# Patient Record
Sex: Male | Born: 1960 | Race: Black or African American | Hispanic: No | Marital: Married | Smoking: Current every day smoker
Health system: Southern US, Community
[De-identification: ages and names within clinical notes are randomized; demographics above are authoritative.]

## PROBLEM LIST (undated history)

## (undated) DIAGNOSIS — N400 Enlarged prostate without lower urinary tract symptoms: Secondary | ICD-10-CM

## (undated) DIAGNOSIS — G43909 Migraine, unspecified, not intractable, without status migrainosus: Secondary | ICD-10-CM

## (undated) DIAGNOSIS — I219 Acute myocardial infarction, unspecified: Secondary | ICD-10-CM

## (undated) DIAGNOSIS — R569 Unspecified convulsions: Secondary | ICD-10-CM

## (undated) DIAGNOSIS — Z789 Other specified health status: Secondary | ICD-10-CM

## (undated) DIAGNOSIS — I519 Heart disease, unspecified: Secondary | ICD-10-CM

## (undated) DIAGNOSIS — F1010 Alcohol abuse, uncomplicated: Secondary | ICD-10-CM

## (undated) HISTORY — PX: CARDIAC CATHETERIZATION: SHX172

---

## 1998-03-09 ENCOUNTER — Emergency Department (HOSPITAL_COMMUNITY): Admission: EM | Admit: 1998-03-09 | Discharge: 1998-03-09 | Payer: Self-pay | Admitting: Emergency Medicine

## 1999-01-05 ENCOUNTER — Emergency Department (HOSPITAL_COMMUNITY): Admission: EM | Admit: 1999-01-05 | Discharge: 1999-01-05 | Payer: Self-pay | Admitting: Emergency Medicine

## 2000-03-29 ENCOUNTER — Emergency Department (HOSPITAL_COMMUNITY): Admission: EM | Admit: 2000-03-29 | Discharge: 2000-03-29 | Payer: Self-pay

## 2000-03-31 ENCOUNTER — Emergency Department (HOSPITAL_COMMUNITY): Admission: EM | Admit: 2000-03-31 | Discharge: 2000-03-31 | Payer: Self-pay | Admitting: Emergency Medicine

## 2000-07-27 ENCOUNTER — Encounter: Payer: Self-pay | Admitting: Emergency Medicine

## 2000-07-27 ENCOUNTER — Emergency Department (HOSPITAL_COMMUNITY): Admission: EM | Admit: 2000-07-27 | Discharge: 2000-07-27 | Payer: Self-pay | Admitting: Emergency Medicine

## 2001-03-25 ENCOUNTER — Ambulatory Visit (HOSPITAL_COMMUNITY): Admission: RE | Admit: 2001-03-25 | Discharge: 2001-03-25 | Payer: Self-pay | Admitting: Infectious Diseases

## 2001-03-25 ENCOUNTER — Encounter: Payer: Self-pay | Admitting: Infectious Diseases

## 2002-04-24 ENCOUNTER — Emergency Department (HOSPITAL_COMMUNITY): Admission: EM | Admit: 2002-04-24 | Discharge: 2002-04-24 | Payer: Self-pay | Admitting: Emergency Medicine

## 2002-07-22 ENCOUNTER — Emergency Department (HOSPITAL_COMMUNITY): Admission: EM | Admit: 2002-07-22 | Discharge: 2002-07-22 | Payer: Self-pay | Admitting: Emergency Medicine

## 2002-10-27 ENCOUNTER — Emergency Department (HOSPITAL_COMMUNITY): Admission: EM | Admit: 2002-10-27 | Discharge: 2002-10-27 | Payer: Self-pay | Admitting: Emergency Medicine

## 2003-07-25 ENCOUNTER — Emergency Department (HOSPITAL_COMMUNITY): Admission: EM | Admit: 2003-07-25 | Discharge: 2003-07-25 | Payer: Self-pay | Admitting: Emergency Medicine

## 2004-04-08 ENCOUNTER — Emergency Department (HOSPITAL_COMMUNITY): Admission: EM | Admit: 2004-04-08 | Discharge: 2004-04-08 | Payer: Self-pay | Admitting: Emergency Medicine

## 2004-10-23 ENCOUNTER — Emergency Department (HOSPITAL_COMMUNITY): Admission: EM | Admit: 2004-10-23 | Discharge: 2004-10-23 | Payer: Self-pay | Admitting: Emergency Medicine

## 2005-07-21 ENCOUNTER — Emergency Department (HOSPITAL_COMMUNITY): Admission: EM | Admit: 2005-07-21 | Discharge: 2005-07-21 | Payer: Self-pay | Admitting: Emergency Medicine

## 2005-08-28 ENCOUNTER — Inpatient Hospital Stay (HOSPITAL_COMMUNITY): Admission: EM | Admit: 2005-08-28 | Discharge: 2005-08-29 | Payer: Self-pay | Admitting: Emergency Medicine

## 2005-08-30 ENCOUNTER — Emergency Department (HOSPITAL_COMMUNITY): Admission: EM | Admit: 2005-08-30 | Discharge: 2005-08-30 | Payer: Self-pay | Admitting: Emergency Medicine

## 2005-10-05 ENCOUNTER — Emergency Department (HOSPITAL_COMMUNITY): Admission: EM | Admit: 2005-10-05 | Discharge: 2005-10-05 | Payer: Self-pay | Admitting: Emergency Medicine

## 2005-12-02 ENCOUNTER — Ambulatory Visit: Payer: Self-pay | Admitting: Nurse Practitioner

## 2007-01-12 ENCOUNTER — Ambulatory Visit: Payer: Self-pay | Admitting: Nurse Practitioner

## 2007-01-25 ENCOUNTER — Ambulatory Visit: Payer: Self-pay | Admitting: Nurse Practitioner

## 2007-02-22 ENCOUNTER — Emergency Department (HOSPITAL_COMMUNITY): Admission: EM | Admit: 2007-02-22 | Discharge: 2007-02-22 | Payer: Self-pay | Admitting: Emergency Medicine

## 2007-11-29 ENCOUNTER — Emergency Department (HOSPITAL_COMMUNITY): Admission: EM | Admit: 2007-11-29 | Discharge: 2007-11-29 | Payer: Self-pay | Admitting: Emergency Medicine

## 2009-01-14 ENCOUNTER — Emergency Department (HOSPITAL_COMMUNITY): Admission: EM | Admit: 2009-01-14 | Discharge: 2009-01-14 | Payer: Self-pay | Admitting: Emergency Medicine

## 2009-01-21 ENCOUNTER — Encounter (INDEPENDENT_AMBULATORY_CARE_PROVIDER_SITE_OTHER): Payer: Self-pay | Admitting: Internal Medicine

## 2009-01-21 ENCOUNTER — Ambulatory Visit: Payer: Self-pay | Admitting: Internal Medicine

## 2009-01-21 LAB — CONVERTED CEMR LAB
Basophils Absolute: 0 10*3/uL (ref 0.0–0.1)
Basophils Relative: 0 % (ref 0–1)
Eosinophils Absolute: 0.2 10*3/uL (ref 0.0–0.7)
Eosinophils Relative: 2 % (ref 0–5)
HCT: 45.1 % (ref 39.0–52.0)
Hemoglobin: 15.6 g/dL (ref 13.0–17.0)
Lymphocytes Relative: 37 % (ref 12–46)
Lymphs Abs: 2.8 10*3/uL (ref 0.7–4.0)
MCHC: 34.6 g/dL (ref 30.0–36.0)
MCV: 91.5 fL (ref 78.0–100.0)
Monocytes Absolute: 0.4 10*3/uL (ref 0.1–1.0)
Monocytes Relative: 6 % (ref 3–12)
Neutro Abs: 4.3 10*3/uL (ref 1.7–7.7)
Neutrophils Relative %: 55 % (ref 43–77)
Platelets: 194 10*3/uL (ref 150–400)
RBC: 4.93 M/uL (ref 4.22–5.81)
RDW: 15 % (ref 11.5–15.5)
WBC: 7.7 10*3/uL (ref 4.0–10.5)

## 2009-01-24 ENCOUNTER — Ambulatory Visit: Payer: Self-pay | Admitting: *Deleted

## 2009-02-20 ENCOUNTER — Ambulatory Visit: Payer: Self-pay | Admitting: Internal Medicine

## 2010-04-09 ENCOUNTER — Emergency Department (HOSPITAL_COMMUNITY): Admission: EM | Admit: 2010-04-09 | Discharge: 2010-04-09 | Payer: Self-pay | Admitting: Emergency Medicine

## 2012-12-18 ENCOUNTER — Encounter (HOSPITAL_COMMUNITY): Payer: Self-pay | Admitting: *Deleted

## 2012-12-18 ENCOUNTER — Emergency Department (HOSPITAL_COMMUNITY): Payer: Self-pay

## 2012-12-18 ENCOUNTER — Emergency Department (HOSPITAL_COMMUNITY)
Admission: EM | Admit: 2012-12-18 | Discharge: 2012-12-18 | Disposition: A | Discharge status: Home / Self Care | Payer: Self-pay | Attending: Emergency Medicine | Admitting: Emergency Medicine

## 2012-12-18 DIAGNOSIS — S61209A Unspecified open wound of unspecified finger without damage to nail, initial encounter: Secondary | ICD-10-CM | POA: Insufficient documentation

## 2012-12-18 DIAGNOSIS — W01119A Fall on same level from slipping, tripping and stumbling with subsequent striking against unspecified sharp object, initial encounter: Secondary | ICD-10-CM | POA: Insufficient documentation

## 2012-12-18 DIAGNOSIS — Y929 Unspecified place or not applicable: Secondary | ICD-10-CM | POA: Insufficient documentation

## 2012-12-18 DIAGNOSIS — IMO0002 Reserved for concepts with insufficient information to code with codable children: Secondary | ICD-10-CM

## 2012-12-18 DIAGNOSIS — F17200 Nicotine dependence, unspecified, uncomplicated: Secondary | ICD-10-CM | POA: Insufficient documentation

## 2012-12-18 DIAGNOSIS — W268XXA Contact with other sharp object(s), not elsewhere classified, initial encounter: Secondary | ICD-10-CM | POA: Insufficient documentation

## 2012-12-18 DIAGNOSIS — Y9329 Activity, other involving ice and snow: Secondary | ICD-10-CM | POA: Insufficient documentation

## 2012-12-18 NOTE — ED Provider Notes (Signed)
History     CSN: [id number]  Arrival date & time 12/18/12  0424   First MD Initiated Contact with Patient 12/18/12 [phone]      Chief Complaint  Patient presents with  . Laceration    (Consider location/radiation/quality/duration/timing/severity/associated sxs/prior treatment) HPI SUBJECTIVE:  52 y.o. male sustained laceration of finger 10 hours ago. Nature of injury: patient was carrying an amplifier. He slipped on the ice and cut lis Right middle finger on the metal edge. Tetanus vaccination status reviewed: last tetanus booster within 10 years, tetanus re-vaccination not indicated. Patient xray is negative.    History reviewed. No pertinent past medical history.  History reviewed. No pertinent past surgical history.  No family history on file.  History  Substance Use Topics  . Smoking status: Current Every Day Smoker  . Smokeless tobacco: Not on file  . Alcohol Use: Yes      Review of Systems Ten systems reviewed and are negative for acute change, except as noted in the HPI.   Allergies  Penicillins  Home Medications   Current Outpatient Rx  Name  Route  Sig  Dispense  Refill  . acetaminophen (TYLENOL) 500 MG tablet   Oral   Take 500 mg by mouth every 6 (six) hours as needed for pain.           BP 117/78  Pulse 96  Temp(Src) 97.6 F (36.4 C) (Oral)  Resp 18  SpO2 98%  Physical Exam Nursing note and vitals reviewed. Patient appears well, vitals are normal. Laceration 1 cm noted.  Description: clean wound edges, no foreign bodies. Neurovascular and tendon structures are intact. HENT:  Head: Normocephalic and atraumatic.  Eyes: Conjunctivae normal are normal. No scleral icterus.  Neck: Normal range of motion. Neck supple.  Cardiovascular: Normal rate, regular rhythm and normal heart sounds.   Pulmonary/Chest: Effort normal and breath sounds normal. No respiratory distress.  Abdominal: Soft. There is no tenderness.  Musculoskeletal: He exhibits no  edema.  Neurological: He is alert.  Skin: Skin is warm and dry. He is not diaphoretic.  Psychiatric: His behavior is normal.      ED Course  Procedures (including critical care time) LACERATION REPAIR Performed by: Arthor Captain Authorized by: Arthor Captain Consent: Verbal consent obtained. Risks and benefits: risks, benefits and alternatives were discussed Consent given by: patient Patient identity confirmed: provided demographic data Prepped and Draped in normal sterile fashion Wound explored  Laceration Location: R middle finger, palmar surface at PIP  Laceration Length: 1 cm  No Foreign Bodies seen or palpated  Anesthesia: local infiltration  Local anesthetic: lidocaine 2 % w/o epinephrine  Anesthetic total: 2 ml  Irrigation method: syringe Amount of cleaning: standard  Skin closure: 5.0 prolene  Number of sutures: 3  Technique: SI, HM  Patient tolerance: Patient tolerated the procedure well with no immediate complications.  Labs Reviewed - No data to display Dg Finger Middle Right  12/18/2012  *RADIOLOGY REPORT*  Clinical Data: Status post fall; cut right middle finger on amplifier.  RIGHT MIDDLE FINGER 2+V  Comparison: None.  Findings: There is no evidence of fracture or dislocation.  The third finger is grossly unremarkable in appearance; the known soft tissue laceration is not well characterized on radiograph. Visualized joint spaces are preserved.  The carpal rows appear grossly intact, demonstrating normal alignment.  No radiopaque foreign bodies are seen.  IMPRESSION: No evidence of fracture or dislocation.  No radiopaque foreign bodies seen.   Original Report Authenticated By: Tinnie Gens  Cherly Hensen, M.D.      1. Laceration       MDM  Tdap booster given.Pressure irrigation performed. Laceration occurred < 12 hours prior to repair which was well tolerated. Pt has no co morbidities to effect normal wound healing. Discussed suture home care w pt and answered  questions. Pt to f-u for wound check and suture removal in 7 days. Pt is hemodynamically stable w no complaints prior to dc.           Arthor Captain, PA-C 12/18/12 [phone]

## 2012-12-18 NOTE — ED Notes (Signed)
Rt middle finger laceration.  The pt fell on the ice approx 2000.  He smells of etoh.  Bleeding controlled

## 2012-12-18 NOTE — ED Notes (Signed)
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 Sharp edge of amplifier caused a laceration to the R Middle finger. Pt states that no other injury occurred. Pt is Alert and Oriented x 4. NAD.

## 2012-12-18 NOTE — ED Provider Notes (Signed)
Medical screening examination/treatment/procedure(s) were performed by non-physician practitioner and as supervising physician I was immediately available for consultation/collaboration.  Jasmine Awe, MD 12/18/12 [phone]

## 2012-12-27 ENCOUNTER — Emergency Department (HOSPITAL_COMMUNITY)
Admission: EM | Admit: 2012-12-27 | Discharge: 2012-12-27 | Disposition: A | Discharge status: Home / Self Care | Payer: Self-pay | Attending: Emergency Medicine | Admitting: Emergency Medicine

## 2012-12-27 ENCOUNTER — Encounter (HOSPITAL_COMMUNITY): Payer: Self-pay | Admitting: Nurse Practitioner

## 2012-12-27 DIAGNOSIS — F17200 Nicotine dependence, unspecified, uncomplicated: Secondary | ICD-10-CM | POA: Insufficient documentation

## 2012-12-27 DIAGNOSIS — Z4802 Encounter for removal of sutures: Secondary | ICD-10-CM | POA: Insufficient documentation

## 2012-12-27 NOTE — ED Provider Notes (Signed)
Medical screening examination/treatment/procedure(s) were performed by non-physician practitioner and as supervising physician I was immediately available for consultation/collaboration.  Martha K Linker, MD 12/27/12 1615 

## 2012-12-27 NOTE — ED Notes (Signed)
Pt here for suture removal to anterior middle finger, placed here last Sunday. Healing well no complaints

## 2012-12-27 NOTE — Discharge Instructions (Signed)
Suture Removal Your caregiver has removed your sutures today. If skin adhesive strips were applied at the time of suturing, or applied following removal of the sutures today, they will begin to peel off in a couple more days. If skin adhesive strips remain after 14 days, they may be removed. HOME CARE INSTRUCTIONS    Change any bandages (dressings) at least once a day or as directed by your caregiver. If the bandage sticks, soak it off with warm, soapy water.   Wash the area with soap and water to remove all the cream or ointment (if you were instructed to use any) 2 times a day. Rinse off the soap and pat the area dry with a clean towel.   Reapply cream or ointment as directed by your caregiver. This will help prevent infection and keep the bandage from sticking.   Keep the wound area dry and clean. If the bandage becomes wet, dirty, or develops a bad smell, change it as soon as possible.   Only take over-the-counter or prescription medicines for pain, discomfort, or fever as directed by your caregiver.   Use sunscreen when out in the sun. New scars become sunburned easily.   Return to your caregivers office in in 7 days or as directed to have your sutures removed.  You may need a tetanus shot if:  You cannot remember when you had your last tetanus shot.   You have never had a tetanus shot.   The injury broke your skin.  If you got a tetanus shot, your arm may swell, get red, and feel warm to the touch. This is common and not a problem. If you need a tetanus shot and you choose not to have one, there is a rare chance of getting tetanus. Sickness from tetanus can be serious. SEEK IMMEDIATE MEDICAL CARE IF:    There is redness, swelling, or increasing pain in the wound.   Pus is coming from the wound.   An unexplained oral temperature above 102 F (38.9 C) develops.   You notice a bad smell coming from the wound or dressing.   The wound breaks open (edges not staying together)  after sutures have been removed.  Document Released: 07/14/2001 Document Revised: 01/11/2012 Document Reviewed: 09/12/2007 [employer] Patient Information 2013 [employer].    

## 2012-12-27 NOTE — ED Notes (Signed)
Pt given discharge paperwork; no additional questions by pt about discharge; VSS; e-signature obtained; 

## 2012-12-27 NOTE — ED Provider Notes (Signed)
History    This chart was scribed for non-physician practitioner working with William Chick, MD by Gerlean Ren, ED Scribe. This patient was seen in room TR07C/TR07C and the patient's care was started at 3:55 PM.     CSN: [id number]  Arrival date & time 12/27/12  1507   First MD Initiated Contact with Patient 12/27/12 1527      Chief Complaint  Patient presents with  . Suture / Staple Removal     The history is provided by the patient. No language interpreter was used.  William Baxter is a 52 y.o. male who presents to the Emergency Department for suture removal from right middle finger placed 9 days ago here for laceration sustained after slipping on ice.  Pt reports site has been healing well and pain has resolved.  Pt has no further complaints.   History reviewed. No pertinent past medical history.  History reviewed. No pertinent past surgical history.  History reviewed. No pertinent family history.  History  Substance Use Topics  . Smoking status: Current Every Day Smoker  . Smokeless tobacco: Not on file  . Alcohol Use: Yes      Review of Systems  Skin:       Suture removal  All other systems reviewed and are negative.    Allergies  Penicillins  Home Medications   Current Outpatient Rx  Name  Route  Sig  Dispense  Refill  . loratadine (CLARITIN) 10 MG tablet   Oral   Take 10 mg by mouth daily as needed for allergies. Takes March -June           BP 146/95  Pulse 104  Temp(Src) 99 F (37.2 C) (Oral)  Resp 17  SpO2 97%  Physical Exam  Nursing note and vitals reviewed. Constitutional: He is oriented to person, place, and time. He appears well-developed and well-nourished. No distress.  HENT:  Head: Normocephalic and atraumatic.  Eyes: EOM are normal.  Neck: Neck supple. No tracheal deviation present.  Cardiovascular: Normal rate.   Pulmonary/Chest: Effort normal. No respiratory distress.  Musculoskeletal: Normal range of motion.  3 sutures  on right middle finger palmar aspect overlying PIP joint, no signs of infection, normal ROM, non-tender on exam  Neurological: He is alert and oriented to person, place, and time.  Skin: Skin is warm and dry.  Psychiatric: He has a normal mood and affect. His behavior is normal.    ED Course  Procedures (including critical care time) DIAGNOSTIC STUDIES: Oxygen Saturation is 97% on room air, adequate by my interpretation.    COORDINATION OF CARE: 3:56 PM- Patient informed of clinical course to remove sutures, understands medical decision-making process, and agrees with plan.  No diagnosis found.  SUTURE REMOVAL Performed by: Rudolpho Sevin, PA-C under supervision of erTRAN,Jenascia Bumpass   Consent: Verbal consent obtained. Patient identity confirmed: provided demographic data Time out: Immediately prior to procedure a "time out" was called to verify the correct patient, procedure, equipment, support staff and site/side marked as required.  Location details: R middle finger, volar aspect  Wound Appearance: clean  Sutures Removed: 3  Facility: sutures placed in this facility Patient tolerance: Patient tolerated the procedure well with no immediate complications.     MDM  BP 146/95  Pulse 104  Temp(Src) 99 F (37.2 C) (Oral)  Resp 17  SpO2 97%  1. Sutures removal, R middle finger  I personally performed the services described in this documentation, which was scribed in my presence. The  recorded information has been reviewed and is accurate.         Fayrene Helper, PA-C 12/27/12 1606

## 2013-05-27 ENCOUNTER — Emergency Department (HOSPITAL_COMMUNITY): Payer: Self-pay

## 2013-05-27 ENCOUNTER — Emergency Department (HOSPITAL_COMMUNITY)
Admission: EM | Admit: 2013-05-27 | Discharge: 2013-05-27 | Disposition: A | Discharge status: Home / Self Care | Payer: Self-pay | Attending: Emergency Medicine | Admitting: Emergency Medicine

## 2013-05-27 ENCOUNTER — Encounter (HOSPITAL_COMMUNITY): Payer: Self-pay | Admitting: Emergency Medicine

## 2013-05-27 DIAGNOSIS — IMO0002 Reserved for concepts with insufficient information to code with codable children: Secondary | ICD-10-CM | POA: Insufficient documentation

## 2013-05-27 DIAGNOSIS — F17200 Nicotine dependence, unspecified, uncomplicated: Secondary | ICD-10-CM | POA: Insufficient documentation

## 2013-05-27 DIAGNOSIS — R509 Fever, unspecified: Secondary | ICD-10-CM | POA: Insufficient documentation

## 2013-05-27 DIAGNOSIS — R062 Wheezing: Secondary | ICD-10-CM | POA: Insufficient documentation

## 2013-05-27 DIAGNOSIS — R52 Pain, unspecified: Secondary | ICD-10-CM | POA: Insufficient documentation

## 2013-05-27 DIAGNOSIS — J3489 Other specified disorders of nose and nasal sinuses: Secondary | ICD-10-CM | POA: Insufficient documentation

## 2013-05-27 DIAGNOSIS — Z880 Allergy status to penicillin: Secondary | ICD-10-CM | POA: Insufficient documentation

## 2013-05-27 DIAGNOSIS — Z79899 Other long term (current) drug therapy: Secondary | ICD-10-CM | POA: Insufficient documentation

## 2013-05-27 DIAGNOSIS — J209 Acute bronchitis, unspecified: Secondary | ICD-10-CM | POA: Insufficient documentation

## 2013-05-27 MED ORDER — ALBUTEROL SULFATE HFA 108 (90 BASE) MCG/ACT IN AERS: 2.0000 | INHALATION_SPRAY | RESPIRATORY_TRACT | Status: DC | PRN

## 2013-05-27 MED ORDER — DOXYCYCLINE HYCLATE 100 MG PO CAPS: 100.0000 mg | ORAL_CAPSULE | Freq: Two times a day (BID) | ORAL | Status: DC

## 2013-05-27 MED ORDER — PREDNISONE 20 MG PO TABS: ORAL_TABLET | ORAL | Status: DC

## 2013-05-27 NOTE — ED Provider Notes (Signed)
CSN: [id number]     Arrival date & time 05/27/13  1936 History     First MD Initiated Contact with Patient 05/27/13 2056     Chief Complaint  Patient presents with  . Cough  . Fever  . Generalized Body Aches   (Consider location/radiation/quality/duration/timing/severity/associated sxs/prior Treatment) HPI This 52 year old male has a 3 week history of cough, he is a smoker, he has not used inhalers in the past, he states for the last 3 weeks he feels like he is coughing wheezing and has nasal congestion and scratchy throat as well, he is no fever, no confusion, no rash, no chest pain, no abdominal pain, no vomiting, he has had occasional loose stools which have been nonbloody, since he has not gotten better after 3 weeks he came to the ED for evaluation, he is not short of breath at rest, there is no treatment prior to arrival. History reviewed. No pertinent past medical history. History reviewed. No pertinent past surgical history. No family history on file. History  Substance Use Topics  . Smoking status: Current Every Day Smoker  . Smokeless tobacco: Not on file  . Alcohol Use: Yes    Review of Systems 10 Systems reviewed and are negative for acute change except as noted in the HPI. Allergies  Penicillins  Home Medications   Current Outpatient Rx  Name  Route  Sig  Dispense  Refill  . albuterol (PROVENTIL HFA;VENTOLIN HFA) 108 (90 BASE) MCG/ACT inhaler   Inhalation   Inhale 2 puffs into the lungs every 2 (two) hours as needed for wheezing or shortness of breath (cough).   1 Inhaler   0   . doxycycline (VIBRAMYCIN) 100 MG capsule   Oral   Take 1 capsule (100 mg total) by mouth 2 (two) times daily. One po bid x 7 days   14 capsule   0   . predniSONE (DELTASONE) 20 MG tablet      3 tabs po day one, then 2 po daily x 4 days   11 tablet   0    BP 112/68  Pulse 109  Temp(Src) 98.5 F (36.9 C) (Oral)  Resp 20  Ht 5\' 10"  (1.778 m)  Wt 185 lb (83.915 kg)  BMI  26.54 kg/m2  SpO2 97% Physical Exam  Nursing note and vitals reviewed. Constitutional:  Awake, alert, nontoxic appearance.  HENT:  Head: Atraumatic.  Eyes: Right eye exhibits no discharge. Left eye exhibits no discharge.  Neck: Neck supple.  Cardiovascular: Normal rate and regular rhythm.   No murmur heard. Pulmonary/Chest: Effort normal. No respiratory distress. He has wheezes. He has no rales. He exhibits no tenderness.  Patient speaks full sentences with normal pulse oximetry in room air 97%, he does however have diffuse inspiratory and expiratory wheezes without retractions or accessory muscle usage  Abdominal: Soft. There is no tenderness. There is no rebound.  Musculoskeletal: He exhibits no edema and no tenderness.  Baseline ROM, no obvious new focal weakness.  Neurological:  Mental status and motor strength appears baseline for patient and situation.  Skin: No rash noted.  Psychiatric: He has a normal mood and affect.    ED Course  Patient informed of clinical course, understand medical decision-making process, and agree with plan. Procedures (including critical care time)  Labs Reviewed - No data to display Dg Chest 2 View  05/27/2013   *RADIOLOGY REPORT*  Clinical Data: Cough, fever, generalized body aches, history smoking  CHEST - 2 VIEW  Comparison: 10/05/2005;  08/28/9005; chest CT - 08/28/2005  Findings:  Grossly unchanged cardiac silhouette and mediastinal contours. The lungs are mildly hyperexpanded with flattening of the diaphragms and mild diffuse thickening of the interstitium.  No focal airspace opacity.  No pleural effusion or pneumothorax.  No evidence of edema.  Unchanged bones including old/healed eighth right-sided posterior lateral rib fracture.  IMPRESSION: Mild lung hyperexpansion and bronchitic change without acute cardiopulmonary disease.   Original Report Authenticated By: Tacey Ruiz, MD   1. Acute bronchitis with bronchospasm     MDM  I doubt any  other EMC precluding discharge at this time including, but not necessarily limited to the following:sepsis.  Hurman Horn, MD 05/28/13 1201

## 2013-05-27 NOTE — ED Notes (Signed)
Pt c/o cough, fever, body aches, chills, sore throat x 3 weeks. Per pt green sputum, decreased appetite. PWD

## 2013-10-11 ENCOUNTER — Emergency Department (HOSPITAL_COMMUNITY)
Admission: EM | Admit: 2013-10-11 | Discharge: 2013-10-11 | Disposition: A | Discharge status: Home / Self Care | Payer: Non-veteran care | Attending: Emergency Medicine | Admitting: Emergency Medicine

## 2013-10-11 ENCOUNTER — Encounter (HOSPITAL_COMMUNITY): Payer: Self-pay | Admitting: Emergency Medicine

## 2013-10-11 DIAGNOSIS — M25519 Pain in unspecified shoulder: Secondary | ICD-10-CM | POA: Insufficient documentation

## 2013-10-11 DIAGNOSIS — M542 Cervicalgia: Secondary | ICD-10-CM | POA: Insufficient documentation

## 2013-10-11 DIAGNOSIS — R52 Pain, unspecified: Secondary | ICD-10-CM | POA: Insufficient documentation

## 2013-10-11 DIAGNOSIS — Z880 Allergy status to penicillin: Secondary | ICD-10-CM | POA: Insufficient documentation

## 2013-10-11 DIAGNOSIS — M25512 Pain in left shoulder: Secondary | ICD-10-CM

## 2013-10-11 DIAGNOSIS — F17200 Nicotine dependence, unspecified, uncomplicated: Secondary | ICD-10-CM | POA: Insufficient documentation

## 2013-10-11 MED ORDER — HYDROCODONE-ACETAMINOPHEN 5-325 MG PO TABS
1.0000 | ORAL_TABLET | Freq: Once | ORAL | Status: AC
  Administered 2013-10-11: 1 via ORAL
  Filled 2013-10-11: qty 1

## 2013-10-11 MED ORDER — CYCLOBENZAPRINE HCL 10 MG PO TABS: 10.0000 mg | ORAL_TABLET | Freq: Three times a day (TID) | ORAL | Status: DC | PRN

## 2013-10-11 MED ORDER — PREDNISONE 20 MG PO TABS
60.0000 mg | ORAL_TABLET | Freq: Once | ORAL | Status: AC
  Administered 2013-10-11: 60 mg via ORAL
  Filled 2013-10-11: qty 3

## 2013-10-11 MED ORDER — CYCLOBENZAPRINE HCL 10 MG PO TABS
5.0000 mg | ORAL_TABLET | Freq: Once | ORAL | Status: AC
Start: 2013-10-11 — End: 2013-10-11
  Administered 2013-10-11: 5 mg via ORAL
  Filled 2013-10-11: qty 1

## 2013-10-11 MED ORDER — PREDNISONE 10 MG PO TABS: 20.0000 mg | ORAL_TABLET | Freq: Every day | ORAL | Status: DC

## 2013-10-11 MED ORDER — MELOXICAM 15 MG PO TABS: 15.0000 mg | ORAL_TABLET | Freq: Every day | ORAL | Status: DC

## 2013-10-11 NOTE — ED Provider Notes (Signed)
CSN: [id number]     Arrival date & time 10/11/13  4098 History  This chart was scribed for non-physician practitioner Ivonne Andrew, PA-C working with Raeford Razor, MD by Danella Maiers, ED Scribe. This patient was seen in room WTR6/WTR6 and the patient's care was started at 8:14 PM.   Chief Complaint  Patient presents with  . Shoulder Pain   The history is provided by the patient. No language interpreter was used.   HPI Comments: William Baxter is a 52 y.o. male who presents to the Emergency Department complaining of waxing and waning, aching left shoulder and left-sided neck pain since two weeks ago that radiates down his posterior left arm. He states sometimes it worsens and becomes a throbbing pain. He states it feels like his shoulder is out of "socket". Denies any popping or clicking. He denies any injuries or trauma, doing repetitive movements, throwing something, heavy lifting. He once took an 800 mg ibuprofen with some relief. He has been taking tylenol with no relief. He states holding his shoulder in certain positions makes the pain feel better. 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 No other history of shoulder or neck injuries. He denies back pain. He has no allergies to medications. He is otherwise healthy. He is not on any medications currently. No other aggravating or other associates. No weakness or numbness in the hand.    History reviewed. No pertinent past medical history. History reviewed. No pertinent past surgical history. History reviewed. No pertinent family history. History  Substance Use Topics  . Smoking status: Current Every Day Smoker  . Smokeless tobacco: Not on file  . Alcohol Use: Yes    Review of Systems  Musculoskeletal: Positive for arthralgias (left shoulder).  Neurological: Negative for weakness and numbness.  All other systems reviewed and are negative.    Allergies   Penicillins  Home Medications   Current Outpatient Rx  Name  Route  Sig  Dispense  Refill  . albuterol (PROVENTIL HFA;VENTOLIN HFA) 108 (90 BASE) MCG/ACT inhaler   Inhalation   Inhale 2 puffs into the lungs every 2 (two) hours as needed for wheezing or shortness of breath (cough).   1 Inhaler   0   . doxycycline (VIBRAMYCIN) 100 MG capsule   Oral   Take 1 capsule (100 mg total) by mouth 2 (two) times daily. One po bid x 7 days   14 capsule   0   . predniSONE (DELTASONE) 20 MG tablet      3 tabs po day one, then 2 po daily x 4 days   11 tablet   0    BP 123/69  Pulse 91  Temp(Src) 98.2 F (36.8 C) (Oral)  Resp 18  SpO2 98% Physical Exam  Nursing note and vitals reviewed. Constitutional: He is oriented to person, place, and time. He appears well-developed and well-nourished. No distress.  HENT:  Head: Normocephalic and atraumatic.  Eyes: EOM are normal.  Neck: Neck supple. No tracheal deviation present.  Cardiovascular: Normal rate.   Pulmonary/Chest: Effort normal. No respiratory distress.  Musculoskeletal: Normal range of motion.  Good radial pulses, good grip strength. No swelling or deformity of the extremity. Some tenderness over the trapezius area and into the neck. No significant midline or cervical pain, no deformity.   Neurological: He is alert and oriented to person, place, and time.  Skin: Skin is warm and dry.  Psychiatric:  He has a normal mood and affect. His behavior is normal.    ED Course  Procedures     DIAGNOSTIC STUDIES: Oxygen Saturation is 98% on RA, normal by my interpretation.    COORDINATION OF CARE: 8:33 PM- patient seen and evaluated. Patient appears in some discomfort but no acute distress. No history of significant injury, strenuous activity or repetitive motions. No indications for x-rays. Discussed treatment plan with pt. Pt agrees to plan.      MDM   1. Shoulder pain, acute, left      I personally performed the  services described in this documentation, which was scribed in my presence. The recorded information has been reviewed and is accurate.    Angus Seller, PA-C 10/11/13 2241

## 2013-10-11 NOTE — ED Notes (Signed)
Pt has a ride home.  

## 2013-10-11 NOTE — ED Notes (Signed)
Pt complains of left shoulder pain, no injury, he states that he can lift it but it hurts at a certain point

## 2013-10-14 NOTE — ED Provider Notes (Signed)
Medical screening examination/treatment/procedure(s) were performed by non-physician practitioner and as supervising physician I was immediately available for consultation/collaboration.  EKG Interpretation   None        Afshin Chrystal, MD 10/14/13 0752 

## 2013-12-15 ENCOUNTER — Emergency Department (HOSPITAL_COMMUNITY): Payer: Non-veteran care

## 2013-12-15 ENCOUNTER — Inpatient Hospital Stay (HOSPITAL_COMMUNITY)
Admission: EM | Admit: 2013-12-15 | Discharge: 2013-12-19 | DRG: 282 | Disposition: A | Discharge status: Home / Self Care | Payer: Non-veteran care | Attending: Cardiovascular Disease | Admitting: Cardiovascular Disease

## 2013-12-15 ENCOUNTER — Encounter (HOSPITAL_COMMUNITY): Payer: Self-pay | Admitting: Emergency Medicine

## 2013-12-15 DIAGNOSIS — I249 Acute ischemic heart disease, unspecified: Secondary | ICD-10-CM | POA: Diagnosis present

## 2013-12-15 DIAGNOSIS — Z7902 Long term (current) use of antithrombotics/antiplatelets: Secondary | ICD-10-CM

## 2013-12-15 DIAGNOSIS — I214 Non-ST elevation (NSTEMI) myocardial infarction: Secondary | ICD-10-CM

## 2013-12-15 DIAGNOSIS — I2109 ST elevation (STEMI) myocardial infarction involving other coronary artery of anterior wall: Principal | ICD-10-CM | POA: Diagnosis present

## 2013-12-15 DIAGNOSIS — I2510 Atherosclerotic heart disease of native coronary artery without angina pectoris: Secondary | ICD-10-CM | POA: Diagnosis present

## 2013-12-15 DIAGNOSIS — I252 Old myocardial infarction: Secondary | ICD-10-CM

## 2013-12-15 DIAGNOSIS — Z79899 Other long term (current) drug therapy: Secondary | ICD-10-CM

## 2013-12-15 DIAGNOSIS — Z8249 Family history of ischemic heart disease and other diseases of the circulatory system: Secondary | ICD-10-CM

## 2013-12-15 DIAGNOSIS — Z7982 Long term (current) use of aspirin: Secondary | ICD-10-CM

## 2013-12-15 DIAGNOSIS — F17200 Nicotine dependence, unspecified, uncomplicated: Secondary | ICD-10-CM | POA: Diagnosis present

## 2013-12-15 HISTORY — DX: Other specified health status: Z78.9

## 2013-12-15 LAB — BASIC METABOLIC PANEL
BUN: 7 mg/dL (ref 6–23)
CO2: 25 mEq/L (ref 19–32)
Calcium: 10.1 mg/dL (ref 8.4–10.5)
Chloride: 97 mEq/L (ref 96–112)
Creatinine, Ser: 0.78 mg/dL (ref 0.50–1.35)
GFR calc Af Amer: >90 mL/min (ref >90)
GFR calc non Af Amer: >90 mL/min (ref >90)
Glucose, Bld: 99 mg/dL (ref 70–99)
Potassium: 3.8 mEq/L (ref 3.7–5.3)
SODIUM: 136 meq/L — AB (ref 137–147)

## 2013-12-15 LAB — CBC
HCT: 45.4 % (ref 39.0–52.0)
Hemoglobin: 15.5 g/dL (ref 13.0–17.0)
MCH: 31.3 pg (ref 26.0–34.0)
MCHC: 34.1 g/dL (ref 30.0–36.0)
MCV: 91.7 fL (ref 78.0–100.0)
Platelets: 150 10*3/uL (ref 150–400)
RBC: 4.95 MIL/uL (ref 4.22–5.81)
RDW: 14.2 % (ref 11.5–15.5)
WBC: 5.9 10*3/uL (ref 4.0–10.5)

## 2013-12-15 MED ORDER — ASPIRIN 81 MG PO CHEW
324.0000 mg | CHEWABLE_TABLET | Freq: Once | ORAL | Status: AC
  Administered 2013-12-15: 324 mg via ORAL
  Filled 2013-12-15: qty 4

## 2013-12-15 MED ORDER — NITROGLYCERIN 0.4 MG SL SUBL
0.4000 mg | SUBLINGUAL_TABLET | SUBLINGUAL | Status: DC | PRN
  Administered 2013-12-15 – 2013-12-16 (×2): 0.4 mg via SUBLINGUAL
  Filled 2013-12-15 (×2): qty 25

## 2013-12-15 NOTE — ED Notes (Signed)
Pt in with upper chest pain radiating to his mid-sternum x8 days, Left arm pain starting in November pt states he was prescribed medication for his arm pain but its not working. EKG completed in triage

## 2013-12-16 ENCOUNTER — Encounter (HOSPITAL_COMMUNITY): Payer: Self-pay

## 2013-12-16 DIAGNOSIS — I249 Acute ischemic heart disease, unspecified: Secondary | ICD-10-CM | POA: Diagnosis present

## 2013-12-16 LAB — CBC
HEMATOCRIT: 40.9 % (ref 39.0–52.0)
HEMOGLOBIN: 14.3 g/dL (ref 13.0–17.0)
MCH: 31.9 pg (ref 26.0–34.0)
MCHC: 35 g/dL (ref 30.0–36.0)
MCV: 91.3 fL (ref 78.0–100.0)
Platelets: 138 10*3/uL — ABNORMAL LOW (ref 150–400)
RBC: 4.48 MIL/uL (ref 4.22–5.81)
RDW: 14.3 % (ref 11.5–15.5)
WBC: 6 10*3/uL (ref 4.0–10.5)

## 2013-12-16 LAB — BASIC METABOLIC PANEL
BUN: 8 mg/dL (ref 6–23)
CO2: 26 mEq/L (ref 19–32)
CREATININE: 0.74 mg/dL (ref 0.50–1.35)
Calcium: 9.3 mg/dL (ref 8.4–10.5)
Chloride: 100 mEq/L (ref 96–112)
GFR calc Af Amer: >90 mL/min (ref >90)
GFR calc non Af Amer: >90 mL/min (ref >90)
GLUCOSE: 97 mg/dL (ref 70–99)
POTASSIUM: 4.4 meq/L (ref 3.7–5.3)
Sodium: 138 mEq/L (ref 137–147)

## 2013-12-16 LAB — LIPID PANEL
CHOL/HDL RATIO: 2.3 ratio
CHOLESTEROL: 176 mg/dL (ref 0–200)
HDL: 77 mg/dL (ref >39)
LDL Cholesterol: 90 mg/dL (ref 0–99)
TRIGLYCERIDES: 43 mg/dL (ref <150)
VLDL: 9 mg/dL (ref 0–40)

## 2013-12-16 LAB — TROPONIN I
TROPONIN I: 7 ng/mL — AB (ref <0.30)
Troponin I: >20 ng/mL (ref <0.30)
Troponin I: 9.51 ng/mL (ref <0.30)
Troponin I: 10.66 ng/mL (ref <0.30)

## 2013-12-16 LAB — PROTIME-INR
INR: 1.03 (ref 0.00–1.49)
Prothrombin Time: 13.3 seconds (ref 11.6–15.2)

## 2013-12-16 LAB — HEPARIN LEVEL (UNFRACTIONATED)
HEPARIN UNFRACTIONATED: 0.35 [IU]/mL (ref 0.30–0.70)
Heparin Unfractionated: 0.42 IU/mL (ref 0.30–0.70)

## 2013-12-16 LAB — MRSA PCR SCREENING: MRSA by PCR: NEGATIVE

## 2013-12-16 MED ORDER — ONDANSETRON HCL 4 MG/2ML IJ SOLN
4.0000 mg | Freq: Four times a day (QID) | INTRAMUSCULAR | Status: DC | PRN
Start: 2013-12-16 — End: 2013-12-19

## 2013-12-16 MED ORDER — SODIUM CHLORIDE 0.9 % IV SOLN
INTRAVENOUS | Status: DC
  Administered 2013-12-16: 02:00:00 via INTRAVENOUS

## 2013-12-16 MED ORDER — ASPIRIN 81 MG PO CHEW: 324.0000 mg | CHEWABLE_TABLET | ORAL | Status: AC

## 2013-12-16 MED ORDER — NITROGLYCERIN IN D5W 200-5 MCG/ML-% IV SOLN
5.0000 ug/min | INTRAVENOUS | Status: DC
  Administered 2013-12-16: 5 ug/min via INTRAVENOUS
  Filled 2013-12-16: qty 250

## 2013-12-16 MED ORDER — HEPARIN (PORCINE) IN NACL 100-0.45 UNIT/ML-% IJ SOLN
1200.0000 [IU]/h | INTRAMUSCULAR | Status: DC
  Administered 2013-12-16: 1100 [IU]/h via INTRAVENOUS
  Administered 2013-12-17 – 2013-12-18 (×2): 1200 [IU]/h via INTRAVENOUS
  Filled 2013-12-16 (×7): qty 250

## 2013-12-16 MED ORDER — ACETAMINOPHEN 325 MG PO TABS: 650.0000 mg | ORAL_TABLET | ORAL | Status: DC | PRN

## 2013-12-16 MED ORDER — ATORVASTATIN CALCIUM 80 MG PO TABS
80.0000 mg | ORAL_TABLET | Freq: Every day | ORAL | Status: DC
  Administered 2013-12-16 – 2013-12-18 (×3): 80 mg via ORAL
  Filled 2013-12-16 (×4): qty 1

## 2013-12-16 MED ORDER — HEPARIN BOLUS VIA INFUSION
4000.0000 [IU] | Freq: Once | INTRAVENOUS | Status: AC
  Administered 2013-12-16: 4000 [IU] via INTRAVENOUS
  Filled 2013-12-16: qty 4000

## 2013-12-16 MED ORDER — METOPROLOL TARTRATE 12.5 MG HALF TABLET
12.5000 mg | ORAL_TABLET | Freq: Two times a day (BID) | ORAL | Status: DC
  Administered 2013-12-16 – 2013-12-19 (×6): 12.5 mg via ORAL
  Filled 2013-12-16 (×10): qty 1

## 2013-12-16 MED ORDER — ASPIRIN 300 MG RE SUPP
300.0000 mg | RECTAL | Status: AC
  Filled 2013-12-16: qty 1

## 2013-12-16 MED ORDER — ALPRAZOLAM 0.25 MG PO TABS
0.2500 mg | ORAL_TABLET | Freq: Two times a day (BID) | ORAL | Status: DC | PRN
  Administered 2013-12-19: 0.25 mg via ORAL
  Filled 2013-12-16: qty 1

## 2013-12-16 MED ORDER — ASPIRIN EC 81 MG PO TBEC
81.0000 mg | DELAYED_RELEASE_TABLET | Freq: Every day | ORAL | Status: DC
  Administered 2013-12-17 – 2013-12-19 (×3): 81 mg via ORAL
  Filled 2013-12-16 (×3): qty 1

## 2013-12-16 NOTE — ED Provider Notes (Signed)
TIME SEEN: 10:00 AM  CHIEF COMPLAINT: Chest pain  HPI: Patient is a 53 y.o. male with a history of tobacco abuse and a family history of coronary artery disease who presents the emergency department with intermittent episodes of substernal chest pain he describes as feeling "like heartburn" and like a pressure pain. He reports the symptoms have been going on since the beginning of February. He reports that, at rest. He is not sure if it is exertional or pleuritic. He reports sometimes it radiates into his neck. He will have intermittent nausea, diaphoresis and dizziness but no shortness of breath. He denies a history of hypertension, diabetes or hyperlipidemia but states he has not been seen by a doctor in several years. He does not have a primary care physician. Denies a history of fever or cough. His father had CAD in his early 4450s and has had a CABG. Patient denies a history of PE or DVT, no recent prolonged immobilization, no recent fracture or surgery or trauma, no lower extremity swelling or pain.  ROS: See HPI Constitutional: no fever  Eyes: no drainage  ENT: no runny nose   Cardiovascular:  chest pain  Resp: no SOB  GI: no vomiting GU: no dysuria Integumentary: no rash  Allergy: no hives  Musculoskeletal: no leg swelling  Neurological: no slurred speech ROS otherwise negative  PAST MEDICAL HISTORY/PAST SURGICAL HISTORY:  History reviewed. No pertinent past medical history.  MEDICATIONS:  Prior to Admission medications   Medication Sig Start Date End Date Taking? Authorizing Provider  acetaminophen (TYLENOL) 500 MG tablet Take 1,000 mg by mouth every 6 (six) hours as needed for mild pain.    Yes Historical Provider, MD    ALLERGIES:  Allergies  Allergen Reactions  . Penicillins Nausea And Vomiting    SOCIAL HISTORY:  History  Substance Use Topics  . Smoking status: Current Every Day Smoker  . Smokeless tobacco: Not on file  . Alcohol Use: Yes    FAMILY  HISTORY: History reviewed. No pertinent family history.  EXAM: BP 126/78  Pulse 73  Temp(Src) 98.4 F (36.9 C) (Oral)  Resp 20  SpO2 100% CONSTITUTIONAL: Alert and oriented and responds appropriately to questions. Well-appearing; well-nourished HEAD: Normocephalic EYES: Conjunctivae clear, PERRL ENT: normal nose; no rhinorrhea; moist mucous membranes; pharynx without lesions noted NECK: Supple, no meningismus, no LAD  CARD: RRR; S1 and S2 appreciated; no murmurs, no clicks, no rubs, no gallops RESP: Normal chest excursion without splinting or tachypnea; breath sounds clear and equal bilaterally; no wheezes, no rhonchi, no rales,  ABD/GI: Normal bowel sounds; non-distended; soft, non-tender, no rebound, no guarding BACK:  The back appears normal and is non-tender to palpation, there is no CVA tenderness EXT: Normal ROM in all joints; non-tender to palpation; no edema; normal capillary refill; no cyanosis    SKIN: Normal color for age and race; warm NEURO: Moves all extremities equally PSYCH: The patient's mood and manner are appropriate. Grooming and personal hygiene are appropriate.  MEDICAL DECISION MAKING: Patient here with chest pain. He does have a history of tobacco abuse and family history of cardiac disease. He is not sure if he has any other medical problems as he's not primary care physician in many years. EKG shows Q waves in septal comparison. Patient x-ray shows possible pneumonia but given he has no cough, fever or leukocytosis, feel this is unlikely. Troponin pending.  ED PROGRESS: Patient's troponin is greater than 20. His chest pain-free after one nitroglycerin. He has received  aspirin. Will start nitroglycerin drip, heparin. Will discuss with Dr. Algie Coffer for admission. Patient updated on plan.    EKG Interpretation    Date/Time:  Friday December 15 2013 19:48:33 EST Ventricular Rate:  98 PR Interval:  154 QRS Duration: 78 QT Interval:  344 QTC Calculation: 439 R  Axis:   81 Text Interpretation:  Normal sinus rhythm Septal infarct , age undetermined Abnormal ECG Confirmed by Adedamola Seto  DO, Lafayette Dunlevy ([id number]) on 12/15/2013 10:32:18 PM              Layla Maw Alvar Malinoski, DO 12/16/13 2130

## 2013-12-16 NOTE — H&P (Signed)
William Baxter is an 53 y.o. male.   Chief Complaint: Chest pain HPI: 53 y.o. male with a history of tobacco abuse and a family history of premature coronary artery disease to father, presents the emergency department with intermittent episodes of substernal chest pain he describes as feeling "like heartburn" and like a pressure pain radiating to left arm. He reports the symptoms have been going on since the beginning of February. Currently he is pain free and EKG shows anteroseptal wall MI without significant acute ST elevations. He denies fever, cough or injury.   History reviewed. No pertinent past medical history.    History reviewed. No pertinent past surgical history.  History reviewed. No pertinent family history. Social History:  reports that he has been smoking.  He does not have any smokeless tobacco history on file. He reports that he drinks alcohol. He reports that he does not use illicit drugs.  Allergies:  Allergies  Allergen Reactions  . Penicillins Nausea And Vomiting     (Not in a hospital admission)  Results for orders placed during the hospital encounter of 12/15/13 (from the past 48 hour(s))  CBC     Status: None   Collection Time    12/15/13  8:03 PM      Result Value Ref Range   WBC 5.9  4.0 - 10.5 K/uL   RBC 4.95  4.22 - 5.81 MIL/uL   Hemoglobin 15.5  13.0 - 17.0 g/dL   HCT 45.4  39.0 - 52.0 %   MCV 91.7  78.0 - 100.0 fL   MCH 31.3  26.0 - 34.0 pg   MCHC 34.1  30.0 - 36.0 g/dL   RDW 14.2  11.5 - 15.5 %   Platelets 150  150 - 400 K/uL  BASIC METABOLIC PANEL     Status: Abnormal   Collection Time    12/15/13  8:03 PM      Result Value Ref Range   Sodium 136 (*) 137 - 147 mEq/L   Potassium 3.8  3.7 - 5.3 mEq/L   Chloride 97  96 - 112 mEq/L   CO2 25  19 - 32 mEq/L   Glucose, Bld 99  70 - 99 mg/dL   BUN 7  6 - 23 mg/dL   Creatinine, Ser 0.78  0.50 - 1.35 mg/dL   Calcium 10.1  8.4 - 10.5 mg/dL   GFR calc non Af Amer >90  >90 mL/min   GFR calc Af  Amer >90  >90 mL/min   Comment: (NOTE)     The eGFR has been calculated using the CKD EPI equation.     This calculation has not been validated in all clinical situations.     eGFR's persistently <90 mL/min signify possible Chronic Kidney     Disease.  TROPONIN I     Status: Abnormal   Collection Time    12/15/13 11:02 PM      Result Value Ref Range   Troponin I >20.00 (*) <0.30 ng/mL   Comment:            Due to the release kinetics of cTnI,     a negative result within the first hours     of the onset of symptoms does not rule out     myocardial infarction with certainty.     If myocardial infarction is still suspected,     repeat the test at appropriate intervals.     CRITICAL RESULT CALLED TO, READ BACK BY AND  VERIFIED WITH:     GOSS,C RN 12/16/2013 0003 JORDANS   Dg Chest 2 View  12/15/2013   CLINICAL DATA:  Heartburn like pain in center of chest  EXAM: CHEST  2 VIEW  COMPARISON:  DG CHEST 2 VIEW dated 05/27/2013; DG CHEST 1V PORT dated 08/28/2005; CT CHEST W/CM dated 08/28/2005; DG CHEST 2 VIEW dated 10/05/2005; DG CHEST 2 VIEW dated 08/29/2005  FINDINGS: Unchanged cardiac silhouette and mediastinal contours. The lungs are hyperexpanded with flattening of the bilateral hemidiaphragms and mild nodular thickening of the pulmonary interstitium. Interval development of ill-defined heterogeneous nodular airspace opacities within the suspected medial aspect of the left lower lung, best appreciated on the provided lateral radiograph. No pleural effusion or pneumothorax. No evidence of edema. Unchanged bones.  IMPRESSION: Findings worrisome for developing infection likely within the medial aspect of the left lower lung, best appreciated on the provided lateral radiograph. A follow-up chest radiograph in 4 to 6 weeks after treatment is recommended to ensure resolution.   Electronically Signed   By: Sandi Mariscal M.D.   On: 12/15/2013 21:15    ROS Constitutional: no fever  Eyes: no drainage  ENT:  no runny nose  Cardiovascular: chest pain  Resp: no SOB  GI: no vomiting  GU: no dysuria  Integumentary: no rash  Allergy: no hives  Musculoskeletal: no leg swelling  Neurological: no slurred speech  ROS otherwise negative  Blood pressure 109/68, pulse 73, temperature 98.4 F (36.9 C), temperature source Oral, resp. rate 18, SpO2 99.00%. Physical Exam: CONSTITUTIONAL: Alert and oriented and responds appropriately to questions. Well-appearing; well-nourished  HEAD: Normocephalic, atraumatic. Brown eyes, Conjunctivae-pink, Cornea- clear, PERRL, Sclera-white. ENT: normal nose; no rhinorrhea; moist mucous membranes; pharynx without lesions noted  NECK: Supple, no JVD  CARD: RRR; S1 and S2 normal. no murmurs, no clicks, no rubs, no gallops  RESP: Normal chest excursion without splinting or tachypnea; breath sounds clear and equal bilaterally. ABD/GI: Normal bowel sounds; non-distended; soft, non-tender, no rebound, no guarding.  BACK: The back appears normal and is non-tender to palpation, there is no CVA tenderness  EXT: Normal ROM in all joints; non-tender to palpation; no edema; normal capillary refill; no cyanosis  SKIN: Normal color for age and race; warm  NEURO: Moves all extremities equally  PSYCH: The patient's mood and manner are appropriate. Grooming and personal hygiene are appropriate.  Assessment/Plan Acute coronary syndrome Possible recent anteroseptal MI. Tobacco use disorder Family history of premature CAD  Admit IV NTG C. Cath emergently if chest pain recurs.   William Baxter S 12/16/2013, 12:45 AM

## 2013-12-16 NOTE — Progress Notes (Signed)
Echocardiogram 2D Echocardiogram has been performed.  Dorothey BasemanReel, Elisandra Deshmukh M 12/16/2013, 1:21 PM

## 2013-12-16 NOTE — Progress Notes (Signed)
Subjective:  Awake. No chest pain. Troponin-I trending downward. Probably had MI 48 to 72 hours ago.  Objective:  Vital Signs in the last 24 hours: Temp:  [98.2 F (36.8 C)-98.5 F (36.9 C)] 98.5 F (36.9 C) (02/14 0800) Pulse Rate:  [63-88] 65 (02/14 0700) Cardiac Rhythm:  [-] Normal sinus rhythm (02/14 0800) Resp:  [13-22] 16 (02/14 0700) BP: (96-132)/(51-87) 102/78 mmHg (02/14 0700) SpO2:  [95 %-100 %] 100 % (02/14 0700) Weight:  [73.2 kg (161 lb 6 oz)] 73.2 kg (161 lb 6 oz) (02/14 0200)  Physical Exam: BP Readings from Last 1 Encounters:  12/16/13 102/78     Wt Readings from Last 1 Encounters:  12/16/13 73.2 kg (161 lb 6 oz)    Weight change:   HEENT: [place]/AT, Eyes-Brown, PERL, EOMI, Conjunctiva-Pink, Sclera-Non-icteric Neck: No JVD, No bruit, Trachea midline. Lungs:  Clear, Bilateral. Cardiac:  Regular rhythm, normal S1 and S2, no S3.  Abdomen:  Soft, non-tender. Extremities:  No edema present. No cyanosis. No clubbing. CNS: AxOx3, Cranial nerves grossly intact, moves all 4 extremities. Right handed. Skin: Warm and dry.   Intake/Output from previous day: 02/13 0701 - 02/14 0700 In: 438.1 [P.O.:220; I.V.:218.1] Out: -     Lab Results: BMET    Component Value Date/Time   NA 138 12/16/2013 0725   K 4.4 12/16/2013 0725   CL 100 12/16/2013 0725   CO2 26 12/16/2013 0725   GLUCOSE 97 12/16/2013 0725   BUN 8 12/16/2013 0725   CREATININE 0.74 12/16/2013 0725   CALCIUM 9.3 12/16/2013 0725   GFRNONAA >90 12/16/2013 0725   GFRAA >90 12/16/2013 0725   CBC    Component Value Date/Time   WBC 6.0 12/16/2013 0725   RBC 4.48 12/16/2013 0725   HGB 14.3 12/16/2013 0725   HCT 40.9 12/16/2013 0725   PLT 138* 12/16/2013 0725   MCV 91.3 12/16/2013 0725   MCH 31.9 12/16/2013 0725   MCHC 35.0 12/16/2013 0725   RDW 14.3 12/16/2013 0725   LYMPHSABS 2.8 01/21/2009 2153   MONOABS 0.4 01/21/2009 2153   EOSABS 0.2 01/21/2009 2153   BASOSABS 0.0 01/21/2009 2153   CARDIAC ENZYMES Lab Results   Component Value Date   TROPONINI 10.66* 12/16/2013    Scheduled Meds: . aspirin  324 mg Oral NOW   Or  . aspirin  300 mg Rectal NOW  . [START ON 12/17/2013] aspirin EC  81 mg Oral Daily  . atorvastatin  80 mg Oral q1800  . metoprolol tartrate  12.5 mg Oral BID   Continuous Infusions: . sodium chloride 30 mL/hr at 12/16/13 0201  . heparin 1,100 Units/hr (12/16/13 0200)  . nitroGLYCERIN 10 mcg/min (12/16/13 0615)   PRN Meds:.acetaminophen, ALPRAZolam, nitroGLYCERIN, ondansetron (ZOFRAN) IV  Assessment/Plan: NSTEMI Tobacco use disorder  Family history of premature CAD   Cardiac cath on Monday. Patient understood procedure, risks and alternatives.   LOS: 1 day    Orpah CobbAjay Taquisha Phung  MD  12/16/2013, 10:39 AM

## 2013-12-16 NOTE — Progress Notes (Addendum)
ANTICOAGULATION CONSULT NOTE - Initial Consult  Pharmacy Consult for heparin Indication: chest pain/ACS  Allergies  Allergen Reactions  . Penicillins Nausea And Vomiting    Patient Measurements: Heparin Dosing Weight: ~80kg  Vital Signs: Temp: 98.3 F (36.8 C) (02/14 0400) Temp src: Oral (02/14 0400) BP: 102/78 mmHg (02/14 0700) Pulse Rate: 65 (02/14 0700)  Labs:  Recent Labs  12/15/13 2003 12/15/13 2302 12/16/13 0301 12/16/13 0725  HGB 15.5  --   --  14.3  HCT 45.4  --   --  40.9  PLT 150  --   --  138*  LABPROT  --   --   --  13.3  INR  --   --   --  1.03  HEPARINUNFRC  --   --   --  0.42  CREATININE 0.78  --   --  0.74  TROPONINI  --  >20.00* 9.51* 10.66*    Medical History: Past Medical History  Diagnosis Date  . Medical history non-contributory      Assessment: 53yo male w/ no known significant PMH except tobacco use c/o upper CP radiating to mid-sternum x8d, troponin >20, to begin heparin for NSTEMI. HL this am is therapeutic at 0.42, H/H wnl and stable, plt slightly low but stable, no noted s/s bleeding.  Confirmatory HL therapeutic (0.35) but decreased from this am. Will increase dose slightly to maintain therapeutic range.   Goal of Therapy:  Heparin level 0.3-0.7 units/ml Monitor platelets by anticoagulation protocol: Yes   Plan:  - Increase heparin to 1200 units/hr - Daily HL, CBC  - Continue to monitor for any s/s bleeding  Margie BilletErika K. von Vajna, PharmD Clinical Pharmacist - Resident Pager: [phone] Pharmacy: [phone] 12/16/2013 3:40 PM

## 2013-12-16 NOTE — ED Notes (Signed)
>  20 troponin critical lab value, Dr Elesa MassedWard notified

## 2013-12-16 NOTE — Progress Notes (Signed)
ANTICOAGULATION CONSULT NOTE - Initial Consult  Pharmacy Consult for heparin Indication: chest pain/ACS  Allergies  Allergen Reactions  . Penicillins Nausea And Vomiting    Patient Measurements: Heparin Dosing Weight: ~80kg  Vital Signs: Temp: 98.4 F (36.9 C) (02/13 1956) Temp src: Oral (02/13 1956) BP: 126/78 mmHg (02/13 2300) Pulse Rate: 73 (02/13 2300)  Labs:  Recent Labs  12/15/13 2003 12/15/13 2302  HGB 15.5  --   HCT 45.4  --   PLT 150  --   CREATININE 0.78  --   TROPONINI  --  >20.00*    Medical History: History reviewed. No pertinent past medical history.   Assessment: 53yo male w/ no known significant PMH except tobacco use c/o upper CP radiating to mid-sternum x8d, troponin >20, to begin heparin for ACS.  Goal of Therapy:  Heparin level 0.3-0.7 units/ml Monitor platelets by anticoagulation protocol: Yes   Plan:  Will give heparin 4000 units IV bolus x1 followed by gtt at 1100 units/hr and monitor heparin levels and CBC.  Vernard GamblesVeronda Ariq Khamis, PharmD, BCPS  12/16/2013,12:19 AM

## 2013-12-17 LAB — CBC
HCT: 45.5 % (ref 39.0–52.0)
HEMATOCRIT: 43 % (ref 39.0–52.0)
HEMOGLOBIN: 15.5 g/dL (ref 13.0–17.0)
Hemoglobin: 15.2 g/dL (ref 13.0–17.0)
MCH: 32.5 pg (ref 26.0–34.0)
MCH: 31.7 pg (ref 26.0–34.0)
MCHC: 35.3 g/dL (ref 30.0–36.0)
MCHC: 34.1 g/dL (ref 30.0–36.0)
MCV: 92.1 fL (ref 78.0–100.0)
MCV: 93 fL (ref 78.0–100.0)
PLATELETS: 148 10*3/uL — AB (ref 150–400)
Platelets: 160 10*3/uL (ref 150–400)
RBC: 4.67 MIL/uL (ref 4.22–5.81)
RBC: 4.89 MIL/uL (ref 4.22–5.81)
RDW: 14.4 % (ref 11.5–15.5)
RDW: 14.3 % (ref 11.5–15.5)
WBC: 6.6 10*3/uL (ref 4.0–10.5)
WBC: 6.6 10*3/uL (ref 4.0–10.5)

## 2013-12-17 LAB — BASIC METABOLIC PANEL
BUN: 12 mg/dL (ref 6–23)
CHLORIDE: 101 meq/L (ref 96–112)
CO2: 25 meq/L (ref 19–32)
Calcium: 9.6 mg/dL (ref 8.4–10.5)
Creatinine, Ser: 0.85 mg/dL (ref 0.50–1.35)
GFR calc non Af Amer: >90 mL/min (ref >90)
GLUCOSE: 99 mg/dL (ref 70–99)
POTASSIUM: 4.1 meq/L (ref 3.7–5.3)
Sodium: 138 mEq/L (ref 137–147)

## 2013-12-17 LAB — PROTIME-INR
INR: 0.95 (ref 0.00–1.49)
Prothrombin Time: 12.5 seconds (ref 11.6–15.2)

## 2013-12-17 LAB — HEPARIN LEVEL (UNFRACTIONATED): HEPARIN UNFRACTIONATED: 0.44 [IU]/mL (ref 0.30–0.70)

## 2013-12-17 MED ORDER — SODIUM CHLORIDE 0.9 % IJ SOLN: 3.0000 mL | INTRAMUSCULAR | Status: DC | PRN

## 2013-12-17 MED ORDER — SODIUM CHLORIDE 0.9 % IV SOLN: INTRAVENOUS | Status: DC

## 2013-12-17 MED ORDER — SODIUM CHLORIDE 0.9 % IJ SOLN: 3.0000 mL | Freq: Two times a day (BID) | INTRAMUSCULAR | Status: DC

## 2013-12-17 MED ORDER — DIAZEPAM 5 MG PO TABS
5.0000 mg | ORAL_TABLET | ORAL | Status: AC
  Administered 2013-12-18: 5 mg via ORAL
  Filled 2013-12-17: qty 1

## 2013-12-17 MED ORDER — CLOPIDOGREL BISULFATE 75 MG PO TABS
75.0000 mg | ORAL_TABLET | ORAL | Status: AC
  Administered 2013-12-18: 75 mg via ORAL
  Filled 2013-12-17: qty 1

## 2013-12-17 MED ORDER — SODIUM CHLORIDE 0.9 % IV SOLN: 250.0000 mL | INTRAVENOUS | Status: DC | PRN

## 2013-12-17 NOTE — Progress Notes (Signed)
ANTICOAGULATION CONSULT NOTE - Initial Consult  Pharmacy Consult for heparin Indication: chest pain/ACS  Allergies  Allergen Reactions  . Penicillins Nausea And Vomiting    Patient Measurements: Heparin Dosing Weight: ~80kg  Vital Signs: Temp: 98.3 F (36.8 C) (02/15 1200) Temp src: Oral (02/15 1200) BP: 126/79 mmHg (02/15 0900) Pulse Rate: 69 (02/15 0900)  Labs:  Recent Labs  12/15/13 2003  12/16/13 0301 12/16/13 0725 12/16/13 1425 12/17/13 0250  HGB 15.5  --   --  14.3  --  15.2  HCT 45.4  --   --  40.9  --  43.0  PLT 150  --   --  138*  --  160  LABPROT  --   --   --  13.3  --   --   INR  --   --   --  1.03  --   --   HEPARINUNFRC  --   --   --  0.42 0.35 0.44  CREATININE 0.78  --   --  0.74  --   --   TROPONINI  --   < > 9.51* 10.66* 7.00*  --   < > = values in this interval not displayed.  Medical History: Past Medical History  Diagnosis Date  . Medical history non-contributory      Assessment: 53yo male w/ no known significant PMH except tobacco use c/o upper CP radiating to mid-sternum x8d, troponin >20, to begin heparin for NSTEMI. HL this am remains therapeutic at 0.44 after slight rate increase yesterday. H/H wnl and stable, plt now wnl, no reported s/s bleeding.  Goal of Therapy:  Heparin level 0.3-0.7 units/ml Monitor platelets by anticoagulation protocol: Yes   Plan:  - Continue heparin at 1200 units/hr - Daily HL, CBC  - Continue to monitor for any s/s bleeding  Margie BilletErika K. von Vajna, PharmD Clinical Pharmacist - Resident Pager: [phone] Pharmacy: [phone] 12/17/2013 1:19 PM

## 2013-12-17 NOTE — Progress Notes (Signed)
Pt wanted to walk around the unit I informed him that because of his MI we did not want him walking that far right now but allowed him to get in the wheelchair and be pushed around the unit by his visitor. I told the pt multiple times to stay on the unit and not leave. He stated "I understand I just want a change in scenery." When I came back to help another nurse the secretary informed me he had left the unit. I went after him and he had gone to the [place] [facility] with his visitor to get his laptop. I explained to the pt that he was not to do that and how serious it was if something were to happen. I also informed him that he would not be able to go anywhere out of his room without a nurse or staff member present.

## 2013-12-17 NOTE — Progress Notes (Signed)
EKG CRITICAL VALUE     12 lead EKG performed.  Critical value noted. Mikeal HawthorneSylvia Piland, RN notified.   Debbora Ang, CCT 12/17/2013 8:44 AM

## 2013-12-17 NOTE — Progress Notes (Signed)
Dr. Algie CofferKadakia notified for EKG CRITICAL TEST RESULT--STEMI.  PT. DENIES CP or other s/s.  Will continue to monitor.

## 2013-12-17 NOTE — Progress Notes (Signed)
Subjective:  Occasional chest discomfort off NTG drip. Afebrile. No cough.  Objective:  Vital Signs in the last 24 hours: Temp:  [98 F (36.7 C)-98.7 F (37.1 C)] 98 F (36.7 C) (02/15 0800) Pulse Rate:  [63-83] 69 (02/15 0900) Cardiac Rhythm:  [-] Normal sinus rhythm (02/15 0731) Resp:  [13-19] 19 (02/14 1900) BP: (91-142)/(51-83) 126/79 mmHg (02/15 0900) SpO2:  [96 %-100 %] 100 % (02/15 0900)  Physical Exam: BP Readings from Last 1 Encounters:  12/17/13 126/79     Wt Readings from Last 1 Encounters:  12/16/13 73.2 kg (161 lb 6 oz)    Weight change:   HEENT: [place]/AT, Eyes-Brown, PERL, EOMI, Conjunctiva-Pink, Sclera-Non-icteric Neck: No JVD, No bruit, Trachea midline. Lungs:  Clear, Bilateral. Cardiac:  Regular rhythm, normal S1 and S2, no S3.  Abdomen:  Soft, non-tender. Extremities:  No edema present. No cyanosis. No clubbing. CNS: AxOx3, Cranial nerves grossly intact, moves all 4 extremities. Right handed. Skin: Warm and dry.   Intake/Output from previous day: 02/14 0701 - 02/15 0700 In: 1614 [P.O.:600; I.V.:1014] Out: -     Lab Results: BMET    Component Value Date/Time   NA 138 12/16/2013 0725   K 4.4 12/16/2013 0725   CL 100 12/16/2013 0725   CO2 26 12/16/2013 0725   GLUCOSE 97 12/16/2013 0725   BUN 8 12/16/2013 0725   CREATININE 0.74 12/16/2013 0725   CALCIUM 9.3 12/16/2013 0725   GFRNONAA >90 12/16/2013 0725   GFRAA >90 12/16/2013 0725   CBC    Component Value Date/Time   WBC 6.6 12/17/2013 0250   RBC 4.67 12/17/2013 0250   HGB 15.2 12/17/2013 0250   HCT 43.0 12/17/2013 0250   PLT 160 12/17/2013 0250   MCV 92.1 12/17/2013 0250   MCH 32.5 12/17/2013 0250   MCHC 35.3 12/17/2013 0250   RDW 14.4 12/17/2013 0250   LYMPHSABS 2.8 01/21/2009 2153   MONOABS 0.4 01/21/2009 2153   EOSABS 0.2 01/21/2009 2153   BASOSABS 0.0 01/21/2009 2153   CARDIAC ENZYMES Lab Results  Component Value Date   TROPONINI 7.00* 12/16/2013    Scheduled Meds: . aspirin EC  81 mg Oral Daily   . atorvastatin  80 mg Oral q1800  . metoprolol tartrate  12.5 mg Oral BID   Continuous Infusions: . sodium chloride 30 mL/hr at 12/16/13 0201  . heparin 1,200 Units/hr (12/17/13 1153)  . nitroGLYCERIN Stopped (12/16/13 1200)   PRN Meds:.acetaminophen, ALPRAZolam, nitroGLYCERIN, ondansetron (ZOFRAN) IV  Assessment/Plan:  NSTEMI Tobacco use disorder  Family history of premature CAD  Cardiac cath in AM. Resume NTG drip.   LOS: 2 days    Orpah CobbAjay Kaicen Desena  MD  12/17/2013, 11:57 AM

## 2013-12-18 ENCOUNTER — Encounter (HOSPITAL_COMMUNITY)
Admission: EM | Disposition: A | Discharge status: Home / Self Care | Payer: Self-pay | Source: Home / Self Care | Attending: Cardiovascular Disease

## 2013-12-18 LAB — HEPARIN LEVEL (UNFRACTIONATED): Heparin Unfractionated: 0.39 IU/mL (ref 0.30–0.70)

## 2013-12-18 LAB — POCT ACTIVATED CLOTTING TIME: Activated Clotting Time: 110 seconds

## 2013-12-18 LAB — CBC
HEMATOCRIT: 38.9 % — AB (ref 39.0–52.0)
HEMOGLOBIN: 13.4 g/dL (ref 13.0–17.0)
MCH: 31.8 pg (ref 26.0–34.0)
MCHC: 34.4 g/dL (ref 30.0–36.0)
MCV: 92.4 fL (ref 78.0–100.0)
Platelets: 140 10*3/uL — ABNORMAL LOW (ref 150–400)
RBC: 4.21 MIL/uL — AB (ref 4.22–5.81)
RDW: 14.5 % (ref 11.5–15.5)
WBC: 5.8 10*3/uL (ref 4.0–10.5)

## 2013-12-18 SURGERY — LEFT HEART CATHETERIZATION WITH CORONARY ANGIOGRAM
Anesthesia: LOCAL

## 2013-12-18 MED ORDER — CLOPIDOGREL BISULFATE 75 MG PO TABS
75.0000 mg | ORAL_TABLET | Freq: Every day | ORAL | Status: DC
  Administered 2013-12-19: 75 mg via ORAL
  Filled 2013-12-18: qty 1

## 2013-12-18 MED ORDER — NITROGLYCERIN 0.2 MG/ML ON CALL CATH LAB
INTRAVENOUS | Status: AC
  Filled 2013-12-18: qty 1

## 2013-12-18 MED ORDER — MIDAZOLAM HCL 2 MG/2ML IJ SOLN
INTRAMUSCULAR | Status: AC
  Filled 2013-12-18: qty 2

## 2013-12-18 MED ORDER — LIDOCAINE HCL (PF) 1 % IJ SOLN
INTRAMUSCULAR | Status: AC
  Filled 2013-12-18: qty 30

## 2013-12-18 MED ORDER — SODIUM CHLORIDE 0.9 % IV SOLN: INTRAVENOUS | Status: DC

## 2013-12-18 MED ORDER — HEPARIN (PORCINE) IN NACL 2-0.9 UNIT/ML-% IJ SOLN
INTRAMUSCULAR | Status: AC
  Filled 2013-12-18: qty 1500

## 2013-12-18 MED ORDER — FENTANYL CITRATE 0.05 MG/ML IJ SOLN
INTRAMUSCULAR | Status: AC
  Filled 2013-12-18: qty 2

## 2013-12-18 NOTE — Progress Notes (Signed)
ANTICOAGULATION CONSULT NOTE - Initial Consult  Pharmacy Consult for heparin Indication: chest pain/ACS  Allergies  Allergen Reactions  . Penicillins Nausea And Vomiting    Patient Measurements: Heparin Dosing Weight: ~80kg  Vital Signs: Temp: 98.5 F (36.9 C) (02/16 0400) Temp src: Oral (02/16 0400) BP: 112/85 mmHg (02/16 0700) Pulse Rate: 71 (02/15 2115)  Labs:  Recent Labs  12/15/13 2003  12/16/13 0301  12/16/13 0725 12/16/13 1425 12/17/13 0250 12/17/13 1427 12/18/13 0436  HGB 15.5  --   --   --  14.3  --  15.2 15.5 13.4  HCT 45.4  --   --   --  40.9  --  43.0 45.5 38.9*  PLT 150  --   --   --  138*  --  160 148* 140*  LABPROT  --   --   --   --  13.3  --   --  12.5  --   INR  --   --   --   --  1.03  --   --  0.95  --   HEPARINUNFRC  --   --   --   < > 0.42 0.35 0.44  --  0.39  CREATININE 0.78  --   --   --  0.74  --   --  0.85  --   TROPONINI  --   < > 9.51*  --  10.66* 7.00*  --   --   --   < > = values in this interval not displayed.  Medical History: Past Medical History  Diagnosis Date  . Medical history non-contributory    Assessment: 53yo male w/ no known significant PMH except tobacco use c/o upper CP radiating to mid-sternum x8d, troponin >20, on heparin for NSTEMI.   HL this am remains therapeutic at 0.39. H/H wnl and stable, plt now wnl, no reported s/s bleeding.  Troponin trending down (20 > 9.51 > 7). LVEF 50-55%.   Cath planned for today.   Goal of Therapy:  Heparin level 0.3-0.7 units/ml Monitor platelets by anticoagulation protocol: Yes   Plan:  - Continue heparin at 1200 units/hr - Daily HL, CBC  - Continue to monitor for any s/s bleeding - F/u anticoagulation plans after cath this morning  Arvle Grabe C. Jocelynn Gioffre, PharmD Clinical Pharmacist-Resident Pager: [phone] Pharmacy: [phone] 12/18/2013 7:08 AM

## 2013-12-18 NOTE — Interval H&P Note (Signed)
History and Physical Interval Note:  12/18/2013 12:36 PM  William Baxter  has presented today for surgery, with the diagnosis of Recent MI  The various methods of treatment have been discussed with the patient and family. After consideration of risks, benefits and other options for treatment, the patient has consented to  Procedure(s): LEFT HEART CATHETERIZATION WITH CORONARY ANGIOGRAM (N/A) as a surgical intervention .  The patient's history has been reviewed, patient examined, no change in status, stable for surgery.  I have reviewed the patient's chart and labs.  Questions were answered to the patient's satisfaction.     [name] S

## 2013-12-18 NOTE — CV Procedure (Signed)
PROCEDURE:  Left heart catheterization with selective coronary angiography, left ventriculogram.  CLINICAL HISTORY:  This is a 53 years old male with NSTEMI and resolution of chest pain on arrival to the hospital. Occasional chest pain while waiting here.  The risks, benefits, and details of the procedure were explained to the patient.  The patient verbalized understanding and wanted to proceed.  Informed written consent was obtained.  PROCEDURE TECHNIQUE:  The patient was approached from the right femoral artery using a 5 French short sheath.  Left coronary angiography was done using a Judkins L4 guide catheter.  Right coronary angiography was done using a Judkins R4 guide catheter.  Left ventriculography was done using a pigtail catheter.    CONTRAST:  Total of 55 cc.  COMPLICATIONS:  None.  At the end of the procedure a manual pressure was used for hemostasis.    HEMODYNAMICS:  Aortic pressure was 126/60; LV pressure was 124/4; LVEDP 36.  There was no gradient between the left ventricle and aorta.    ANGIOGRAM/CORONARY ARTERIOGRAM:   The left main coronary artery is short and unremarkable.  The left anterior descending artery is unremarkable but diagonal one is large and has osteal concentric 90 % stenosis.  The left circumflex artery is dominant and unremarkable, but high OM has proximal 95 % stenosis and is small caliber vessel. OM2 and 3 are narrow vessel and posterior descending artery is unremarkable.  The right coronary artery is small and unremarkable.  LEFT VENTRICULOGRAM:  Left ventricular angiogram was done in the 30 RAO projection and revealed normal left ventricular wall motion and systolic function with an estimated ejection fraction of 55%.  LVEDP was 36 mmHg.  IMPRESSION OF HEART CATHETERIZATION:   1. Normal left main coronary artery. 2. Normal left anterior descending artery and severe disease of diagonal branch. 3. Normal and dominant left circumflex artery and severe  OM 1 disease. 4. Normal right coronary artery. 5. Normal left ventricular systolic function.  LVEDP 36 mmHg.  Ejection fraction 55 %.  RECOMMENDATION:   Maximal medical treatment. If symptomatic then diagonal angioplasty post review of cine with Dr. Sharyn LullHarwani. Patient and wife are in agreement.

## 2013-12-18 NOTE — H&P (View-Only) (Signed)
Subjective:  Awake. No chest pain. Troponin-I trending downward. Probably had MI 48 to 72 hours ago.  Objective:  Vital Signs in the last 24 hours: Temp:  [98.2 F (36.8 C)-98.5 F (36.9 C)] 98.5 F (36.9 C) (02/14 0800) Pulse Rate:  [63-88] 65 (02/14 0700) Cardiac Rhythm:  [-] Normal sinus rhythm (02/14 0800) Resp:  [13-22] 16 (02/14 0700) BP: (96-132)/(51-87) 102/78 mmHg (02/14 0700) SpO2:  [95 %-100 %] 100 % (02/14 0700) Weight:  [73.2 kg (161 lb 6 oz)] 73.2 kg (161 lb 6 oz) (02/14 0200)  Physical Exam: BP Readings from Last 1 Encounters:  12/16/13 102/78     Wt Readings from Last 1 Encounters:  12/16/13 73.2 kg (161 lb 6 oz)    Weight change:   HEENT: [place]/AT, Eyes-Brown, PERL, EOMI, Conjunctiva-Pink, Sclera-Non-icteric Neck: No JVD, No bruit, Trachea midline. Lungs:  Clear, Bilateral. Cardiac:  Regular rhythm, normal S1 and S2, no S3.  Abdomen:  Soft, non-tender. Extremities:  No edema present. No cyanosis. No clubbing. CNS: AxOx3, Cranial nerves grossly intact, moves all 4 extremities. Right handed. Skin: Warm and dry.   Intake/Output from previous day: 02/13 0701 - 02/14 0700 In: 438.1 [P.O.:220; I.V.:218.1] Out: -     Lab Results: BMET    Component Value Date/Time   NA 138 12/16/2013 0725   K 4.4 12/16/2013 0725   CL 100 12/16/2013 0725   CO2 26 12/16/2013 0725   GLUCOSE 97 12/16/2013 0725   BUN 8 12/16/2013 0725   CREATININE 0.74 12/16/2013 0725   CALCIUM 9.3 12/16/2013 0725   GFRNONAA >90 12/16/2013 0725   GFRAA >90 12/16/2013 0725   CBC    Component Value Date/Time   WBC 6.0 12/16/2013 0725   RBC 4.48 12/16/2013 0725   HGB 14.3 12/16/2013 0725   HCT 40.9 12/16/2013 0725   PLT 138* 12/16/2013 0725   MCV 91.3 12/16/2013 0725   MCH 31.9 12/16/2013 0725   MCHC 35.0 12/16/2013 0725   RDW 14.3 12/16/2013 0725   LYMPHSABS 2.8 01/21/2009 2153   MONOABS 0.4 01/21/2009 2153   EOSABS 0.2 01/21/2009 2153   BASOSABS 0.0 01/21/2009 2153   CARDIAC ENZYMES Lab Results   Component Value Date   TROPONINI 10.66* 12/16/2013    Scheduled Meds: . aspirin  324 mg Oral NOW   Or  . aspirin  300 mg Rectal NOW  . [START ON 12/17/2013] aspirin EC  81 mg Oral Daily  . atorvastatin  80 mg Oral q1800  . metoprolol tartrate  12.5 mg Oral BID   Continuous Infusions: . sodium chloride 30 mL/hr at 12/16/13 0201  . heparin 1,100 Units/hr (12/16/13 0200)  . nitroGLYCERIN 10 mcg/min (12/16/13 0615)   PRN Meds:.acetaminophen, ALPRAZolam, nitroGLYCERIN, ondansetron (ZOFRAN) IV  Assessment/Plan: NSTEMI Tobacco use disorder  Family history of premature CAD   Cardiac cath on Monday. Patient understood procedure, risks and alternatives.   LOS: 1 day    Kortney Schoenfelder  MD  12/16/2013, 10:39 AM      

## 2013-12-19 LAB — CBC
HEMATOCRIT: 40 % (ref 39.0–52.0)
HEMOGLOBIN: 13.8 g/dL (ref 13.0–17.0)
MCH: 31.8 pg (ref 26.0–34.0)
MCHC: 34.5 g/dL (ref 30.0–36.0)
MCV: 92.2 fL (ref 78.0–100.0)
Platelets: 141 10*3/uL — ABNORMAL LOW (ref 150–400)
RBC: 4.34 MIL/uL (ref 4.22–5.81)
RDW: 14.3 % (ref 11.5–15.5)
WBC: 6.7 10*3/uL (ref 4.0–10.5)

## 2013-12-19 MED ORDER — LISINOPRIL 2.5 MG PO TABS: 2.5000 mg | ORAL_TABLET | Freq: Every day | ORAL | Status: DC

## 2013-12-19 MED ORDER — CLOPIDOGREL BISULFATE 75 MG PO TABS: 75.0000 mg | ORAL_TABLET | Freq: Every day | ORAL | Status: DC

## 2013-12-19 MED ORDER — ATORVASTATIN CALCIUM 80 MG PO TABS
80.0000 mg | ORAL_TABLET | Freq: Every day | ORAL | Status: AC
Start: 2013-12-19 — End: ?

## 2013-12-19 MED ORDER — METOPROLOL TARTRATE 12.5 MG HALF TABLET: 12.5000 mg | ORAL_TABLET | Freq: Two times a day (BID) | ORAL | Status: DC

## 2013-12-19 MED ORDER — NITROGLYCERIN 0.4 MG SL SUBL: 0.4000 mg | SUBLINGUAL_TABLET | SUBLINGUAL | Status: AC | PRN

## 2013-12-19 MED ORDER — ASPIRIN 81 MG PO TBEC: 81.0000 mg | DELAYED_RELEASE_TABLET | Freq: Every day | ORAL | Status: DC

## 2013-12-19 NOTE — Discharge Summary (Signed)
Physician Discharge Summary  Patient ID: William Baxter MRN: [id number] DOB/AGE: 06/15/1961 53 y.o.  Admit date: 12/15/2013 Discharge date: 12/19/2013  Admission Diagnoses: NSTEMI  Tobacco use disorder  Family history of premature CAD  Discharge Diagnoses:  Principle Problem: * NSTEMI * Multivessel, native vessel, Coronary artery disease Tobacco use disorder  Family history of premature CAD  Discharged Condition: good  Hospital Course: 53 y.o. male with a history of tobacco abuse and a family history of premature coronary artery disease to father, presented the emergency department with intermittent episodes of substernal chest pain he describes as feeling "like heartburn" and like a pressure pain radiating to left arm. He reported the symptoms have been going on since the beginning of February. He had intermittent chest pain and his EKG showed anteroseptal wall MI without significant acute ST elevations. His cardiac enzyme Troponin I was trending downward suggesting MI over 48-72 hour duration. 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 With lack of symptoms patient agreed to medical therapy before attempting technically difficult diagonal artery angioplasty. Patient was advised to refrain from smoking.  Consults: cardiology  Significant Diagnostic Studies: labs: Near normal CBC and BMET. 1st Troponin I over 20 ng/mL and 2nd troponin I was 9.51. Lipid profile was normal with LDL cholesterol of 90 and HDL cholesterol of 77.  Treatments: cardiac meds: lisinopril (Zestril), metoprolol, Lipitor, aspirin and Plavix post IV heparin and NTG.  EKG-NSR, Possible antero-septal wall MI.   Echocardiogram showed: Left ventricle: The cavity size was normal. Systolic function was normal. The estimated ejection fraction was in the range of 50% to 55%. Possible mild hypokinesis of the basal-midanteroseptal myocardium.  Doppler parameters are consistent with abnormal left ventricular relaxation (grade 1 diastolic dysfunction).  Cardiac cath showed severe disease of small caliber OM 1 and severe disease of Diagonal one.  Discharge Exam: Blood pressure 105/54, pulse 71, temperature 98 F (36.7 C), temperature source Oral, resp. rate 18, height 5\' 11"  (1.803 m), weight 74.3 kg (163 lb 12.8 oz), SpO2 98.00%. HEENT: [place]/AT, Eyes-Brown, PERL, EOMI, Conjunctiva-Pink, Sclera-Non-icteric  Neck: No JVD, No bruit, Trachea midline.  Lungs: Clear, Bilateral.  Cardiac: Regular rhythm, normal S1 and S2, no S3.  Abdomen: Soft, non-tender.  Extremities: No edema present. No cyanosis. No clubbing.  CNS: AxOx3, Cranial nerves grossly intact, moves all 4 extremities. Right handed.  Skin: Warm and dry.   Disposition: 01-Home or Self Care     Medication List         acetaminophen 500 MG tablet  Commonly known as:  TYLENOL  Take 1,000 mg by mouth every 6 (six) hours as needed for mild pain.     aspirin 81 MG EC tablet  Take 1 tablet (81 mg total) by mouth daily.     atorvastatin 80 MG tablet  Commonly known as:  LIPITOR  Take 1 tablet (80 mg total) by mouth daily at 6 PM.     clopidogrel 75 MG tablet  Commonly known as:  PLAVIX  Take 1 tablet (75 mg total) by mouth daily with breakfast.     lisinopril 2.5 MG tablet  Commonly known as:  ZESTRIL  Take 1 tablet (2.5 mg total) by mouth daily.     metoprolol tartrate 12.5 mg Tabs tablet  Commonly known as:  LOPRESSOR  Take 0.5 tablets (12.5 mg total) by mouth 2 (two) times daily.     nitroGLYCERIN 0.4 MG SL tablet  Commonly known  as:  NITROSTAT  Place 1 tablet (0.4 mg total) under the tongue every 5 (five) minutes as needed for chest pain.           Follow-up Information   [facility]. Schedule an appointment as soon as possible for a visit in 1 week.   Specialty:  Cardiology   Contact information:   [address] [place] [address] [phone]       Signed: Ricki RodriguezKADAKIA,Alveta Quintela S 12/19/2013, 10:52 AM

## 2013-12-19 NOTE — Progress Notes (Signed)
Patient was discharged to home with family. He was escorted to his car following completion of discharge teaching with teach back

## 2014-01-01 ENCOUNTER — Encounter (HOSPITAL_COMMUNITY): Payer: Self-pay | Admitting: Emergency Medicine

## 2014-01-01 ENCOUNTER — Emergency Department (HOSPITAL_COMMUNITY)
Admission: EM | Admit: 2014-01-01 | Discharge: 2014-01-01 | Disposition: A | Discharge status: Home / Self Care | Payer: Non-veteran care | Attending: Emergency Medicine | Admitting: Emergency Medicine

## 2014-01-01 ENCOUNTER — Emergency Department (HOSPITAL_COMMUNITY): Payer: Non-veteran care

## 2014-01-01 DIAGNOSIS — Z79899 Other long term (current) drug therapy: Secondary | ICD-10-CM | POA: Insufficient documentation

## 2014-01-01 DIAGNOSIS — Z7902 Long term (current) use of antithrombotics/antiplatelets: Secondary | ICD-10-CM | POA: Insufficient documentation

## 2014-01-01 DIAGNOSIS — Z880 Allergy status to penicillin: Secondary | ICD-10-CM | POA: Insufficient documentation

## 2014-01-01 DIAGNOSIS — R079 Chest pain, unspecified: Secondary | ICD-10-CM

## 2014-01-01 DIAGNOSIS — I252 Old myocardial infarction: Secondary | ICD-10-CM | POA: Insufficient documentation

## 2014-01-01 DIAGNOSIS — R072 Precordial pain: Secondary | ICD-10-CM | POA: Insufficient documentation

## 2014-01-01 DIAGNOSIS — Z7982 Long term (current) use of aspirin: Secondary | ICD-10-CM | POA: Insufficient documentation

## 2014-01-01 DIAGNOSIS — F17200 Nicotine dependence, unspecified, uncomplicated: Secondary | ICD-10-CM | POA: Insufficient documentation

## 2014-01-01 LAB — BASIC METABOLIC PANEL
BUN: 13 mg/dL (ref 6–23)
CO2: 25 meq/L (ref 19–32)
CREATININE: 0.87 mg/dL (ref 0.50–1.35)
Calcium: 9.7 mg/dL (ref 8.4–10.5)
Chloride: 95 mEq/L — ABNORMAL LOW (ref 96–112)
GFR calc Af Amer: >90 mL/min (ref >90)
GFR calc non Af Amer: >90 mL/min (ref >90)
GLUCOSE: 80 mg/dL (ref 70–99)
Potassium: 4.4 mEq/L (ref 3.7–5.3)
Sodium: 133 mEq/L — ABNORMAL LOW (ref 137–147)

## 2014-01-01 LAB — I-STAT TROPONIN, ED
TROPONIN I, POC: 0 ng/mL (ref 0.00–0.08)
TROPONIN I, POC: 0 ng/mL (ref 0.00–0.08)

## 2014-01-01 LAB — CBC
HCT: 42.9 % (ref 39.0–52.0)
HEMOGLOBIN: 14.9 g/dL (ref 13.0–17.0)
MCH: 32 pg (ref 26.0–34.0)
MCHC: 34.7 g/dL (ref 30.0–36.0)
MCV: 92.3 fL (ref 78.0–100.0)
PLATELETS: 189 10*3/uL (ref 150–400)
RBC: 4.65 MIL/uL (ref 4.22–5.81)
RDW: 14.1 % (ref 11.5–15.5)
WBC: 5.9 10*3/uL (ref 4.0–10.5)

## 2014-01-01 MED ORDER — ISOSORBIDE MONONITRATE ER 30 MG PO TB24
30.0000 mg | ORAL_TABLET | Freq: Every day | ORAL | Status: DC
  Administered 2014-01-01: 30 mg via ORAL
  Filled 2014-01-01: qty 1

## 2014-01-01 NOTE — ED Provider Notes (Signed)
CSN: [id number]     Arrival date & time 01/01/14  1503 History   None    Chief Complaint  Patient presents with  . Chest Pain   HPI Comments: 53 yo M hx of 30 pack/year smoking hx, FHx of CAD, recent NSTEMI presents with CC of CP.  Pt states symptoms began at rest at 2 PM.  Pt c/o central burning CP, nonradiating.  Denies associated diaphoresis, SOB, nausea, fever, chills, cough, or any other symptoms.  Pt took one SL nitro without relief of pain.  EMS called.  Pt was given another SL nitro and ASA 324 mg, with resolution of pain.  He states he felt slightly nauseated after these medications, but currently is asymptomatic.  Pt with recent NSTEMI last month, severe diagonal, OM disease.  Pt was treated medically and d/c home in good condition.  He has been taking medications since then as prescribed including ASA, Lipitor, Plavix, Lisinopril, Metoprolol.    Patient is a 53 y.o. male presenting with chest pain. The history is provided by the patient. No language interpreter was used.  Chest Pain Pain location:  Substernal area Pain quality: burning   Pain radiates to:  Does not radiate Pain radiates to the back: no   Pain severity:  Moderate Onset quality:  Sudden Duration:  30 minutes Timing:  Constant Progression:  Resolved Chronicity:  Recurrent Context: at rest   Context: not breathing, no drug use, not eating, no intercourse, not lifting, no movement, not raising an arm, no stress and no trauma   Relieved by:  Nitroglycerin Worsened by:  Nothing tried Ineffective treatments:  Rest Associated symptoms: no abdominal pain, no AICD problem, no altered mental status, no anorexia, no anxiety, no back pain, no claudication, no cough, no diaphoresis, no dizziness, no dysphagia, no fatigue, no fever, no headache, no heartburn, no lower extremity edema, no nausea, no near-syncope, no numbness, no orthopnea, no palpitations, no PND, no shortness of breath, no syncope, not vomiting and no weakness    Risk factors: coronary artery disease and male sex   Risk factors: no aortic disease, no birth control, no diabetes mellitus, no Ehlers-Danlos syndrome, no high cholesterol, no hypertension, no immobilization, no Marfan's syndrome, not obese, not pregnant, no prior DVT/PE, no smoking and no surgery   Risk factors comment:  Father MI in 4240's   Past Medical History  Diagnosis Date  . Medical history non-contributory    History reviewed. No pertinent past surgical history. History reviewed. No pertinent family history. History  Substance Use Topics  . Smoking status: Current Every Day Smoker -- 1.00 packs/day    Types: Cigarettes  . Smokeless tobacco: Not on file  . Alcohol Use: 1.2 oz/week    2 Cans of beer per week    Review of Systems  Constitutional: Negative for fever, diaphoresis and fatigue.  HENT: Negative for trouble swallowing.   Respiratory: Negative for cough and shortness of breath.   Cardiovascular: Positive for chest pain. Negative for palpitations, orthopnea, claudication, syncope, PND and near-syncope.  Gastrointestinal: Negative for heartburn, nausea, vomiting, abdominal pain and anorexia.  Musculoskeletal: Negative for back pain.  Skin: Negative for rash.  Neurological: Negative for dizziness, weakness, light-headedness, numbness and headaches.  All other systems reviewed and are negative.   Allergies  Penicillins  Home Medications   Current Outpatient Rx  Name  Route  Sig  Dispense  Refill  . acetaminophen (TYLENOL) 325 MG tablet   Oral   Take 325 mg by  mouth every morning.         Marland Kitchen aspirin EC 81 MG EC tablet   Oral   Take 1 tablet (81 mg total) by mouth daily.         Marland Kitchen atorvastatin (LIPITOR) 80 MG tablet   Oral   Take 1 tablet (80 mg total) by mouth daily at 6 PM.   30 tablet   3   . clopidogrel (PLAVIX) 75 MG tablet   Oral   Take 1 tablet (75 mg total) by mouth daily with breakfast.   30 tablet   6   . lisinopril (ZESTRIL) 2.5 MG  tablet   Oral   Take 1 tablet (2.5 mg total) by mouth daily.   30 tablet   3   . metoprolol tartrate (LOPRESSOR) 12.5 mg TABS tablet   Oral   Take 0.5 tablets (12.5 mg total) by mouth 2 (two) times daily.   30 tablet   3   . nitroGLYCERIN (NITROSTAT) 0.4 MG SL tablet   Sublingual   Place 1 tablet (0.4 mg total) under the tongue every 5 (five) minutes as needed for chest pain.   25 tablet   1    BP 102/62  Pulse 70  Temp(Src) 98.2 F (36.8 C) (Oral)  Resp 20  Ht 5' 10.5" (1.791 m)  Wt 175 lb (79.379 kg)  BMI 24.75 kg/m2  SpO2 95% Physical Exam  Nursing note and vitals reviewed. Constitutional: He is oriented to person, place, and time. He appears well-developed and well-nourished.  HENT:  Head: Normocephalic and atraumatic.  Right Ear: External ear normal.  Left Ear: External ear normal.  Nose: Nose normal.  Mouth/Throat: Oropharynx is clear and moist.  Eyes: Conjunctivae and EOM are normal. Pupils are equal, round, and reactive to light.  Neck: Normal range of motion.  Cardiovascular: Normal rate, regular rhythm, normal heart sounds and intact distal pulses.   Pulmonary/Chest: Effort normal and breath sounds normal. No respiratory distress. He has no wheezes. He has no rales. He exhibits no tenderness.  Abdominal: Soft. Bowel sounds are normal. He exhibits no distension and no mass. There is no tenderness. There is no rebound and no guarding.  Musculoskeletal: Normal range of motion.  Neurological: He is alert and oriented to person, place, and time.  Skin: Skin is warm and dry.    ED Course  Procedures (including critical care time) Labs Review Labs Reviewed  BASIC METABOLIC PANEL - Abnormal; Notable for the following:    Sodium 133 (*)    Chloride 95 (*)    All other components within normal limits  CBC  I-STAT TROPOININ, ED   Imaging Review Dg Chest 2 View  01/01/2014   CLINICAL DATA:  Chest pain.  EXAM: CHEST  2 VIEW  COMPARISON:  12/15/2013  FINDINGS:  The heart size and mediastinal contours are within normal limits. Both lungs are clear. The visualized skeletal structures are unremarkable.  IMPRESSION: No active cardiopulmonary disease.   Electronically Signed   By: Signa Kell M.D.   On: 01/01/2014 16:59     EKG Interpretation   Date/Time:  Monday January 01 2014 15:10:14 EST Ventricular Rate:  74 PR Interval:  168 QRS Duration: 75 QT Interval:  379 QTC Calculation: 420 R Axis:   73 Text Interpretation:  Sinus rhythm ST elev, probable normal early repol  pattern No significant change was found Confirmed by [facility]  MD, TREY  ([id number]) on 01/01/2014 4:33:37 PM      MDM  Final diagnoses:  None   53 yo M hx of 30 pack/year smoking hx, FHx of CAD, recent NSTEMI presents with CC of CP.   Filed Vitals:   01/01/14 1549  BP: 102/62  Pulse: 70  Temp:   Resp: 20   Physical exam as above. VS WNL.  Pt currently asymptomatic, s/p ASA 324 mg, SL NG X 2.  CXR WNL.  EKG shows NSR, concave up ST changes in anterior leads, with J point elevation, similar to previous EKG.  Troponin 0.00.  CBC WNL.  BMP mild hyponatremia 133, hypochloremia 95.    Pt with recent NSTEMI, on medical therapy, with c/o CP now resolved.  Pt high risk for ACS.  Cardiology consulted for admission.    Pt's care discussed with Dr. Loretha Stapler.  Jon Gills, MD       Jon Gills, MD 01/02/14 [phone]

## 2014-01-01 NOTE — ED Notes (Signed)
Pt advised that the MD is in a procedure with another patient, and that this RN would get discharge papers from the MD as soon as possible.

## 2014-01-01 NOTE — ED Notes (Signed)
Pt states he is chest pain free at this time. He is warm and dry. Sinus rhythm on monitor.

## 2014-01-01 NOTE — ED Notes (Signed)
Pharmacy notified of the pt's Imdur order.

## 2014-01-01 NOTE — ED Notes (Signed)
Per EMS: pt started having CP an hour and a half prior to arrival. Pt took 1 SL nitro with no relief pt given 1 more by EMS. Pt given 324mg  of aspirin. Center mid chest epigastric. Pt had similar pain a month ago and was cathed with no blockage found and sent home with nitro.

## 2014-01-01 NOTE — ED Notes (Signed)
Patient transported to X-ray 

## 2014-01-01 NOTE — Discharge Instructions (Signed)
Chest Pain (Nonspecific) °It is often hard to give a specific diagnosis for the cause of chest pain. There is always a chance that your pain could be related to something serious, such as a heart attack or a blood clot in the lungs. You need to follow up with your caregiver for further evaluation. °CAUSES  °· Heartburn. °· Pneumonia or bronchitis. °· Anxiety or stress. °· Inflammation around your heart (pericarditis) or lung (pleuritis or pleurisy). °· A blood clot in the lung. °· A collapsed lung (pneumothorax). It can develop suddenly on its own (spontaneous pneumothorax) or from injury (trauma) to the chest. °· Shingles infection (herpes zoster virus). °The chest wall is composed of bones, muscles, and cartilage. Any of these can be the source of the pain. °· The bones can be bruised by injury. °· The muscles or cartilage can be strained by coughing or overwork. °· The cartilage can be affected by inflammation and become sore (costochondritis). °DIAGNOSIS  °Lab tests or other studies, such as X-rays, electrocardiography, stress testing, or cardiac imaging, may be needed to find the cause of your pain.  °TREATMENT  °· Treatment depends on what may be causing your chest pain. Treatment may include: °· Acid blockers for heartburn. °· Anti-inflammatory medicine. °· Pain medicine for inflammatory conditions. °· Antibiotics if an infection is present. °· You may be advised to change lifestyle habits. This includes stopping smoking and avoiding alcohol, caffeine, and chocolate. °· You may be advised to keep your head raised (elevated) when sleeping. This reduces the chance of acid going backward from your stomach into your esophagus. °· Most of the time, nonspecific chest pain will improve within 2 to 3 days with rest and mild pain medicine. °HOME CARE INSTRUCTIONS  °· If antibiotics were prescribed, take your antibiotics as directed. Finish them even if you start to feel better. °· For the next few days, avoid physical  activities that bring on chest pain. Continue physical activities as directed. °· Do not smoke. °· Avoid drinking alcohol. °· Only take over-the-counter or prescription medicine for pain, discomfort, or fever as directed by your caregiver. °· Follow your caregiver's suggestions for further testing if your chest pain does not go away. °· Keep any follow-up appointments you made. If you do not go to an appointment, you could develop lasting (chronic) problems with pain. If there is any problem keeping an appointment, you must call to reschedule. °SEEK MEDICAL CARE IF:  °· You think you are having problems from the medicine you are taking. Read your medicine instructions carefully. °· Your chest pain does not go away, even after treatment. °· You develop a rash with blisters on your chest. °SEEK IMMEDIATE MEDICAL CARE IF:  °· You have increased chest pain or pain that spreads to your arm, neck, jaw, back, or abdomen. °· You develop shortness of breath, an increasing cough, or you are coughing up blood. °· You have severe back or abdominal pain, feel nauseous, or vomit. °· You develop severe weakness, fainting, or chills. °· You have a fever. °THIS IS AN EMERGENCY. Do not wait to see if the pain will go away. Get medical help at once. Call your local emergency services (911 in [place] Do not drive yourself to the hospital. °MAKE SURE YOU:  °· Understand these instructions. °· Will watch your condition. °· Will get help right away if you are not doing well or get worse. °Document Released: 07/29/2005 Document Revised: 01/11/2012 Document Reviewed: 05/24/2008 °Exit[employer]® Patient Information ©2014 [employer]   [employer]. ° °

## 2014-01-01 NOTE — ED Notes (Signed)
William Baxter is an 53 y.o. male.   Chief Complaint: Chest pain HPI: 53 year old male with known CAD and NSTEMI 2-3 weeks ago has nonexertional chest pain improved with one SL NTG given by EMS. No new EKG changes and Troponin-I is normal.  Past Medical History  Diagnosis Date  . Medical history non-contributory       History reviewed. No pertinent past surgical history.  History reviewed. No pertinent family history. Social History:  reports that he has been smoking Cigarettes.  He has been smoking about 1.00 pack per day. He does not have any smokeless tobacco history on file. He reports that he drinks about 1.2 ounces of alcohol per week. He reports that he does not use illicit drugs.  Allergies:  Allergies  Allergen Reactions  . Penicillins Nausea And Vomiting     (Not in a hospital admission)  Results for orders placed during the hospital encounter of 01/01/14 (from the past 48 hour(s))  CBC     Status: None   Collection Time    01/01/14  3:43 PM      Result Value Ref Range   WBC 5.9  4.0 - 10.5 K/uL   RBC 4.65  4.22 - 5.81 MIL/uL   Hemoglobin 14.9  13.0 - 17.0 g/dL   HCT 42.9  39.0 - 52.0 %   MCV 92.3  78.0 - 100.0 fL   MCH 32.0  26.0 - 34.0 pg   MCHC 34.7  30.0 - 36.0 g/dL   RDW 14.1  11.5 - 15.5 %   Platelets 189  150 - 400 K/uL  BASIC METABOLIC PANEL     Status: Abnormal   Collection Time    01/01/14  3:43 PM      Result Value Ref Range   Sodium 133 (*) 137 - 147 mEq/L   Potassium 4.4  3.7 - 5.3 mEq/L   Chloride 95 (*) 96 - 112 mEq/L   CO2 25  19 - 32 mEq/L   Glucose, Bld 80  70 - 99 mg/dL   BUN 13  6 - 23 mg/dL   Creatinine, Ser 0.87  0.50 - 1.35 mg/dL   Calcium 9.7  8.4 - 10.5 mg/dL   GFR calc non Af Amer >90  >90 mL/min   GFR calc Af Amer >90  >90 mL/min   Comment: (NOTE)     The eGFR has been calculated using the CKD EPI equation.     This calculation has not been validated in all clinical situations.     eGFR's persistently <90 mL/min signify  possible Chronic Kidney     Disease.  William Baxter, ED     Status: None   Collection Time    01/01/14  3:51 PM      Result Value Ref Range   Troponin i, poc 0.00  0.00 - 0.08 ng/mL   Comment 3            Comment: Due to the release kinetics of cTnI,     a negative result within the first hours     of the onset of symptoms does not rule out     myocardial infarction with certainty.     If myocardial infarction is still suspected,     repeat the test at appropriate intervals.   Dg Chest 2 View  01/01/2014   CLINICAL DATA:  Chest pain.  EXAM: CHEST  2 VIEW  COMPARISON:  12/15/2013  FINDINGS: The heart size and mediastinal  contours are within normal limits. Both lungs are clear. The visualized skeletal structures are unremarkable.  IMPRESSION: No active cardiopulmonary disease.   Electronically Signed   By: Kerby Moors M.D.   On: 01/01/2014 16:59    ROS Constitutional: no fever  Eyes: no drainage  ENT: no runny nose  Cardiovascular: chest pain  Resp: no SOB  GI: no vomiting  GU: no dysuria  Integumentary: no rash  Allergy: no hives  Musculoskeletal: no leg swelling  Neurological: no slurred speech  ROS otherwise negative  Blood pressure 113/82, pulse 70, temperature 98.2 F (36.8 C), temperature source Oral, resp. rate 17, height 5' 10.5" (1.791 m), weight 79.379 kg (175 lb), SpO2 98.00%. Physical Exam:  CONSTITUTIONAL: Alert and oriented and responds appropriately to questions. Well-appearing; well-nourished  HEAD: Normocephalic, atraumatic. Brown eyes, Conjunctivae-pink, Cornea- clear, PERRL, Sclera-white.  ENT: normal nose; no rhinorrhea; moist mucous membranes; pharynx without lesions noted  NECK: Supple, no JVD  CARD: RRR; S1 and S2 normal. no murmurs, no clicks, no rubs, no gallops. II/VI systolic murmur. RESP: Normal chest excursion without splinting or tachypnea; breath sounds clear and equal bilaterally.  ABD/GI: Normal bowel sounds; non-distended; soft,  non-tender, no rebound, no guarding.  EXT: Normal ROM in all joints; non-tender to palpation; no edema; normal capillary refill; no cyanosis. No groin hematoma.  SKIN: Normal color for age and race; warm  NEURO: Moves all extremities equally. Cranial nerves grossly intact.  PSYCH: The patient's mood and manner are appropriate. Grooming and personal hygiene are appropriate.   Assessment/Plan Chest pain S/P recent NSTEMI Multivessel, native vessel, Coronary artery disease  Tobacco use disorder  Family history of premature CAD  Refuses inpatient observation. Add Imdur. F/U in 1 week.  William Baxter S 01/01/2014, 7:03 PM

## 2014-01-01 NOTE — ED Provider Notes (Signed)
I saw and evaluated the patient, reviewed the resident's note and I agree with the findings and plan.   EKG Interpretation   Date/Time:  Monday January 01 2014 15:10:14 EST Ventricular Rate:  74 PR Interval:  168 QRS Duration: 75 QT Interval:  379 QTC Calculation: 420 R Axis:   73 Text Interpretation:  Sinus rhythm ST elev, probable normal early repol  pattern No significant change was found Confirmed by [facility]  MD, TREY  ([id number]) on 01/01/2014 4:33:37 PM      53 yo male with recent NSTEMI last month presenting with similar type chest pain.  States it feels the same except not as bad, not associated with diaphoresis or dizziness this time.  Resolved after EMS administered nitro.  On exam, well-appearing, not distressed, normal perfusion, heart sounds normal with regular rate and rhythm, lungs clear to auscultation bilaterally, abdomen soft and nontender. Initial workup reassuring.  Plan cardiology consult.  Cardiology consulted (Dr. Algie CofferKadakia) and pt reportedly declined admission.  Dr. Algie CofferKadakia started pt on Imdur and will follow up with him closely. Please see Dr. Roseanne KaufmanKadakia's note.  Clinical Impression: 1. Chest pain       William ChurnJohn David Finnean Cerami III, MD 01/02/14 [phone]

## 2014-01-01 NOTE — ED Notes (Signed)
Pt states that ever since the Super Bowl he has been having pains in her chest. Pt states that 2 weeks ago he came and was told that he was having a Heart Attack and cathed. Pt states that today he was having intermittant CP that was center chest/epigastric. Pt states that he would try and drink water and the pain would go away. Pt also states that he took his Nitro did not help the pain. Pt denies burping, or changes to diet or increase of spicy foods.

## 2014-01-02 NOTE — ED Provider Notes (Signed)
I saw and evaluated the patient, reviewed the resident's note and I agree with the findings and plan.   EKG Interpretation   Date/Time:  Monday January 01 2014 15:10:14 EST Ventricular Rate:  74 PR Interval:  168 QRS Duration: 75 QT Interval:  379 QTC Calculation: 420 R Axis:   73 Text Interpretation:  Sinus rhythm ST elev, probable normal early repol  pattern No significant change was found Confirmed by [facility]  MD, TREY  ([id number]) on 01/01/2014 4:33:37 PM        Jonny RuizJohn Young Berryavid Delonte Musich III, MD 01/02/14 [phone]

## 2014-08-15 ENCOUNTER — Emergency Department (HOSPITAL_COMMUNITY)
Admission: EM | Admit: 2014-08-15 | Discharge: 2014-08-15 | Disposition: A | Discharge status: Home / Self Care | Payer: Non-veteran care | Attending: Emergency Medicine | Admitting: Emergency Medicine

## 2014-08-15 ENCOUNTER — Encounter (HOSPITAL_COMMUNITY): Payer: Self-pay | Admitting: Emergency Medicine

## 2014-08-15 DIAGNOSIS — Z79899 Other long term (current) drug therapy: Secondary | ICD-10-CM | POA: Insufficient documentation

## 2014-08-15 DIAGNOSIS — Z720 Tobacco use: Secondary | ICD-10-CM | POA: Insufficient documentation

## 2014-08-15 DIAGNOSIS — Z880 Allergy status to penicillin: Secondary | ICD-10-CM | POA: Insufficient documentation

## 2014-08-15 DIAGNOSIS — Z7982 Long term (current) use of aspirin: Secondary | ICD-10-CM | POA: Insufficient documentation

## 2014-08-15 DIAGNOSIS — Z7902 Long term (current) use of antithrombotics/antiplatelets: Secondary | ICD-10-CM | POA: Insufficient documentation

## 2014-08-15 DIAGNOSIS — Z202 Contact with and (suspected) exposure to infections with a predominantly sexual mode of transmission: Secondary | ICD-10-CM

## 2014-08-15 MED ORDER — ONDANSETRON 4 MG PO TBDP
4.0000 mg | ORAL_TABLET | Freq: Once | ORAL | Status: AC
  Administered 2014-08-15: 4 mg via ORAL
  Filled 2014-08-15: qty 1

## 2014-08-15 MED ORDER — METRONIDAZOLE 500 MG PO TABS
2000.0000 mg | ORAL_TABLET | Freq: Once | ORAL | Status: AC
  Administered 2014-08-15: 2000 mg via ORAL
  Filled 2014-08-15: qty 4

## 2014-08-15 NOTE — ED Provider Notes (Signed)
CSN: [id number]     Arrival date & time 08/15/14  1932 History  This chart was scribed for a non-physician practitioner, [name], PA-C working with Linwood DibblesJon Knapp, MD by [name], ED Scribe. The patient was seen in [facility]. The patient's care was started at 8:44 PM.    Chief Complaint  Patient presents with  . Exposure to STD      The history is provided by the patient. No language interpreter was used.   HPI Comments: William Baxter is a 53 y.o. male who presents to the Emergency Department complaining of STD exposure. Pt states his girlfriend told him that she is positive for trichomonas and that he needs to be treated. He is currently symptom free.    Past Medical History  Diagnosis Date  . Medical history non-contributory    History reviewed. No pertinent past surgical history. No family history on file. History  Substance Use Topics  . Smoking status: Current Every Day Smoker -- 1.00 packs/day    Types: Cigarettes  . Smokeless tobacco: Not on file  . Alcohol Use: 1.2 oz/week    2 Cans of beer per week    Review of Systems  Genitourinary:       STD exposure  All other systems reviewed and are negative.     Allergies  Penicillins  Home Medications   Prior to Admission medications   Medication Sig Start Date End Date Taking? Authorizing Provider  acetaminophen (TYLENOL) 325 MG tablet Take 325 mg by mouth every morning.    Historical Provider, MD  aspirin EC 81 MG EC tablet Take 1 tablet (81 mg total) by mouth daily. 12/19/13   Ricki RodriguezAjay S Kadakia, MD  atorvastatin (LIPITOR) 80 MG tablet Take 1 tablet (80 mg total) by mouth daily at 6 PM. 12/19/13   Ricki RodriguezAjay S Kadakia, MD  clopidogrel (PLAVIX) 75 MG tablet Take 1 tablet (75 mg total) by mouth daily with breakfast. 12/19/13   Ricki RodriguezAjay S Kadakia, MD  lisinopril (ZESTRIL) 2.5 MG tablet Take 1 tablet (2.5 mg total) by mouth daily. 12/19/13   Ricki RodriguezAjay S Kadakia, MD  metoprolol tartrate (LOPRESSOR) 12.5 mg TABS tablet Take 0.5  tablets (12.5 mg total) by mouth 2 (two) times daily. 12/19/13   Ricki RodriguezAjay S Kadakia, MD  nitroGLYCERIN (NITROSTAT) 0.4 MG SL tablet Place 1 tablet (0.4 mg total) under the tongue every 5 (five) minutes as needed for chest pain. 12/19/13   Ricki RodriguezAjay S Kadakia, MD   BP 114/72  Pulse 77  Temp(Src) 98.2 F (36.8 C) (Oral)  Resp 16  SpO2 99% Physical Exam  Nursing note and vitals reviewed. Constitutional: He appears well-developed and well-nourished. No distress.  HENT:  Head: Normocephalic and atraumatic.  Eyes: Conjunctivae are normal.  Neck: Normal range of motion.  Cardiovascular: Normal rate and regular rhythm.  Exam reveals no gallop and no friction rub.   No murmur heard. Pulmonary/Chest: Effort normal and breath sounds normal. He has no wheezes. He has no rales. He exhibits no tenderness.  Abdominal: Soft. There is no tenderness.  Musculoskeletal: Normal range of motion.  Neurological: He is alert.  Speech is goal-oriented. Moves limbs without ataxia.   Skin: Skin is warm and dry.  Psychiatric: He has a normal mood and affect. His behavior is normal.    ED Course  Procedures (including critical care time) Labs Review Labs Reviewed - No data to display  Results for orders placed during the hospital encounter of 01/01/14  CBC  Result Value Ref Range   WBC 5.9  4.0 - 10.5 K/uL   RBC 4.65  4.22 - 5.81 MIL/uL   Hemoglobin 14.9  13.0 - 17.0 g/dL   HCT 16.142.9  09.639.0 - 04.552.0 %   MCV 92.3  78.0 - 100.0 fL   MCH 32.0  26.0 - 34.0 pg   MCHC 34.7  30.0 - 36.0 g/dL   RDW 40.914.1  81.111.5 - 91.415.5 %   Platelets 189  150 - 400 K/uL  BASIC METABOLIC PANEL      Result Value Ref Range   Sodium 133 (*) 137 - 147 mEq/L   Potassium 4.4  3.7 - 5.3 mEq/L   Chloride 95 (*) 96 - 112 mEq/L   CO2 25  19 - 32 mEq/L   Glucose, Bld 80  70 - 99 mg/dL   BUN 13  6 - 23 mg/dL   Creatinine, Ser 7.820.87  0.50 - 1.35 mg/dL   Calcium 9.7  8.4 - 95.610.5 mg/dL   GFR calc non Af Amer >90  >90 mL/min   GFR calc Af Amer >90   >90 mL/min  I-STAT TROPOININ, ED      Result Value Ref Range   Troponin i, poc 0.00  0.00 - 0.08 ng/mL   Comment 3           I-STAT TROPOININ, ED      Result Value Ref Range   Troponin i, poc 0.00  0.00 - 0.08 ng/mL   Comment 3            No results found.    Imaging Review No results found.   EKG Interpretation None     Medications  metroNIDAZOLE (FLAGYL) tablet 2,000 mg (2,000 mg Oral Given 08/15/14 2042)  ondansetron (ZOFRAN-ODT) disintegrating tablet 4 mg (4 mg Oral Given 08/15/14 2042)    8:46 PM- Treatment plan was discussed with patient who verbalizes understanding and agrees.   MDM   Final diagnoses:  STD exposure    9:45 PM Patient reports trichomonas exposure and will be treated with 2g Flagyl. Vitals stable and patient afebrile. Patient has no symptoms at this time.   I personally performed the services described in this documentation, which was scribed in my presence. The recorded information has been reviewed and is accurate.   Emilia BeckKaitlyn Kaysa Roulhac, [place] JerseyPA-C 08/15/14 2146

## 2014-08-15 NOTE — ED Notes (Signed)
Pt reports his girlfriend told him that she is + for trichomonas and that he needs to be treated.  Denies any sxs at this time.

## 2014-08-15 NOTE — Discharge Instructions (Signed)
You have been treated for trichomonas here. Do not have sex for 7 days. Refer to attached documents for more information.

## 2014-08-16 NOTE — ED Provider Notes (Signed)
Medical screening examination/treatment/procedure(s) were performed by non-physician practitioner and as supervising physician I was immediately available for consultation/collaboration.    Jayshawn Colston, MD 08/16/14 0005 

## 2015-05-08 ENCOUNTER — Emergency Department (HOSPITAL_COMMUNITY)
Admission: EM | Admit: 2015-05-08 | Discharge: 2015-05-09 | Disposition: A | Discharge status: Home / Self Care | Payer: Non-veteran care | Attending: Emergency Medicine | Admitting: Emergency Medicine

## 2015-05-08 ENCOUNTER — Encounter (HOSPITAL_COMMUNITY): Payer: Self-pay | Admitting: Emergency Medicine

## 2015-05-08 ENCOUNTER — Emergency Department (HOSPITAL_COMMUNITY): Payer: Non-veteran care

## 2015-05-08 DIAGNOSIS — Z7982 Long term (current) use of aspirin: Secondary | ICD-10-CM | POA: Insufficient documentation

## 2015-05-08 DIAGNOSIS — H53149 Visual discomfort, unspecified: Secondary | ICD-10-CM | POA: Insufficient documentation

## 2015-05-08 DIAGNOSIS — Z880 Allergy status to penicillin: Secondary | ICD-10-CM | POA: Insufficient documentation

## 2015-05-08 DIAGNOSIS — Z79899 Other long term (current) drug therapy: Secondary | ICD-10-CM | POA: Insufficient documentation

## 2015-05-08 DIAGNOSIS — Z720 Tobacco use: Secondary | ICD-10-CM | POA: Insufficient documentation

## 2015-05-08 DIAGNOSIS — Z7902 Long term (current) use of antithrombotics/antiplatelets: Secondary | ICD-10-CM | POA: Insufficient documentation

## 2015-05-08 DIAGNOSIS — R519 Headache, unspecified: Secondary | ICD-10-CM

## 2015-05-08 DIAGNOSIS — Z8679 Personal history of other diseases of the circulatory system: Secondary | ICD-10-CM | POA: Insufficient documentation

## 2015-05-08 DIAGNOSIS — R51 Headache: Secondary | ICD-10-CM | POA: Insufficient documentation

## 2015-05-08 DIAGNOSIS — R110 Nausea: Secondary | ICD-10-CM | POA: Insufficient documentation

## 2015-05-08 HISTORY — DX: Heart disease, unspecified: I51.9

## 2015-05-08 MED ORDER — SODIUM CHLORIDE 0.9 % IV BOLUS (SEPSIS)
500.0000 mL | Freq: Once | INTRAVENOUS | Status: AC
  Administered 2015-05-08: 500 mL via INTRAVENOUS

## 2015-05-08 MED ORDER — PROMETHAZINE HCL 25 MG/ML IJ SOLN
12.5000 mg | Freq: Once | INTRAMUSCULAR | Status: AC
  Administered 2015-05-08: 12.5 mg via INTRAVENOUS
  Filled 2015-05-08: qty 1

## 2015-05-08 NOTE — ED Notes (Signed)
Pt. reports headache onset last Thursday , denies head injury , no emesis or photophobia . No fever or chills.

## 2015-05-08 NOTE — ED Provider Notes (Signed)
CSN: [id number]     Arrival date & time 05/08/15  2011 History   First MD Initiated Contact with Patient 05/08/15 2309     Chief Complaint  Patient presents with  . Headache     (Consider location/radiation/quality/duration/timing/severity/associated sxs/prior Treatment) Patient is a 54 y.o. male presenting with headaches. The history is provided by the patient.  Headache Associated symptoms: nausea and photophobia   Associated symptoms: no abdominal pain, no back pain, no diarrhea, no eye pain, no neck stiffness, no numbness, no vomiting and no weakness    patient has had a headache for the last week. It is on the right side of his head and dull. He now has some nausea and slight photophobia. States he's not had that the last day or 2. No fevers. Began on the top of his head and has worked its way to the right side. No confusion. No numbness weakness. He's not had pains like this before. No fevers or chills. No trauma. No vision changes.   Past Medical History  Diagnosis Date  . Medical history non-contributory   . Heart disease    History reviewed. No pertinent past surgical history. No family history on file. History  Substance Use Topics  . Smoking status: Current Every Day Smoker -- 0.00 packs/day    Types: Cigarettes  . Smokeless tobacco: Not on file  . Alcohol Use: Yes    Review of Systems  Constitutional: Negative for activity change and appetite change.  Eyes: Positive for photophobia. Negative for pain.  Respiratory: Negative for chest tightness and shortness of breath.   Cardiovascular: Negative for chest pain and leg swelling.  Gastrointestinal: Positive for nausea. Negative for vomiting, abdominal pain and diarrhea.  Genitourinary: Negative for flank pain.  Musculoskeletal: Negative for back pain and neck stiffness.  Skin: Negative for rash.  Neurological: Positive for headaches. Negative for weakness and numbness.  Psychiatric/Behavioral: Negative for behavioral  problems.      Allergies  Penicillins  Home Medications   Prior to Admission medications   Medication Sig Start Date End Date Taking? Authorizing Provider  acetaminophen (TYLENOL) 325 MG tablet Take 325 mg by mouth every morning.   Yes Historical Provider, MD  aspirin EC 81 MG EC tablet Take 1 tablet (81 mg total) by mouth daily. 12/19/13  Yes Orpah CobbAjay Kadakia, MD  atorvastatin (LIPITOR) 80 MG tablet Take 1 tablet (80 mg total) by mouth daily at 6 PM. 12/19/13  Yes Orpah CobbAjay Kadakia, MD  clopidogrel (PLAVIX) 75 MG tablet Take 1 tablet (75 mg total) by mouth daily with breakfast. 12/19/13  Yes Orpah CobbAjay Kadakia, MD  lisinopril (ZESTRIL) 2.5 MG tablet Take 1 tablet (2.5 mg total) by mouth daily. 12/19/13  Yes Orpah CobbAjay Kadakia, MD  metoprolol tartrate (LOPRESSOR) 12.5 mg TABS tablet Take 0.5 tablets (12.5 mg total) by mouth 2 (two) times daily. 12/19/13  Yes Orpah CobbAjay Kadakia, MD  nitroGLYCERIN (NITROSTAT) 0.4 MG SL tablet Place 1 tablet (0.4 mg total) under the tongue every 5 (five) minutes as needed for chest pain. 12/19/13  Yes Orpah CobbAjay Kadakia, MD   BP 138/95 mmHg  Pulse 67  Temp(Src) 98.6 F (37 C) (Oral)  Resp 24  Ht 5\' 11"  (1.803 m)  Wt 169 lb (76.658 kg)  BMI 23.58 kg/m2  SpO2 97% Physical Exam  Constitutional: He appears well-developed.  HENT:  Head: Atraumatic.  Eyes: EOM are normal. Pupils are equal, round, and reactive to light.  Neck: Neck supple.  Cardiovascular: Normal rate.   Pulmonary/Chest: Effort normal.  Abdominal: Soft.  Musculoskeletal: Normal range of motion.  Neurological: He is alert.  Skin: Skin is warm.    ED Course  Procedures (including critical care time) Labs Review Labs Reviewed - No data to display  Imaging Review Ct Head Wo Contrast  05/09/2015   CLINICAL DATA:  54 year old male with headache and vision disturbance  EXAM: CT HEAD WITHOUT CONTRAST  TECHNIQUE: Contiguous axial images were obtained from the base of the skull through the vertex without intravenous contrast.   COMPARISON:  Head CT dated 08/28/2005  FINDINGS: The ventricles and sulci are appropriate in size for the patient's age. Mild periventricular and deep white matter hypodensities represent chronic microvascular ischemic changes. There is no intracranial hemorrhage. No mass effect or midline shift identified.  There is mild mucoperiosteal thickening of paranasal sinuses. There is partial opacification with chronic appearing erosive changes of the right mastoids bone and air cells. Dedicated CT of the temporal bone may provide better evaluation. Clinical correlation is recommended. There is irregularity of the nasal bone compatible with age indeterminate fracture. The calvarium is intact.  IMPRESSION: No acute intracranial pathology.  Mild age-related atrophy and chronic microvascular ischemic disease. If symptoms persist and there are no contraindications, MRI may provide better evaluation if clinically indicated  Partial opacification erosive appearance of the right mastoid air cells. Clinical correlation is recommended. Nonemergent dedicated CT of the temporal bone is recommended for better evaluation.   Electronically Signed   By: Elgie Collard M.D.   On: 05/09/2015 00:03     EKG Interpretation None      MDM   Final diagnoses:  Nonintractable headache, unspecified chronicity pattern, unspecified headache type    Patient with headache. Has been bad for the last week. Feels better after treatment. CT scan showed erosive appearance of mastoid air cells. Patient states he has had allergies in the past but no other clear infection area states he'll follow-up. Will discharge home.    Benjiman Core, MD 05/09/15 [phone]

## 2015-05-09 ENCOUNTER — Encounter (HOSPITAL_COMMUNITY): Payer: Self-pay | Admitting: *Deleted

## 2015-05-09 ENCOUNTER — Emergency Department (HOSPITAL_COMMUNITY)
Admission: EM | Admit: 2015-05-09 | Discharge: 2015-05-09 | Disposition: A | Discharge status: Home / Self Care | Payer: Non-veteran care | Attending: Emergency Medicine | Admitting: Emergency Medicine

## 2015-05-09 DIAGNOSIS — Z880 Allergy status to penicillin: Secondary | ICD-10-CM | POA: Diagnosis not present

## 2015-05-09 DIAGNOSIS — Z79899 Other long term (current) drug therapy: Secondary | ICD-10-CM | POA: Insufficient documentation

## 2015-05-09 DIAGNOSIS — Z720 Tobacco use: Secondary | ICD-10-CM | POA: Diagnosis not present

## 2015-05-09 DIAGNOSIS — R51 Headache: Secondary | ICD-10-CM | POA: Insufficient documentation

## 2015-05-09 DIAGNOSIS — Z7982 Long term (current) use of aspirin: Secondary | ICD-10-CM | POA: Insufficient documentation

## 2015-05-09 DIAGNOSIS — Z7902 Long term (current) use of antithrombotics/antiplatelets: Secondary | ICD-10-CM | POA: Insufficient documentation

## 2015-05-09 DIAGNOSIS — Z8679 Personal history of other diseases of the circulatory system: Secondary | ICD-10-CM | POA: Insufficient documentation

## 2015-05-09 DIAGNOSIS — R519 Headache, unspecified: Secondary | ICD-10-CM

## 2015-05-09 MED ORDER — PROCHLORPERAZINE EDISYLATE 5 MG/ML IJ SOLN
10.0000 mg | Freq: Once | INTRAMUSCULAR | Status: AC
  Administered 2015-05-09: 10 mg via INTRAVENOUS
  Filled 2015-05-09: qty 2

## 2015-05-09 MED ORDER — KETOROLAC TROMETHAMINE 30 MG/ML IJ SOLN
30.0000 mg | Freq: Once | INTRAMUSCULAR | Status: AC
  Administered 2015-05-09: 30 mg via INTRAVENOUS
  Filled 2015-05-09: qty 1

## 2015-05-09 MED ORDER — BUTALBITAL-APAP-CAFFEINE 50-325-40 MG PO TABS
1.0000 | ORAL_TABLET | Freq: Four times a day (QID) | ORAL | Status: AC | PRN
Start: 2015-05-09 — End: 2016-05-08

## 2015-05-09 MED ORDER — SODIUM CHLORIDE 0.9 % IV BOLUS (SEPSIS)
1000.0000 mL | Freq: Once | INTRAVENOUS | Status: AC
  Administered 2015-05-09: 1000 mL via INTRAVENOUS

## 2015-05-09 NOTE — Discharge Instructions (Signed)
Take the prescribed medication as directed for rebound headache. °Follow-up with your primary care physician. °Return to the ED for new or worsening symptoms. ° °

## 2015-05-09 NOTE — ED Provider Notes (Signed)
CSN: [id number]     Arrival date & time 05/09/15  0941 History   First MD Initiated Contact with Patient 05/09/15 [phone]     Chief Complaint  Patient presents with  . Headache     (Consider location/radiation/quality/duration/timing/severity/associated sxs/prior Treatment) Patient is a 54 y.o. male presenting with headaches. The history is provided by the patient and medical records.  Headache   This is a 54 y.o. M with hx of heart disease, presenting to the ED for headache.  Patient was seen in ER last night, had a CT scan and was treated with pain meds with improvement.  States he went home and went to bed but awoke around 0500 and headache had returned.  He states pain is throbbing in nature, spanning across his entire forehead.  He denies photophobia, phonophobia, dizziness, confusion, visual disturbance, changes in speech, numbness or weakness of extremities, or gait disturbance. Patient has no history of migraines, states he infrequently gets headaches which are usually easily treated with Tylenol. No noted fever, chills, neck pain or stiffness.  VSS.  Past Medical History  Diagnosis Date  . Medical history non-contributory   . Heart disease    History reviewed. No pertinent past surgical history. No family history on file. History  Substance Use Topics  . Smoking status: Current Every Day Smoker -- 0.00 packs/day    Types: Cigarettes  . Smokeless tobacco: Not on file  . Alcohol Use: Yes     Comment: occ    Review of Systems  Neurological: Positive for headaches.  All other systems reviewed and are negative.     Allergies  Penicillins  Home Medications   Prior to Admission medications   Medication Sig Start Date End Date Taking? Authorizing Provider  acetaminophen (TYLENOL) 325 MG tablet Take 325 mg by mouth every morning.    Historical Provider, MD  aspirin EC 81 MG EC tablet Take 1 tablet (81 mg total) by mouth daily. 12/19/13   Orpah CobbAjay Kadakia, MD  atorvastatin  (LIPITOR) 80 MG tablet Take 1 tablet (80 mg total) by mouth daily at 6 PM. 12/19/13   Orpah CobbAjay Kadakia, MD  clopidogrel (PLAVIX) 75 MG tablet Take 1 tablet (75 mg total) by mouth daily with breakfast. 12/19/13   Orpah CobbAjay Kadakia, MD  lisinopril (ZESTRIL) 2.5 MG tablet Take 1 tablet (2.5 mg total) by mouth daily. 12/19/13   Orpah CobbAjay Kadakia, MD  metoprolol tartrate (LOPRESSOR) 12.5 mg TABS tablet Take 0.5 tablets (12.5 mg total) by mouth 2 (two) times daily. 12/19/13   Orpah CobbAjay Kadakia, MD  nitroGLYCERIN (NITROSTAT) 0.4 MG SL tablet Place 1 tablet (0.4 mg total) under the tongue every 5 (five) minutes as needed for chest pain. 12/19/13   Orpah CobbAjay Kadakia, MD   BP 95/68 mmHg  Pulse 73  Temp(Src) 98.7 F (37.1 C) (Oral)  Resp 16  Ht 5\' 11"  (1.803 m)  Wt 169 lb (76.658 kg)  BMI 23.58 kg/m2  SpO2 95%   Physical Exam  Constitutional: He is oriented to person, place, and time. He appears well-developed and well-nourished. No distress.  NAD, watching television and texting on cell phone  HENT:  Head: Normocephalic and atraumatic.  Right Ear: Tympanic membrane and ear canal normal.  Left Ear: Tympanic membrane and ear canal normal.  Nose: Right sinus exhibits no maxillary sinus tenderness and no frontal sinus tenderness. Left sinus exhibits no maxillary sinus tenderness and no frontal sinus tenderness.  Mouth/Throat: Uvula is midline, oropharynx is clear and moist and mucous membranes are normal.  No oropharyngeal exudate, posterior oropharyngeal edema, posterior oropharyngeal erythema or tonsillar abscesses.  Eyes: Conjunctivae and EOM are normal. Pupils are equal, round, and reactive to light.  Neck: Normal range of motion and full passive range of motion without pain. Neck supple. No rigidity.  No rigidity, full range of motion  Cardiovascular: Normal rate, regular rhythm and normal heart sounds.   Pulmonary/Chest: Effort normal and breath sounds normal. No respiratory distress. He has no wheezes.  Abdominal: Soft.  Bowel sounds are normal.  Musculoskeletal: Normal range of motion.  Neurological: He is alert and oriented to person, place, and time.  AAOx3, answering questions appropriately; equal strength UE and LE bilaterally; CN grossly intact; moves all extremities appropriately without ataxia; no focal neuro deficits or facial asymmetry appreciated  Skin: Skin is warm and dry. He is not diaphoretic.  Psychiatric: He has a normal mood and affect.  Nursing note and vitals reviewed.   ED Course  Procedures (including critical care time) Labs Review Labs Reviewed - No data to display  Imaging Review Ct Head Wo Contrast  05/09/2015   CLINICAL DATA:  54 year old male with headache and vision disturbance  EXAM: CT HEAD WITHOUT CONTRAST  TECHNIQUE: Contiguous axial images were obtained from the base of the skull through the vertex without intravenous contrast.  COMPARISON:  Head CT dated 08/28/2005  FINDINGS: The ventricles and sulci are appropriate in size for the patient's age. Mild periventricular and deep white matter hypodensities represent chronic microvascular ischemic changes. There is no intracranial hemorrhage. No mass effect or midline shift identified.  There is mild mucoperiosteal thickening of paranasal sinuses. There is partial opacification with chronic appearing erosive changes of the right mastoids bone and air cells. Dedicated CT of the temporal bone may provide better evaluation. Clinical correlation is recommended. There is irregularity of the nasal bone compatible with age indeterminate fracture. The calvarium is intact.  IMPRESSION: No acute intracranial pathology.  Mild age-related atrophy and chronic microvascular ischemic disease. If symptoms persist and there are no contraindications, MRI may provide better evaluation if clinically indicated  Partial opacification erosive appearance of the right mastoid air cells. Clinical correlation is recommended. Nonemergent dedicated CT of the temporal  bone is recommended for better evaluation.   Electronically Signed   By: Elgie Collard M.D.   On: 05/09/2015 00:03     EKG Interpretation None      MDM   Final diagnoses:  Headache, unspecified headache type   54 year old male here with headache. Seen for the same yesterday, given meds in the ED with improvement but headache returned earlier this morning. He had CT scan performed yesterday which showed chronic changes of microvascular ischemic disease and erosive appearance of right mastoid air cells which he was advised of yesterday. Patient is afebrile, nontoxic. He is in no acute distress, sitting upright in bed watching television and taxing on cell phone.  He has no infectious type symptoms-- no nasal congestion, cough, sore throat, ear pain, or sinus tenderness. He has no focal neurologic deficits on exam and I have low suspicion for acute intracranial pathology including TIA, stroke, ICH, SAH, or meningitis. Patient was treated with Toradol and Compazine with improvement of headache. He states he feels better and would like to go home which I feel is appropriate. Will provide with Fioricet for rebound headache if needed. [facility]. Discussed plan with patient, he/she acknowledged understanding and agreed with plan  of care.  Return precautions given for new or worsening symptoms.  Case discussed with attending physician, Dr. Jeraldine Loots, who agrees with assessment and plan of care.  Garlon Hatchet, PA-C 05/09/15 1309  Gerhard Munch, MD 05/09/15 [phone]

## 2015-05-09 NOTE — ED Notes (Signed)
MD at bedside. 

## 2015-05-09 NOTE — ED Notes (Signed)
Pt states he was tx here for  Headache last night.  States as soon as he got home headache became worse, so he's back.

## 2015-05-09 NOTE — Discharge Instructions (Signed)

## 2016-01-07 ENCOUNTER — Emergency Department (HOSPITAL_COMMUNITY)
Admission: EM | Admit: 2016-01-07 | Discharge: 2016-01-07 | Disposition: A | Discharge status: Home / Self Care | Payer: Non-veteran care | Attending: Emergency Medicine | Admitting: Emergency Medicine

## 2016-01-07 ENCOUNTER — Emergency Department (HOSPITAL_COMMUNITY): Payer: Non-veteran care

## 2016-01-07 ENCOUNTER — Encounter (HOSPITAL_COMMUNITY): Payer: Self-pay | Admitting: Emergency Medicine

## 2016-01-07 DIAGNOSIS — F17210 Nicotine dependence, cigarettes, uncomplicated: Secondary | ICD-10-CM | POA: Insufficient documentation

## 2016-01-07 DIAGNOSIS — I252 Old myocardial infarction: Secondary | ICD-10-CM | POA: Insufficient documentation

## 2016-01-07 DIAGNOSIS — R0602 Shortness of breath: Secondary | ICD-10-CM | POA: Diagnosis not present

## 2016-01-07 DIAGNOSIS — R079 Chest pain, unspecified: Secondary | ICD-10-CM | POA: Diagnosis not present

## 2016-01-07 HISTORY — DX: Acute myocardial infarction, unspecified: I21.9

## 2016-01-07 LAB — BASIC METABOLIC PANEL
ANION GAP: 15 (ref 5–15)
BUN: 7 mg/dL (ref 6–20)
CO2: 26 mmol/L (ref 22–32)
Calcium: 9.9 mg/dL (ref 8.9–10.3)
Chloride: 97 mmol/L — ABNORMAL LOW (ref 101–111)
Creatinine, Ser: 0.8 mg/dL (ref 0.61–1.24)
GFR calc Af Amer: >60 mL/min (ref >60)
Glucose, Bld: 92 mg/dL (ref 65–99)
POTASSIUM: 4.5 mmol/L (ref 3.5–5.1)
Sodium: 138 mmol/L (ref 135–145)

## 2016-01-07 LAB — CBC
HEMATOCRIT: 48.2 % (ref 39.0–52.0)
HEMOGLOBIN: 16.9 g/dL (ref 13.0–17.0)
MCH: 32.9 pg (ref 26.0–34.0)
MCHC: 35.1 g/dL (ref 30.0–36.0)
MCV: 93.8 fL (ref 78.0–100.0)
Platelets: 111 10*3/uL — ABNORMAL LOW (ref 150–400)
RBC: 5.14 MIL/uL (ref 4.22–5.81)
RDW: 14.6 % (ref 11.5–15.5)
WBC: 6.3 10*3/uL (ref 4.0–10.5)

## 2016-01-07 LAB — PROTIME-INR
INR: 1.05 (ref 0.00–1.49)
Prothrombin Time: 13.5 seconds (ref 11.6–15.2)

## 2016-01-07 LAB — I-STAT TROPONIN, ED: Troponin i, poc: 0 ng/mL (ref 0.00–0.08)

## 2016-01-07 NOTE — ED Notes (Addendum)
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  [facility]  Began having central CP today about 2 hrs ago.  Pt states he was SOB.  Took 1 nitro about 45 minutes ago.  States that pain is beginning to subside.  Pt states he was taken off all of his meds--including blood thinner last Sunday in preparation for a colonoscopy.

## 2016-01-07 NOTE — ED Notes (Signed)
Called for pt x 2.  Not found in lobby 

## 2016-06-14 ENCOUNTER — Emergency Department (HOSPITAL_COMMUNITY): Payer: Non-veteran care

## 2016-06-14 ENCOUNTER — Encounter (HOSPITAL_COMMUNITY): Payer: Self-pay | Admitting: *Deleted

## 2016-06-14 ENCOUNTER — Observation Stay (HOSPITAL_COMMUNITY)
Admission: EM | Admit: 2016-06-14 | Discharge: 2016-06-15 | Disposition: A | Discharge status: Home / Self Care | Payer: Non-veteran care | Attending: Internal Medicine | Admitting: Internal Medicine

## 2016-06-14 ENCOUNTER — Observation Stay (HOSPITAL_BASED_OUTPATIENT_CLINIC_OR_DEPARTMENT_OTHER): Payer: Non-veteran care

## 2016-06-14 DIAGNOSIS — Z880 Allergy status to penicillin: Secondary | ICD-10-CM | POA: Insufficient documentation

## 2016-06-14 DIAGNOSIS — Z8249 Family history of ischemic heart disease and other diseases of the circulatory system: Secondary | ICD-10-CM | POA: Insufficient documentation

## 2016-06-14 DIAGNOSIS — R51 Headache: Secondary | ICD-10-CM | POA: Insufficient documentation

## 2016-06-14 DIAGNOSIS — R519 Headache, unspecified: Secondary | ICD-10-CM

## 2016-06-14 DIAGNOSIS — R079 Chest pain, unspecified: Secondary | ICD-10-CM

## 2016-06-14 DIAGNOSIS — I2510 Atherosclerotic heart disease of native coronary artery without angina pectoris: Secondary | ICD-10-CM | POA: Insufficient documentation

## 2016-06-14 DIAGNOSIS — Z7902 Long term (current) use of antithrombotics/antiplatelets: Secondary | ICD-10-CM | POA: Insufficient documentation

## 2016-06-14 DIAGNOSIS — I252 Old myocardial infarction: Secondary | ICD-10-CM | POA: Insufficient documentation

## 2016-06-14 DIAGNOSIS — Z79899 Other long term (current) drug therapy: Secondary | ICD-10-CM | POA: Insufficient documentation

## 2016-06-14 DIAGNOSIS — F17210 Nicotine dependence, cigarettes, uncomplicated: Secondary | ICD-10-CM | POA: Insufficient documentation

## 2016-06-14 DIAGNOSIS — Z7982 Long term (current) use of aspirin: Secondary | ICD-10-CM | POA: Insufficient documentation

## 2016-06-14 LAB — RAPID URINE DRUG SCREEN, HOSP PERFORMED
Amphetamines: NOT DETECTED
Barbiturates: NOT DETECTED
Benzodiazepines: NOT DETECTED
Cocaine: NOT DETECTED
OPIATES: NOT DETECTED
TETRAHYDROCANNABINOL: NOT DETECTED

## 2016-06-14 LAB — CBC WITH DIFFERENTIAL/PLATELET
BASOS PCT: 0 %
Basophils Absolute: 0 10*3/uL (ref 0.0–0.1)
EOS ABS: 0.3 10*3/uL (ref 0.0–0.7)
EOS PCT: 5 %
HCT: 40.9 % (ref 39.0–52.0)
Hemoglobin: 13.6 g/dL (ref 13.0–17.0)
LYMPHS ABS: 2.4 10*3/uL (ref 0.7–4.0)
Lymphocytes Relative: 44 %
MCH: 31.4 pg (ref 26.0–34.0)
MCHC: 33.3 g/dL (ref 30.0–36.0)
MCV: 94.5 fL (ref 78.0–100.0)
MONO ABS: 0.4 10*3/uL (ref 0.1–1.0)
MONOS PCT: 8 %
Neutro Abs: 2.4 10*3/uL (ref 1.7–7.7)
Neutrophils Relative %: 43 %
PLATELETS: 148 10*3/uL — AB (ref 150–400)
RBC: 4.33 MIL/uL (ref 4.22–5.81)
RDW: 14.7 % (ref 11.5–15.5)
WBC: 5.5 10*3/uL (ref 4.0–10.5)

## 2016-06-14 LAB — I-STAT TROPONIN, ED: TROPONIN I, POC: 0 ng/mL (ref 0.00–0.08)

## 2016-06-14 LAB — BASIC METABOLIC PANEL
ANION GAP: 10 (ref 5–15)
BUN: 6 mg/dL (ref 6–20)
CHLORIDE: 101 mmol/L (ref 101–111)
CO2: 25 mmol/L (ref 22–32)
Calcium: 9.1 mg/dL (ref 8.9–10.3)
Creatinine, Ser: 0.87 mg/dL (ref 0.61–1.24)
GFR calc Af Amer: >60 mL/min (ref >60)
Glucose, Bld: 91 mg/dL (ref 65–99)
POTASSIUM: 4.4 mmol/L (ref 3.5–5.1)
SODIUM: 136 mmol/L (ref 135–145)

## 2016-06-14 LAB — TROPONIN I
Troponin I: <0.03 ng/mL (ref <0.03)
Troponin I: <0.03 ng/mL (ref <0.03)

## 2016-06-14 LAB — ECHOCARDIOGRAM COMPLETE
Height: 71 in
WEIGHTICAEL: 2688 [oz_av]

## 2016-06-14 MED ORDER — ENOXAPARIN SODIUM 40 MG/0.4ML ~~LOC~~ SOLN
40.0000 mg | SUBCUTANEOUS | Status: DC
  Administered 2016-06-14: 40 mg via SUBCUTANEOUS
  Filled 2016-06-14 (×2): qty 0.4

## 2016-06-14 MED ORDER — ATORVASTATIN CALCIUM 80 MG PO TABS
80.0000 mg | ORAL_TABLET | Freq: Every day | ORAL | Status: DC
  Administered 2016-06-14: 80 mg via ORAL
  Filled 2016-06-14: qty 1

## 2016-06-14 MED ORDER — CLOPIDOGREL BISULFATE 75 MG PO TABS
75.0000 mg | ORAL_TABLET | Freq: Every day | ORAL | Status: DC
  Administered 2016-06-14 – 2016-06-15 (×2): 75 mg via ORAL
  Filled 2016-06-14 (×2): qty 1

## 2016-06-14 MED ORDER — POLYVINYL ALCOHOL 1.4 % OP SOLN: 1.0000 [drp] | OPHTHALMIC | Status: DC | PRN

## 2016-06-14 MED ORDER — METOCLOPRAMIDE HCL 5 MG/ML IJ SOLN: 10.0000 mg | Freq: Once | INTRAMUSCULAR | Status: DC

## 2016-06-14 MED ORDER — ISOSORBIDE MONONITRATE ER 60 MG PO TB24
60.0000 mg | ORAL_TABLET | Freq: Every day | ORAL | Status: DC
  Filled 2016-06-14 (×2): qty 1

## 2016-06-14 MED ORDER — NITROGLYCERIN 0.4 MG SL SUBL
0.4000 mg | SUBLINGUAL_TABLET | SUBLINGUAL | Status: DC | PRN
  Administered 2016-06-14 (×2): 0.4 mg via SUBLINGUAL
  Filled 2016-06-14: qty 1

## 2016-06-14 MED ORDER — TAMSULOSIN HCL 0.4 MG PO CAPS
0.4000 mg | ORAL_CAPSULE | Freq: Every day | ORAL | Status: DC
  Administered 2016-06-14: 0.4 mg via ORAL
  Filled 2016-06-14: qty 1

## 2016-06-14 MED ORDER — ASPIRIN 325 MG PO TABS
325.0000 mg | ORAL_TABLET | Freq: Once | ORAL | Status: AC
  Administered 2016-06-14: 325 mg via ORAL
  Filled 2016-06-14: qty 1

## 2016-06-14 MED ORDER — ONDANSETRON HCL 4 MG/2ML IJ SOLN: 4.0000 mg | Freq: Four times a day (QID) | INTRAMUSCULAR | Status: DC | PRN

## 2016-06-14 MED ORDER — ACETAMINOPHEN 325 MG PO TABS: 650.0000 mg | ORAL_TABLET | ORAL | Status: DC | PRN

## 2016-06-14 MED ORDER — ACETAMINOPHEN 325 MG PO TABS: 325.0000 mg | ORAL_TABLET | Freq: Four times a day (QID) | ORAL | Status: DC | PRN

## 2016-06-14 MED ORDER — KETOROLAC TROMETHAMINE 30 MG/ML IJ SOLN
30.0000 mg | Freq: Once | INTRAMUSCULAR | Status: AC
  Administered 2016-06-14: 30 mg via INTRAVENOUS
  Filled 2016-06-14: qty 1

## 2016-06-14 MED ORDER — DIPHENHYDRAMINE HCL 50 MG/ML IJ SOLN: 50.0000 mg | Freq: Once | INTRAMUSCULAR | Status: DC

## 2016-06-14 MED ORDER — ASPIRIN EC 81 MG PO TBEC
81.0000 mg | DELAYED_RELEASE_TABLET | Freq: Every day | ORAL | Status: DC
  Administered 2016-06-14: 81 mg via ORAL
  Filled 2016-06-14: qty 1

## 2016-06-14 MED ORDER — METOPROLOL TARTRATE 50 MG PO TABS
50.0000 mg | ORAL_TABLET | Freq: Two times a day (BID) | ORAL | Status: DC
  Administered 2016-06-14 (×2): 50 mg via ORAL
  Filled 2016-06-14 (×2): qty 1

## 2016-06-14 NOTE — ED Provider Notes (Signed)
[facility]-EMERGENCY DEPT Provider Note   CSN: [id number] Arrival date & time: 06/14/16  0028  First Provider Contact:  First MD Initiated Contact with Patient 06/14/16 0346   By signing my name below, I, Bridgette Habermann, attest that this documentation has been prepared under the direction and in the presence of Tomasita Crumble, MD. Electronically Signed: Bridgette Habermann, ED Scribe. 06/14/16. 4:03 AM.  History   Chief Complaint Chief Complaint  Patient presents with  . Headache   HPI Comments: William Baxter is a 55 y.o. male with h/o MI and heart disease who presents to the Emergency Department complaining of sudden onset, constant, gradually worsening, pressurized, 10/10 headache onset yesterday morning. Pt also has associated intermittent shortness of breath and chest pain. He reports pain is exacerbated with movement. Pt notes that since his MI, he has been getting migraines more frequently. He states the migraines may be secondary to one his medications. He states his symptoms at this time do not feel like his previous MI. Pt has taken his prescribed NTG with no relief to the headache. His last dose was 4 days ago. Pt also denies fever, cough, rhinorrhea, and nausea and vomiting.   The history is provided by the patient. No language interpreter was used.    Past Medical History:  Diagnosis Date  . Heart disease   . Medical history non-contributory   . Myocardial infarction acute [facility])     Patient Active Problem List   Diagnosis Date Noted  . Acute coronary syndrome (HCC) 12/16/2013    History reviewed. No pertinent surgical history.   Home Medications    Prior to Admission medications   Medication Sig Start Date End Date Taking? Authorizing Provider  acetaminophen (TYLENOL) 325 MG tablet Take 325 mg by mouth every morning.    Historical Provider, MD  aspirin EC 81 MG EC tablet Take 1 tablet (81 mg total) by mouth daily. 12/19/13   Orpah Cobb, MD  atorvastatin (LIPITOR) 80 MG tablet Take  1 tablet (80 mg total) by mouth daily at 6 PM. 12/19/13   Orpah Cobb, MD  clopidogrel (PLAVIX) 75 MG tablet Take 1 tablet (75 mg total) by mouth daily with breakfast. 12/19/13   Orpah Cobb, MD  lisinopril (ZESTRIL) 2.5 MG tablet Take 1 tablet (2.5 mg total) by mouth daily. 12/19/13   Orpah Cobb, MD  metoprolol tartrate (LOPRESSOR) 12.5 mg TABS tablet Take 0.5 tablets (12.5 mg total) by mouth 2 (two) times daily. 12/19/13   Orpah Cobb, MD  nitroGLYCERIN (NITROSTAT) 0.4 MG SL tablet Place 1 tablet (0.4 mg total) under the tongue every 5 (five) minutes as needed for chest pain. 12/19/13   Orpah Cobb, MD    Family History No family history on file.  Social History Social History  Substance Use Topics  . Smoking status: Current Every Day Smoker    Packs/day: 0.00    Types: Cigarettes  . Smokeless tobacco: Never Used  . Alcohol use Yes     Comment: occ     Allergies   Penicillins Review of Systems Review of Systems 10 Systems reviewed and all are negative for acute change except as noted in the HPI. Physical Exam Updated Vital Signs BP 100/80   Pulse 67   Temp 97.9 F (36.6 C) (Oral)   Resp 18   SpO2 96%   Physical Exam  Constitutional: He is oriented to person, place, and time. Vital signs are normal. He appears well-developed and well-nourished.  Non-toxic appearance. He does not  appear ill. No distress.  HENT:  Head: Normocephalic and atraumatic.  Nose: Nose normal.  Mouth/Throat: Oropharynx is clear and moist. No oropharyngeal exudate.  Eyes: Conjunctivae and EOM are normal. Pupils are equal, round, and reactive to light. No scleral icterus.  Neck: Normal range of motion. Neck supple. No tracheal deviation, no edema, no erythema and normal range of motion present. No thyroid mass and no thyromegaly present.  Cardiovascular: Normal rate, regular rhythm, S1 normal, S2 normal, normal heart sounds, intact distal pulses and normal pulses.  Exam reveals no gallop and no  friction rub.   No murmur heard. Pulmonary/Chest: Effort normal and breath sounds normal. No respiratory distress. He has no wheezes. He has no rhonchi. He has no rales.  Abdominal: Soft. Normal appearance and bowel sounds are normal. He exhibits no distension, no ascites and no mass. There is no hepatosplenomegaly. There is no tenderness. There is no rebound, no guarding and no CVA tenderness.  Musculoskeletal: Normal range of motion. He exhibits no edema or tenderness.  Normal strength and sensation in all extremities.  Lymphadenopathy:    He has no cervical adenopathy.  Neurological: He is alert and oriented to person, place, and time. He has normal strength. No cranial nerve deficit or sensory deficit.  Normal cerebellar testing.  Skin: Skin is warm, dry and intact. No petechiae and no rash noted. He is not diaphoretic. No erythema. No pallor.  Nursing note and vitals reviewed.    ED Treatments / Results  DIAGNOSTIC STUDIES: Oxygen Saturation is 100% on RA, normal by my interpretation.    COORDINATION OF CARE: 3:51 AM Discussed treatment plan with pt at bedside which includes blood work, EKG, and CXR and pt agreed to plan.  Labs (all labs ordered are listed, but only abnormal results are displayed) Labs Reviewed  CBC WITH DIFFERENTIAL/PLATELET - Abnormal; Notable for the following:       Result Value   Platelets 148 (*)    All other components within normal limits  BASIC METABOLIC PANEL  I-STAT TROPOININ, ED    EKG  EKG Interpretation  Date/Time:  Sunday June 14 2016 04:25:20 EDT Ventricular Rate:  64 PR Interval:    QRS Duration: 105 QT Interval:  427 QTC Calculation: 441 R Axis:   84 Text Interpretation:  Sinus rhythm Probable left ventricular hypertrophy ST elevation suggests acute pericarditis No significant change since last tracing Confirmed by Erroll Lunani, Willia Lampert Ayokunle [phone]([id number]) on 06/14/2016 5:20:09 AM       Radiology Dg Chest 2 View  Result Date:  06/14/2016 CLINICAL DATA:  Acute onset of headache.  Initial encounter. EXAM: CHEST  2 VIEW COMPARISON:  Chest radiograph performed 01/07/2016 FINDINGS: The lungs are well-aerated. Mild vascular congestion is noted. There is no evidence of focal opacification, pleural effusion or pneumothorax. The heart is normal in size; the mediastinal contour is within normal limits. No acute osseous abnormalities are seen. IMPRESSION: Mild vascular congestion noted.  Lungs remain grossly clear. Electronically Signed   By: Roanna RaiderJeffery  Chang M.D.   On: 06/14/2016 05:03    Procedures Procedures (including critical care time)  Medications Ordered in ED Medications  ketorolac (TORADOL) 30 MG/ML injection 30 mg (30 mg Intravenous Given 06/14/16 0426)  aspirin tablet 325 mg (325 mg Oral Given 06/14/16 0427)     Initial Impression / Assessment and Plan / ED Course  I have reviewed the triage vital signs and the nursing notes.  Pertinent labs & imaging results that were available during my care of  the patient were reviewed by me and considered in my medical decision making (see chart for details).  Clinical Course    Patient presents to the ED for headache.  This is likely caused by his home imdur and nitro.  I have more concern regarding the patients chest pain and frequent use of nitro. He states, at times, his pain feels like his prior MI. He will likely require admission for work up. He was given full dose aspirin in the ED.  Troponin is negative.  CXR and EKG are unremarkable.  Will call hospitalist for further care.  Final Clinical Impressions(s) / ED Diagnoses   Final diagnoses:  None    New Prescriptions New Prescriptions   No medications on file     I personally performed the services described in this documentation, which was scribed in my presence. The recorded information has been reviewed and is accurate.      Tomasita Crumble, MD 06/14/16 [phone]

## 2016-06-14 NOTE — ED Notes (Signed)
Patient transported to CT 

## 2016-06-14 NOTE — ED Notes (Signed)
Report attempted, RN unable to get report at this time. 

## 2016-06-14 NOTE — Progress Notes (Signed)
Pt started having chest pain at 1710. Pt left side of chest hurting nitro given. BP 137/67 O2 100% pt felt Short of breath 2L O2 applied pt rated pain 9/10. Pt given second nitro at 1719 with pain rated 5/10. Bp 120/75 O2 sats 100% on 2L. Pt described pain starting from chest and radiating to stomach.  MD notified. Toradol ordered. Pt now resting rating pain as a 3 and said it tolerable. Will continue to monitor pt.

## 2016-06-14 NOTE — Progress Notes (Signed)
  Echocardiogram 2D Echocardiogram has been performed.  Janalyn HarderWest, Janashia Parco R 06/14/2016, 4:01 PM

## 2016-06-14 NOTE — H&P (Signed)
History and Physical    William Baxter ZOX:[id number] DOB: [birth year] DOA: 06/14/2016   PCP: No PCP Per Patient Chief Complaint:  Chief Complaint  Patient presents with  . Headache    HPI: William Baxter is a 55 y.o. male with medical history significant of CAD, MI in 2015.  Patient presents to the ED with c/o headache.  He has been having intermittent headaches since 2015.  He has also been having intermittent episodes of chest pain since that time.  CP worse with exertion, better with NTG ("but it gives me a headache").  ED Course: Trop negative.  Review of Systems: As per HPI otherwise 10 point review of systems negative.    Past Medical History:  Diagnosis Date  . Heart disease   . Medical history non-contributory   . Myocardial infarction acute [facility])     History reviewed. No pertinent surgical history.   reports that he has been smoking Cigarettes.  He has been smoking about 0.00 packs per day. He has never used smokeless tobacco. He reports that he drinks alcohol. He reports that he does not use drugs.  Allergies  Allergen Reactions  . Penicillins Nausea And Vomiting    No family history on file. Has family history of early onset CAD (himself too)   Prior to Admission medications   Medication Sig Start Date End Date Taking? Authorizing Provider  acetaminophen (TYLENOL) 325 MG tablet Take 325 mg by mouth every 6 (six) hours as needed for mild pain.    Yes Historical Provider, MD  aspirin EC 81 MG EC tablet Take 1 tablet (81 mg total) by mouth daily. 12/19/13  Yes Orpah Cobb, MD  atorvastatin (LIPITOR) 80 MG tablet Take 1 tablet (80 mg total) by mouth daily at 6 PM. 12/19/13  Yes Orpah Cobb, MD  clopidogrel (PLAVIX) 75 MG tablet Take 1 tablet (75 mg total) by mouth daily with breakfast. 12/19/13  Yes Orpah Cobb, MD  isosorbide mononitrate (IMDUR) 60 MG 24 hr tablet Take 60 mg by mouth daily.   Yes Historical Provider, MD  metoprolol (LOPRESSOR) 50 MG tablet  Take 50 mg by mouth 2 (two) times daily.   Yes Historical Provider, MD  nitroGLYCERIN (NITROSTAT) 0.4 MG SL tablet Place 1 tablet (0.4 mg total) under the tongue every 5 (five) minutes as needed for chest pain. 12/19/13  Yes Orpah Cobb, MD  polyvinyl alcohol (LIQUIFILM TEARS) 1.4 % ophthalmic solution Place 1 drop into both eyes as needed for dry eyes.   Yes Historical Provider, MD  tamsulosin (FLOMAX) 0.4 MG CAPS capsule Take 0.4 mg by mouth daily.   Yes Historical Provider, MD    Physical Exam: Vitals:   06/14/16 0055 06/14/16 0345  BP: 92/63 100/80  Pulse: 71 67  Resp: 18   Temp: 97.9 F (36.6 C)   TempSrc: Oral   SpO2: 100% 96%      Constitutional: NAD, calm, comfortable Eyes: PERRL, lids and conjunctivae normal ENMT: Mucous membranes are moist. Posterior pharynx clear of any exudate or lesions.Normal dentition.  Neck: normal, supple, no masses, no thyromegaly Respiratory: clear to auscultation bilaterally, no wheezing, no crackles. Normal respiratory effort. No accessory muscle use.  Cardiovascular: Regular rate and rhythm, no murmurs / rubs / gallops. No extremity edema. 2+ pedal pulses. No carotid bruits.  Abdomen: no tenderness, no masses palpated. No hepatosplenomegaly. Bowel sounds positive.  Musculoskeletal: no clubbing / cyanosis. No joint deformity upper and lower extremities. Good ROM, no contractures. Normal muscle tone.  Skin: no rashes, lesions, ulcers. No induration Neurologic: CN 2-12 grossly intact. Sensation intact, DTR normal. Strength 5/5 in all 4.  Psychiatric: Normal judgment and insight. Alert and oriented x 3. Normal mood.    Labs on Admission: I have personally reviewed following labs and imaging studies  CBC:  Recent Labs Lab 06/14/16 0418  WBC 5.5  NEUTROABS 2.4  HGB 13.6  HCT 40.9  MCV 94.5  PLT 148*   Basic Metabolic Panel:  Recent Labs Lab 06/14/16 0418  NA 136  K 4.4  CL 101  CO2 25  GLUCOSE 91  BUN 6  CREATININE 0.87    CALCIUM 9.1   GFR: CrCl cannot be calculated (Unknown ideal weight.). Liver Function Tests: No results for input(s): AST, ALT, ALKPHOS, BILITOT, PROT, ALBUMIN in the last 168 hours. No results for input(s): LIPASE, AMYLASE in the last 168 hours. No results for input(s): AMMONIA in the last 168 hours. Coagulation Profile: No results for input(s): INR, PROTIME in the last 168 hours. Cardiac Enzymes: No results for input(s): CKTOTAL, CKMB, CKMBINDEX, TROPONINI in the last 168 hours. BNP (last 3 results) No results for input(s): PROBNP in the last 8760 hours. HbA1C: No results for input(s): HGBA1C in the last 72 hours. CBG: No results for input(s): GLUCAP in the last 168 hours. Lipid Profile: No results for input(s): CHOL, HDL, LDLCALC, TRIG, CHOLHDL, LDLDIRECT in the last 72 hours. Thyroid Function Tests: No results for input(s): TSH, T4TOTAL, FREET4, T3FREE, THYROIDAB in the last 72 hours. Anemia Panel: No results for input(s): VITAMINB12, FOLATE, FERRITIN, TIBC, IRON, RETICCTPCT in the last 72 hours. Urine analysis: No results found for: COLORURINE, APPEARANCEUR, LABSPEC, PHURINE, GLUCOSEU, HGBUR, BILIRUBINUR, KETONESUR, PROTEINUR, UROBILINOGEN, NITRITE, LEUKOCYTESUR Sepsis Labs: @LABRCNTIP (procalcitonin:4,lacticidven:4) )No results found for this or any previous visit (from the past 240 hour(s)).   Radiological Exams on Admission: Dg Chest 2 View  Result Date: 06/14/2016 CLINICAL DATA:  Acute onset of headache.  Initial encounter. EXAM: CHEST  2 VIEW COMPARISON:  Chest radiograph performed 01/07/2016 FINDINGS: The lungs are well-aerated. Mild vascular congestion is noted. There is no evidence of focal opacification, pleural effusion or pneumothorax. The heart is normal in size; the mediastinal contour is within normal limits. No acute osseous abnormalities are seen. IMPRESSION: Mild vascular congestion noted.  Lungs remain grossly clear. Electronically Signed   By: Roanna RaiderJeffery  Chang  M.D.   On: 06/14/2016 05:03    EKG: Independently reviewed.  Assessment/Plan Principal Problem:   Chest pain with moderate risk for cardiac etiology Active Problems:   Chest pain, moderate coronary artery risk    1. Chest pain - heart score of 6 1. Findings concerning for ongoing angina despite medical therapy over the past 2 years. 2. Chest pain obs 3. NPO 4. Serial trops 5. Tele monitor 6. Likely warrants cards eval given history 2. Headache - almost certainly secondary to nitrate use for chest pain   DVT prophylaxis: Lovenox Code Status: Full Family Communication: Wife at bedside Consults called: None Admission status: Admit to obs   Valerya Maxton, Heywood IlesJARED M. DO [facility] Pager [phone] from 7PM-7AM  If 7AM-7PM, please contact the day physician for the patient [email or web address] Password [email or web address]  06/14/2016, 5:53 AM

## 2016-06-14 NOTE — ED Triage Notes (Signed)
The pt is c/o  A headache since this am  No n v  Hx of the the same

## 2016-06-14 NOTE — Progress Notes (Signed)
Patient admitted after midnight, please see H&P.  Chest pain is reproducible.  Cycle CE and get echo.  +tobacco use and cough.  Denies illegal drugs. Hope for d/c in AM Marlin CanaryJessica Saniya Tranchina DO

## 2016-06-14 NOTE — Progress Notes (Addendum)
Patient arrived on the unit from the ED, vital signs obtained see flowsheet,placed on tele, ccmd notified, patient oriented to room and staff, bed in lowest position, call light within reach, will continue to monitor

## 2016-06-15 DIAGNOSIS — Z720 Tobacco use: Secondary | ICD-10-CM

## 2016-06-15 DIAGNOSIS — R079 Chest pain, unspecified: Secondary | ICD-10-CM | POA: Diagnosis not present

## 2016-06-15 MED ORDER — ALBUTEROL SULFATE HFA 108 (90 BASE) MCG/ACT IN AERS: 2.0000 | INHALATION_SPRAY | Freq: Four times a day (QID) | RESPIRATORY_TRACT | 2 refills | Status: AC | PRN

## 2016-06-15 MED ORDER — METOPROLOL TARTRATE 25 MG PO TABS: 25.0000 mg | ORAL_TABLET | Freq: Two times a day (BID) | ORAL | 0 refills | Status: AC

## 2016-06-15 NOTE — Discharge Summary (Signed)
Physician Discharge Summary  William Baxter ZOX:[id number] DOB: [birth year] DOA: 06/14/2016  PCP: No PCP Per Patient  Admit date: 06/14/2016 Discharge date: 06/15/2016   Recommendations for Outpatient Follow-Up:   1. Smoking cessation 2. [facility]   Discharge Diagnosis:   Principal Problem:   Chest pain with moderate risk for cardiac etiology Active Problems:   Chest pain, moderate coronary artery risk   Headache   Discharge disposition:  Home.  Discharge Condition: Improved.  Diet recommendation: Low sodium, heart healthy..  Wound care: None.   History of Present Illness:   William Baxter is a 55 y.o. male with medical history significant of CAD, MI in 2015.  Patient presents to the ED with c/o headache.  He has been having intermittent headaches since 2015.  He has also been having intermittent episodes of chest pain since that time.  CP worse with exertion, better with NTG ("but it gives me a headache").    Hospital Course by Problem:   Chest pain -CE negative -echo similar to past -resolved -outpatient follow up -needs to stop smoking -outpatient PFTs  Tobacco abuse -encourage cessation    Medical Consultants:    None.   Discharge Exam:   Vitals:   06/14/16 2034 06/15/16 0350  BP: 103/61 106/63  Pulse: 64 65  Resp: 20 20  Temp: 98.2 F (36.8 C) 98.2 F (36.8 C)   Vitals:   06/14/16 1719 06/14/16 1724 06/14/16 2034 06/15/16 0350  BP: 120/75 128/75 103/61 106/63  Pulse:  72 64 65  Resp:   20 20  Temp:   98.2 F (36.8 C) 98.2 F (36.8 C)  TempSrc:   Oral Oral  SpO2: 100% 100% 100% 100%  Weight:      Height:        Gen:  NAD    The results of significant diagnostics from this hospitalization (including imaging, microbiology, ancillary and laboratory) are listed below for reference.     Procedures and Diagnostic Studies:   Dg Chest 2 View  Result Date: 06/14/2016 CLINICAL DATA:  Acute onset  of headache.  Initial encounter. EXAM: CHEST  2 VIEW COMPARISON:  Chest radiograph performed 01/07/2016 FINDINGS: The lungs are well-aerated. Mild vascular congestion is noted. There is no evidence of focal opacification, pleural effusion or pneumothorax. The heart is normal in size; the mediastinal contour is within normal limits. No acute osseous abnormalities are seen. IMPRESSION: Mild vascular congestion noted.  Lungs remain grossly clear. Electronically Signed   By: Roanna RaiderJeffery  Chang M.D.   On: 06/14/2016 05:03     Labs:   Basic Metabolic Panel:  Recent Labs Lab 06/14/16 0418  NA 136  K 4.4  CL 101  CO2 25  GLUCOSE 91  BUN 6  CREATININE 0.87  CALCIUM 9.1   GFR Estimated Creatinine Clearance: 101 mL/min (by C-G formula based on SCr of 0.87 mg/dL). Liver Function Tests: No results for input(s): AST, ALT, ALKPHOS, BILITOT, PROT, ALBUMIN in the last 168 hours. No results for input(s): LIPASE, AMYLASE in the last 168 hours. No results for input(s): AMMONIA in the last 168 hours. Coagulation profile No results for input(s): INR, PROTIME in the last 168 hours.  CBC:  Recent Labs Lab 06/14/16 0418  WBC 5.5  NEUTROABS 2.4  HGB 13.6  HCT 40.9  MCV 94.5  PLT 148*   Cardiac Enzymes:  Recent Labs Lab 06/14/16 0757 06/14/16 1500 06/14/16 1940  TROPONINI <0.03 <0.03 <0.03   BNP:  Invalid input(s): POCBNP CBG: No results for input(s): GLUCAP in the last 168 hours. D-Dimer No results for input(s): DDIMER in the last 72 hours. Hgb A1c No results for input(s): HGBA1C in the last 72 hours. Lipid Profile No results for input(s): CHOL, HDL, LDLCALC, TRIG, CHOLHDL, LDLDIRECT in the last 72 hours. Thyroid function studies No results for input(s): TSH, T4TOTAL, T3FREE, THYROIDAB in the last 72 hours.  Invalid input(s): FREET3 Anemia work up No results for input(s): VITAMINB12, FOLATE, FERRITIN, TIBC, IRON, RETICCTPCT in the last 72 hours. Microbiology No results found for  this or any previous visit (from the past 240 hour(s)).   Discharge Instructions:   Discharge Instructions    Diet - low sodium heart healthy    Complete by:  As directed   Discharge instructions    Complete by:  As directed   PFTs outpatient Stop smoking   Increase activity slowly    Complete by:  As directed       Medication List    TAKE these medications   acetaminophen 325 MG tablet Commonly known as:  TYLENOL Take 325 mg by mouth every 6 (six) hours as needed for mild pain.   albuterol 108 (90 Base) MCG/ACT inhaler Commonly known as:  PROVENTIL HFA;VENTOLIN HFA Inhale 2 puffs into the lungs every 6 (six) hours as needed for wheezing or shortness of breath.   aspirin 81 MG EC tablet Take 1 tablet (81 mg total) by mouth daily.   atorvastatin 80 MG tablet Commonly known as:  LIPITOR Take 1 tablet (80 mg total) by mouth daily at 6 PM.   clopidogrel 75 MG tablet Commonly known as:  PLAVIX Take 1 tablet (75 mg total) by mouth daily with breakfast.   isosorbide mononitrate 60 MG 24 hr tablet Commonly known as:  IMDUR Take 60 mg by mouth daily.   metoprolol tartrate 25 MG tablet Commonly known as:  LOPRESSOR Take 1 tablet (25 mg total) by mouth 2 (two) times daily. What changed:  medication strength  how much to take   nitroGLYCERIN 0.4 MG SL tablet Commonly known as:  NITROSTAT Place 1 tablet (0.4 mg total) under the tongue every 5 (five) minutes as needed for chest pain.   polyvinyl alcohol 1.4 % ophthalmic solution Commonly known as:  LIQUIFILM TEARS Place 1 drop into both eyes as needed for dry eyes.   tamsulosin 0.4 MG Caps capsule Commonly known as:  FLOMAX Take 0.4 mg by mouth daily.      Follow-up Information    Physician at the [facility] .            Time coordinating discharge: 35 min  Signed:  Victorhugo Preis U Kataleia Quaranta   [facility] 06/15/2016, 7:58 AM

## 2016-12-07 ENCOUNTER — Encounter (HOSPITAL_BASED_OUTPATIENT_CLINIC_OR_DEPARTMENT_OTHER): Payer: Self-pay

## 2016-12-07 ENCOUNTER — Emergency Department (HOSPITAL_BASED_OUTPATIENT_CLINIC_OR_DEPARTMENT_OTHER)
Admission: EM | Admit: 2016-12-07 | Discharge: 2016-12-07 | Disposition: A | Discharge status: Home / Self Care | Payer: Non-veteran care | Attending: Emergency Medicine | Admitting: Emergency Medicine

## 2016-12-07 ENCOUNTER — Emergency Department (HOSPITAL_BASED_OUTPATIENT_CLINIC_OR_DEPARTMENT_OTHER): Payer: Non-veteran care

## 2016-12-07 DIAGNOSIS — J40 Bronchitis, not specified as acute or chronic: Secondary | ICD-10-CM | POA: Insufficient documentation

## 2016-12-07 DIAGNOSIS — I252 Old myocardial infarction: Secondary | ICD-10-CM | POA: Insufficient documentation

## 2016-12-07 DIAGNOSIS — R05 Cough: Secondary | ICD-10-CM | POA: Diagnosis present

## 2016-12-07 DIAGNOSIS — Z79899 Other long term (current) drug therapy: Secondary | ICD-10-CM | POA: Diagnosis not present

## 2016-12-07 DIAGNOSIS — I2510 Atherosclerotic heart disease of native coronary artery without angina pectoris: Secondary | ICD-10-CM | POA: Insufficient documentation

## 2016-12-07 DIAGNOSIS — Z7982 Long term (current) use of aspirin: Secondary | ICD-10-CM | POA: Insufficient documentation

## 2016-12-07 LAB — COMPREHENSIVE METABOLIC PANEL
ALBUMIN: 4.1 g/dL (ref 3.5–5.0)
ALK PHOS: 55 U/L (ref 38–126)
ALT: 19 U/L (ref 17–63)
AST: 41 U/L (ref 15–41)
Anion gap: 10 (ref 5–15)
BILIRUBIN TOTAL: 0.6 mg/dL (ref 0.3–1.2)
BUN: 5 mg/dL — AB (ref 6–20)
CALCIUM: 9 mg/dL (ref 8.9–10.3)
CO2: 24 mmol/L (ref 22–32)
CREATININE: 0.84 mg/dL (ref 0.61–1.24)
Chloride: 98 mmol/L — ABNORMAL LOW (ref 101–111)
GFR calc Af Amer: >60 mL/min (ref >60)
GLUCOSE: 103 mg/dL — AB (ref 65–99)
Potassium: 3.8 mmol/L (ref 3.5–5.1)
Sodium: 132 mmol/L — ABNORMAL LOW (ref 135–145)
TOTAL PROTEIN: 8.1 g/dL (ref 6.5–8.1)

## 2016-12-07 LAB — CBC WITH DIFFERENTIAL/PLATELET
BASOS ABS: 0 10*3/uL (ref 0.0–0.1)
BASOS PCT: 0 %
Eosinophils Absolute: 0 10*3/uL (ref 0.0–0.7)
Eosinophils Relative: 1 %
HEMATOCRIT: 42.9 % (ref 39.0–52.0)
HEMOGLOBIN: 14.9 g/dL (ref 13.0–17.0)
LYMPHS PCT: 24 %
Lymphs Abs: 1.4 10*3/uL (ref 0.7–4.0)
MCH: 31.4 pg (ref 26.0–34.0)
MCHC: 34.7 g/dL (ref 30.0–36.0)
MCV: 90.3 fL (ref 78.0–100.0)
MONO ABS: 0.5 10*3/uL (ref 0.1–1.0)
MONOS PCT: 9 %
NEUTROS PCT: 66 %
Neutro Abs: 3.8 10*3/uL (ref 1.7–7.7)
Platelets: 114 10*3/uL — ABNORMAL LOW (ref 150–400)
RBC: 4.75 MIL/uL (ref 4.22–5.81)
RDW: 14.4 % (ref 11.5–15.5)
WBC: 5.7 10*3/uL (ref 4.0–10.5)

## 2016-12-07 LAB — TROPONIN I

## 2016-12-07 MED ORDER — ACETAMINOPHEN 325 MG PO TABS
650.0000 mg | ORAL_TABLET | Freq: Once | ORAL | Status: AC
  Administered 2016-12-07: 650 mg via ORAL
  Filled 2016-12-07: qty 2

## 2016-12-07 MED ORDER — AZITHROMYCIN 250 MG PO TABS: ORAL_TABLET | ORAL | 0 refills | Status: DC

## 2016-12-07 NOTE — ED Provider Notes (Signed)
[facility]-EMERGENCY DEPT [facility] Provider Note   CSN: [id number] Arrival date & time: 12/07/16  1419  By signing my name below, I, William Baxter, attest that this documentation has been prepared under the direction and in the presence of Charlynne Panderavid Hsienta Naeemah Jasmer, MD  Electronically Signed: Clovis PuAvnee Baxter, ED Scribe. 12/07/16. 4:11 PM.  History   Chief Complaint Chief Complaint  Patient presents with  . Cough   The history is provided by the patient. No language interpreter was used.   HPI Comments:  William Baxter is a 56 y.o. male, with a hx of MI and PSHx of cardiac catheter[facility] ago [facility] (nonobstructive disease per patient), who presents to the Emergency Department complaining of persistent cough onset 3 days. Pt also reports SOB, diaphoresis, fatigue, subjective fevers and chest pain. He states he is in school and has been in and around a hospital visiting his mother who recently had pneumonia. No alleviating factors noted. Pt denies any other associated symptoms.   Past Medical History:  Diagnosis Date  . Heart disease   . Medical history non-contributory   . Myocardial infarction acute     Patient Active Problem List   Diagnosis Date Noted  . Chest pain with moderate risk for cardiac etiology 06/14/2016  . Chest pain, moderate coronary artery risk 06/14/2016  . Headache   . Acute coronary syndrome (HCC) 12/16/2013    History reviewed. No pertinent surgical history.     Home Medications    Prior to Admission medications   Medication Sig Start Date End Date Taking? Authorizing Provider  acetaminophen (TYLENOL) 325 MG tablet Take 325 mg by mouth every 6 (six) hours as needed for mild pain.     Historical Provider, MD  albuterol (PROVENTIL HFA;VENTOLIN HFA) 108 (90 Base) MCG/ACT inhaler Inhale 2 puffs into the lungs every 6 (six) hours as needed for wheezing or shortness of breath. 06/15/16   Joseph ArtJessica U Vann, DO  aspirin EC 81 MG EC tablet Take 1 tablet (81 mg total) by  mouth daily. 12/19/13   Orpah CobbAjay Kadakia, MD  atorvastatin (LIPITOR) 80 MG tablet Take 1 tablet (80 mg total) by mouth daily at 6 PM. 12/19/13   Orpah CobbAjay Kadakia, MD  clopidogrel (PLAVIX) 75 MG tablet Take 1 tablet (75 mg total) by mouth daily with breakfast. 12/19/13   Orpah CobbAjay Kadakia, MD  isosorbide mononitrate (IMDUR) 60 MG 24 hr tablet Take 60 mg by mouth daily.    Historical Provider, MD  metoprolol (LOPRESSOR) 25 MG tablet Take 1 tablet (25 mg total) by mouth 2 (two) times daily. 06/15/16   Joseph ArtJessica U Vann, DO  nitroGLYCERIN (NITROSTAT) 0.4 MG SL tablet Place 1 tablet (0.4 mg total) under the tongue every 5 (five) minutes as needed for chest pain. 12/19/13   Orpah CobbAjay Kadakia, MD  polyvinyl alcohol (LIQUIFILM TEARS) 1.4 % ophthalmic solution Place 1 drop into both eyes as needed for dry eyes.    Historical Provider, MD  tamsulosin (FLOMAX) 0.4 MG CAPS capsule Take 0.4 mg by mouth daily.    Historical Provider, MD    Family History No family history on file.  Social History Social History  Substance Use Topics  . Smoking status: Current Every Day Smoker    Packs/day: 0.00    Types: Cigarettes  . Smokeless tobacco: Never Used  . Alcohol use Yes     Comment: occ     Allergies   Penicillins   Review of Systems Review of Systems  Constitutional: Positive for diaphoresis,  fatigue and fever.  Respiratory: Positive for cough and shortness of breath.   Cardiovascular: Positive for chest pain. Negative for leg swelling.  All other systems reviewed and are negative.    Physical Exam Updated Vital Signs BP 128/81 (BP Location: Right Arm)   Pulse 108   Temp 101 F (38.3 C) (Oral)   Resp 18   Ht 5\' 11"  (1.803 m)   Wt 170 lb (77.1 kg)   SpO2 97%   BMI 23.71 kg/m   Physical Exam  Constitutional: He is oriented to person, place, and time. He appears well-developed and well-nourished.  HENT:  Head: Normocephalic and atraumatic.  Eyes: EOM are normal.  Neck: Normal range of motion.    Cardiovascular: Normal rate, regular rhythm, normal heart sounds and intact distal pulses.   Pulmonary/Chest: Effort normal. No respiratory distress. He has decreased breath sounds. He has no wheezes.  Diminished throughout. No wheezing or crackles.   Abdominal: Soft. He exhibits no distension. There is no tenderness.  Musculoskeletal: Normal range of motion.  Neurological: He is alert and oriented to person, place, and time.  Skin: Skin is warm and dry.  Psychiatric: He has a normal mood and affect. Judgment normal.  Nursing note and vitals reviewed.  ED Treatments / Results  DIAGNOSTIC STUDIES:  Oxygen Saturation is 97% on RA, normal by my interpretation.    COORDINATION OF CARE:  4:11 PM Discussed treatment plan with pt at bedside and pt agreed to plan.  Labs (all labs ordered are listed, but only abnormal results are displayed) Labs Reviewed - No data to display  EKG  EKG Interpretation None       Radiology No results found.  Procedures Procedures (including critical care time)  Medications Ordered in ED Medications  acetaminophen (TYLENOL) tablet 650 mg (650 mg Oral Given 12/07/16 1446)     Initial Impression / Assessment and Plan / ED Course  I have reviewed the triage vital signs and the nursing notes.  Pertinent labs & imaging results that were available during my care of the patient were reviewed by me and considered in my medical decision making (see chart for details).    William Baxter is a 56 y.o. male here with cough, fever, chest pain. Febrile 101 F in the ED. Fever and chills for 3 days and was around mother who recently was hospitalized for pneumonia. Has chest pain when he coughs. Likely flu vs pneumonia. Has hx of MI but no stents and had nl cath 2 weeks ago at [facility]. Low suspicion for ACS.  6:50 PM Fever improved. WBC nl. CXR clear. I think likely bronchitis vs flu syndrome. Outside window for tamiflu. Recommend tylenol, motrin. Will give zpack  for bronchitis.    Final Clinical Impressions(s) / ED Diagnoses   Final diagnoses:  None    New Prescriptions New Prescriptions   No medications on file  I personally performed the services described in this documentation, which was scribed in my presence. The recorded information has been reviewed and is accurate.    Charlynne Pander, MD 12/07/16 [phone]

## 2016-12-07 NOTE — ED Triage Notes (Signed)
C/o flu s/s x4 days-NAD-steady gait

## 2016-12-07 NOTE — Discharge Instructions (Signed)
Take zpack as prescribed.  Continue tylenol, motrin for fevers.   Rest for 2 days.  Continue robitussin, delsym for cough   See your doctor  Return to ER if you have trouble breathing, fever for a week, dehydration.

## 2016-12-08 LAB — I-STAT CG4 LACTIC ACID, ED: LACTIC ACID, VENOUS: 0.85 mmol/L (ref 0.5–1.9)

## 2016-12-30 ENCOUNTER — Emergency Department (HOSPITAL_BASED_OUTPATIENT_CLINIC_OR_DEPARTMENT_OTHER): Payer: Non-veteran care

## 2016-12-30 ENCOUNTER — Emergency Department (HOSPITAL_BASED_OUTPATIENT_CLINIC_OR_DEPARTMENT_OTHER)
Admission: EM | Admit: 2016-12-30 | Discharge: 2016-12-30 | Disposition: A | Discharge status: Home / Self Care | Payer: Non-veteran care | Attending: Physician Assistant | Admitting: Physician Assistant

## 2016-12-30 ENCOUNTER — Encounter (HOSPITAL_BASED_OUTPATIENT_CLINIC_OR_DEPARTMENT_OTHER): Payer: Self-pay | Admitting: *Deleted

## 2016-12-30 DIAGNOSIS — Z79899 Other long term (current) drug therapy: Secondary | ICD-10-CM | POA: Diagnosis not present

## 2016-12-30 DIAGNOSIS — Y92009 Unspecified place in unspecified non-institutional (private) residence as the place of occurrence of the external cause: Secondary | ICD-10-CM | POA: Diagnosis not present

## 2016-12-30 DIAGNOSIS — Y939 Activity, unspecified: Secondary | ICD-10-CM | POA: Diagnosis not present

## 2016-12-30 DIAGNOSIS — Z7982 Long term (current) use of aspirin: Secondary | ICD-10-CM | POA: Diagnosis not present

## 2016-12-30 DIAGNOSIS — R55 Syncope and collapse: Secondary | ICD-10-CM | POA: Insufficient documentation

## 2016-12-30 DIAGNOSIS — F10239 Alcohol dependence with withdrawal, unspecified: Secondary | ICD-10-CM | POA: Insufficient documentation

## 2016-12-30 DIAGNOSIS — F17210 Nicotine dependence, cigarettes, uncomplicated: Secondary | ICD-10-CM | POA: Insufficient documentation

## 2016-12-30 DIAGNOSIS — W1809XA Striking against other object with subsequent fall, initial encounter: Secondary | ICD-10-CM | POA: Diagnosis not present

## 2016-12-30 DIAGNOSIS — F10930 Alcohol use, unspecified with withdrawal, uncomplicated: Secondary | ICD-10-CM

## 2016-12-30 DIAGNOSIS — Y999 Unspecified external cause status: Secondary | ICD-10-CM | POA: Insufficient documentation

## 2016-12-30 DIAGNOSIS — F10230 Alcohol dependence with withdrawal, uncomplicated: Secondary | ICD-10-CM

## 2016-12-30 HISTORY — DX: Migraine, unspecified, not intractable, without status migrainosus: G43.909

## 2016-12-30 HISTORY — DX: Benign prostatic hyperplasia without lower urinary tract symptoms: N40.0

## 2016-12-30 LAB — COMPREHENSIVE METABOLIC PANEL
ALBUMIN: 3.7 g/dL (ref 3.5–5.0)
ALT: 28 U/L (ref 17–63)
AST: 43 U/L — ABNORMAL HIGH (ref 15–41)
Alkaline Phosphatase: 59 U/L (ref 38–126)
Anion gap: 7 (ref 5–15)
BILIRUBIN TOTAL: 0.7 mg/dL (ref 0.3–1.2)
BUN: 7 mg/dL (ref 6–20)
CO2: 28 mmol/L (ref 22–32)
Calcium: 9.2 mg/dL (ref 8.9–10.3)
Chloride: 103 mmol/L (ref 101–111)
Creatinine, Ser: 0.82 mg/dL (ref 0.61–1.24)
GFR calc Af Amer: >60 mL/min (ref >60)
GFR calc non Af Amer: >60 mL/min (ref >60)
GLUCOSE: 91 mg/dL (ref 65–99)
Potassium: 3 mmol/L — ABNORMAL LOW (ref 3.5–5.1)
Sodium: 138 mmol/L (ref 135–145)
TOTAL PROTEIN: 8 g/dL (ref 6.5–8.1)

## 2016-12-30 LAB — TROPONIN I: Troponin I: <0.03 ng/mL (ref <0.03)

## 2016-12-30 LAB — PROTIME-INR
INR: 0.95
PROTHROMBIN TIME: 12.7 s (ref 11.4–15.2)

## 2016-12-30 LAB — CBC WITH DIFFERENTIAL/PLATELET
BASOS ABS: 0 10*3/uL (ref 0.0–0.1)
Basophils Relative: 0 %
Eosinophils Absolute: 0.1 10*3/uL (ref 0.0–0.7)
Eosinophils Relative: 2 %
HCT: 37.2 % — ABNORMAL LOW (ref 39.0–52.0)
Hemoglobin: 13.3 g/dL (ref 13.0–17.0)
LYMPHS ABS: 1.7 10*3/uL (ref 0.7–4.0)
Lymphocytes Relative: 38 %
MCH: 31.7 pg (ref 26.0–34.0)
MCHC: 35.8 g/dL (ref 30.0–36.0)
MCV: 88.8 fL (ref 78.0–100.0)
MONO ABS: 0.4 10*3/uL (ref 0.1–1.0)
Monocytes Relative: 9 %
NEUTROS ABS: 2.3 10*3/uL (ref 1.7–7.7)
Neutrophils Relative %: 51 %
PLATELETS: 80 10*3/uL — AB (ref 150–400)
RBC: 4.19 MIL/uL — ABNORMAL LOW (ref 4.22–5.81)
RDW: 14.6 % (ref 11.5–15.5)
WBC: 4.5 10*3/uL (ref 4.0–10.5)

## 2016-12-30 LAB — ETHANOL: Alcohol, Ethyl (B): 43 mg/dL — ABNORMAL HIGH (ref <5)

## 2016-12-30 LAB — LIPASE, BLOOD: LIPASE: 29 U/L (ref 11–51)

## 2016-12-30 MED ORDER — SODIUM CHLORIDE 0.9 % IV BOLUS (SEPSIS)
1000.0000 mL | Freq: Once | INTRAVENOUS | Status: AC
  Administered 2016-12-30: 1000 mL via INTRAVENOUS

## 2016-12-30 MED ORDER — LORAZEPAM 2 MG/ML IJ SOLN
1.0000 mg | Freq: Once | INTRAMUSCULAR | Status: AC
  Administered 2016-12-30: 1 mg via INTRAVENOUS
  Filled 2016-12-30: qty 1

## 2016-12-30 NOTE — Discharge Instructions (Signed)
Please return if having any symptoms of alcohol withdrawal or you would like treatment for withdrawal. We're unsure what caused your fall earlier but your labs are reassuring except for very low platelets. Please return to follow-up with your primary care physician- you need to call tomorrow for an appointment to address the low platelets.  To find a primary care or specialty doctor please call [phone] or [phone] to access "[facility] Find a Doctor Service."  You may also go on the [facility] website at [email or web address]  There are also multiple [facility] [place] [facility] practices throughout [place] that are frequently accepting new patients. You may find a clinic that is close to your home and contact them.  [facility] -  [address] [place] [address] [phone]  [facility] in [place] (also locations in [place] and [place]) -  [address] [place] [address] [phone]  [facility] -  [address] [place] [address] [phone]  Substance Abuse Treatment Programs  Intensive Outpatient Programs [facility]     [address]      [place], [place]                   [phone]       [facility] [address] [place] [address] [place], [place] [phone]  [facility]     (Inpatient and outpatient)     [address]           [phone]    [facility] [phone] (Suboxone and Methadone)  [address]      [place], [place] [address]      [phone]       [address] [place], [place] [phone]  [facility] (Outpatient/Inpatient, Chemical)    (insurance only) [phone]             [facility] (Groups & Residential) [place], [place] [phone]     [facility]     [address]     [place],  [place]      [phone]       [facility] (for caregivers and family) [phone] [address] [place], [place] [phone]      Residential Treatment Programs [facility]      [address], [place], [place] [address]  [phone]       [facility] [address] [place], [place] [address] [phone]  [facility] [place]        [phone]       [facility] [phone]  [facility] ([facility]             [address]                                         [place], [place]                                                [phone] or [phone]  [facility] [address] [place], [address] [phone]  [facility]    [address]      [place], [place]     [phone]       [facility] [address] [place], [place] [phone]  [facility]   [address] [place], [place] [address]     [phone]      Admissions: 8am-3pm M-F  [facility] Services (RTS) [address] [place], [place] [phone]  [facility] Program: Residential Program [phone] Days)   [place], [place]      [phone] or [phone]     [facility]: [facility] [place], [place] (Walk in Hours over the weekend or by referral)  [facility] [address] [place], [place], [place] [address] [phone]  Crisis Mobile: [facility]:  [phone] (for crisis response 24 hours a day) [facility] Hotline:      [phone] Outpatient Psychiatry and Counseling  [facility]: Mobile Crisis Management 24 hours:  [phone]  [facility] [employer] sliding scale fee and walk in schedule: M-F 8am-12pm/1pm-3pm [address]  [place], [place] [address] [phone]  [facility] [address] [place], [place] [address] [phone]  [facility] (Formerly known  as [facility] [employer])- new patient walk-in appointments available Monday - Friday 8am -3pm.          [address] [place], [place] [address] [phone] or crisis line- [phone]  [facility] Outpatient Services/ Intensive Outpatient Therapy Program [address] [place], [place] [address] [phone]  [facility]                  Crisis Services      [phone] [address]     [place], [place] [address]                 [facility]   [facility] [phone]. [address] [place], [place] [address]   [employer] of Care          [address] [place]  [place], [place] [address]       [phone]  [facility] [address] [place], [place] [address] [phone]  [facility]    [address]    [place], [place] [address]     [phone]       Andee Poles, MD     [address]     [place] [address]     [phone]       [facility] [address] [place] [address]  [facility]     [address] [place], [place]      [phone]       [facility] Eulogio Ditch, MD [address] [place], [place] [address] [phone]  [facility]     [address]     [place], [place] [address]     [phone]       [facility] [address] [place], [place] [address] [phone] Resources for Temporary Residential Assistance/Crisis Centers  DAY CENTERS [facility]) M-F 8am-3pm   [address] [place], [place] [address]   [phone] Services include: laundry, barbering, support groups, case management, phone  &  computer access, showers, AA/NA mtgs, mental health/substance abuse nurse, job skills class, disability information, VA assistance, spiritual classes, etc.    HOMELESS SHELTERS  [facility]     [facility]   [address], [place]     [phone]              [facility] (women and children)       [address] [place], [place] [phone] [phone] [email or web address] for application and process Application Required  [facility]   [address]    [place] [address]     [phone]                    [facility] [address] [place], [place] [address] [phone](schedule application appt.) Application Required  [facility] (women only)    [address]     [place], [place] [address]     [phone]      Intake starts 6pm daily Need valid ID, SSC, & Police report [facility] [place] [address] [place], [place] [phone] Application Required  [facility] (men only)     [address]      [place], [place]     [phone]       [facility] (Pregnant women only) [address] [place], [place] [phone]  [facility]      [address]      [place], [place] [address]     [phone]             [facility] [address] [place], [place] [phone] 90 day commitment/SA/Application process  [facility](men only)     [address]     [place], [place]     [phone]       Check-in at [facility]            [facility] [address] [place], [place] [address] [phone] Men/Women/Women and Children must be there by 7 pm  [facility] [place], [place] [phone]

## 2016-12-30 NOTE — ED Notes (Signed)
Pt. Drank water with  No difficulty / Pt. Walked around the NSG station with a slight skip in his step.  Reported to EDP.

## 2016-12-30 NOTE — ED Provider Notes (Signed)
[facility] Provider Note   CSN: [id number] Arrival date & time: 12/30/16  1630     History   Chief Complaint Chief Complaint  Patient presents with  . Fall    HPI William JockMichael K Baxter is a 56 y.o. male.  HPI   Patient is a 56 year old male presenting with fall over the night last night. Patient reports that he went out to have it drink at a bar and then when he came home he remembers going to sleep. His wife reports that she came in and went to sleep at 1 AM and then at 2:30 when to go to the bathroom and fell down she had to help him back to the bed herself. The patient did not want to go to the doctor at the time.  He woke up this morning feeling fatigued, shaky, mild headache.  Initially patient said that he had just one beer, but then changed it to one 40 oz.  Past Medical History:  Diagnosis Date  . BPH (benign prostatic hyperplasia)   . Heart disease   . Medical history non-contributory   . Migraine   . Myocardial infarction acute     Patient Active Problem List   Diagnosis Date Noted  . Chest pain with moderate risk for cardiac etiology 06/14/2016  . Chest pain, moderate coronary artery risk 06/14/2016  . Headache   . Acute coronary syndrome (HCC) 12/16/2013    History reviewed. No pertinent surgical history.     Home Medications    Prior to Admission medications   Medication Sig Start Date End Date Taking? Authorizing Provider  acetaminophen (TYLENOL) 325 MG tablet Take 325 mg by mouth every 6 (six) hours as needed for mild pain.     Historical Provider, MD  albuterol (PROVENTIL HFA;VENTOLIN HFA) 108 (90 Base) MCG/ACT inhaler Inhale 2 puffs into the lungs every 6 (six) hours as needed for wheezing or shortness of breath. 06/15/16   Joseph ArtJessica U Vann, DO  aspirin EC 81 MG EC tablet Take 1 tablet (81 mg total) by mouth daily. 12/19/13   Orpah CobbAjay Kadakia, MD  atorvastatin (LIPITOR) 80 MG tablet Take 1 tablet (80 mg total) by mouth daily at 6 PM. 12/19/13    Orpah CobbAjay Kadakia, MD  clopidogrel (PLAVIX) 75 MG tablet Take 1 tablet (75 mg total) by mouth daily with breakfast. 12/19/13   Orpah CobbAjay Kadakia, MD  isosorbide mononitrate (IMDUR) 60 MG 24 hr tablet Take 60 mg by mouth daily.    Historical Provider, MD  metoprolol (LOPRESSOR) 25 MG tablet Take 1 tablet (25 mg total) by mouth 2 (two) times daily. 06/15/16   Joseph ArtJessica U Vann, DO  nitroGLYCERIN (NITROSTAT) 0.4 MG SL tablet Place 1 tablet (0.4 mg total) under the tongue every 5 (five) minutes as needed for chest pain. 12/19/13   Orpah CobbAjay Kadakia, MD  polyvinyl alcohol (LIQUIFILM TEARS) 1.4 % ophthalmic solution Place 1 drop into both eyes as needed for dry eyes.    Historical Provider, MD  tamsulosin (FLOMAX) 0.4 MG CAPS capsule Take 0.4 mg by mouth daily.    Historical Provider, MD    Family History History reviewed. No pertinent family history.  Social History Social History  Substance Use Topics  . Smoking status: Current Every Day Smoker    Packs/day: 0.00    Types: Cigarettes  . Smokeless tobacco: Never Used  . Alcohol use Yes     Comment: occ     Allergies   Penicillins   Review of Systems Review  of Systems  Constitutional: Positive for fatigue. Negative for activity change.  Respiratory: Negative for shortness of breath.   Cardiovascular: Negative for chest pain.  Gastrointestinal: Negative for abdominal pain.  Neurological: Positive for syncope. Negative for weakness.  All other systems reviewed and are negative.    Physical Exam Updated Vital Signs BP 120/85   Pulse 84   Temp 98.1 F (36.7 C) (Oral)   Resp 23   Ht 5\' 11"  (1.803 m)   Wt 175 lb (79.4 kg)   SpO2 97%   BMI 24.41 kg/m   Physical Exam  Constitutional: He is oriented to person, place, and time. He appears well-nourished.  Tremulous, tongue fasiculations.  HENT:  Head: Normocephalic.  Eyes: Conjunctivae are normal.  Cardiovascular: Normal rate and regular rhythm.   Pulmonary/Chest: Effort normal and breath  sounds normal. No respiratory distress.  Abdominal: Soft. Bowel sounds are normal. There is no tenderness.  Neurological: He is alert and oriented to person, place, and time. No cranial nerve deficit. Coordination normal.  tremulous  Skin: Skin is warm and dry. He is not diaphoretic.  Psychiatric: He has a normal mood and affect. His behavior is normal.     ED Treatments / Results  Labs (all labs ordered are listed, but only abnormal results are displayed) Labs Reviewed  COMPREHENSIVE METABOLIC PANEL - Abnormal; Notable for the following:       Result Value   Potassium 3.0 (*)    AST 43 (*)    All other components within normal limits  CBC WITH DIFFERENTIAL/PLATELET - Abnormal; Notable for the following:    RBC 4.19 (*)    HCT 37.2 (*)    Platelets 80 (*)    All other components within normal limits  ETHANOL - Abnormal; Notable for the following:    Alcohol, Ethyl (B) 43 (*)    All other components within normal limits  LIPASE, BLOOD  PROTIME-INR  TROPONIN I    EKG  EKG Interpretation  Date/Time:  Wednesday December 30 2016 17:00:09 EST Ventricular Rate:  81 PR Interval:    QRS Duration: 84 QT Interval:  364 QTC Calculation: 423 R Axis:   92 Text Interpretation:  Sinus rhythm Consider right atrial enlargement Borderline right axis deviation Minimal ST elevation, inferior leads No significant change since last tracing Confirmed by Kandis Mannan ([id number]) on 12/30/2016 5:22:17 PM       Radiology Ct Head Wo Contrast  Result Date: 12/30/2016 CLINICAL DATA:  Larey Seat and hit head on bathtub this morning. Headache. EXAM: CT HEAD WITHOUT CONTRAST TECHNIQUE: Contiguous axial images were obtained from the base of the skull through the vertex without intravenous contrast. COMPARISON:  05/08/2015 FINDINGS: Brain: Mild stable age advanced cerebral atrophy. The ventricles are in the midline without mass effect or shift. They are normal in size and configuration. No extra-axial fluid  collections are identified. Stable basal ganglia calcifications. The brainstem and cerebellum appear normal. Vascular: No hyperdense vessels or definite aneurysm. Stable vascular calcifications. Skull: No skull fracture or bone lesion. Sinuses/Orbits: Left half sphenoid sinus disease with marked mucoperiosteal thickening. There is also scattered ethmoid sinus disease. The frontal sinuses are clear. The maxillary sinuses are not included. The right mastoid air cells demonstrate chronic effusions. The globes are intact. Other: Left frontal scalp hematoma but no underlying fracture. IMPRESSION: 1. Stable mild age advanced cerebral atrophy. 2. No acute intracranial findings or skull fracture. Electronically Signed   By: Rudie Meyer M.D.   On: 12/30/2016 17:39  Procedures Procedures (including critical care time)  Medications Ordered in ED Medications  sodium chloride 0.9 % bolus 1,000 mL (0 mLs Intravenous Stopped 12/30/16 1941)  LORazepam (ATIVAN) injection 1 mg (1 mg Intravenous Given 12/30/16 1750)     Initial Impression / Assessment and Plan / ED Course  I have reviewed the triage vital signs and the nursing notes.  Pertinent labs & imaging results that were available during my care of the patient were reviewed by me and considered in my medical decision making (see chart for details).     Patient is a 56 year old male presenting with fall last night. Initial story that was that patient went to the bar and then came home after only one beer and wife came home today to find him in bed still feeling and also brought him here. However patient was tremulous on exam with tongue fasciculations. The nurse and I both stated that this usually happens with alcohol withdrawal. Patient initially denied it completely. Then stated that he was drinking heavily before but stopped last week because a funeral his family. IT feels Patient is reticent to be completely honest about drinking habits.  I feeling  that his heavy drinking is likely played a role in his fall. We will workup for trauma.   Reapproached patient because he has a positive alcohol level and he said he didn't drink anything today and only had 1beer last night. Patient has not been honest with me. I have no idea whether he perhaps had a withdrawal seizure last night but since I can't ascertain the truth about sycnope vs alcohol consuption vs withdrwal, there  is nothing much that I can do right now. I will send him home with instructions and precautions about alcohol withdrawal and syncope.     Final Clinical Impressions(s) / ED Diagnoses   Final diagnoses:  Alcohol withdrawal syndrome without complication [facility])    New Prescriptions New Prescriptions   No medications on file     Stehanie Ekstrom Randall An, MD 12/30/16 2040

## 2016-12-30 NOTE — ED Notes (Signed)
Family at bedside. 

## 2016-12-30 NOTE — ED Triage Notes (Signed)
pts wife reports that pt fell at home at 2:45am.  Reports that he hit his head on the bath tub.  Denies incontinence, denies combative, denies confusion.  States that he fell 3 more times before wife was able to get back to bed.   Reports that when pts wife got home from work at [employer]3pm, pt was still in bed.  Pt A/O x 4, ambulatory into the dept today with no difficulty.  Noted to have a large 'knot' on the top of his head.

## 2016-12-30 NOTE — ED Notes (Signed)
Pt. Reports he was in a bar last night and had a drink and then remembers driving home.  Pt. Then recalls having ice on his head.  Pt. Now reports just feeling weak.   Pt. Is shaky with tremors in his hands and arms

## 2017-03-10 ENCOUNTER — Encounter (HOSPITAL_COMMUNITY): Payer: Self-pay | Admitting: *Deleted

## 2017-03-10 ENCOUNTER — Emergency Department (HOSPITAL_COMMUNITY)
Admission: EM | Admit: 2017-03-10 | Discharge: 2017-03-11 | Disposition: A | Discharge status: Home / Self Care | Payer: Non-veteran care | Attending: Emergency Medicine | Admitting: Emergency Medicine

## 2017-03-10 DIAGNOSIS — Z202 Contact with and (suspected) exposure to infections with a predominantly sexual mode of transmission: Secondary | ICD-10-CM | POA: Diagnosis not present

## 2017-03-10 DIAGNOSIS — I252 Old myocardial infarction: Secondary | ICD-10-CM | POA: Insufficient documentation

## 2017-03-10 DIAGNOSIS — F17210 Nicotine dependence, cigarettes, uncomplicated: Secondary | ICD-10-CM | POA: Insufficient documentation

## 2017-03-10 DIAGNOSIS — R21 Rash and other nonspecific skin eruption: Secondary | ICD-10-CM | POA: Diagnosis present

## 2017-03-10 DIAGNOSIS — Z79899 Other long term (current) drug therapy: Secondary | ICD-10-CM | POA: Insufficient documentation

## 2017-03-10 DIAGNOSIS — Z7982 Long term (current) use of aspirin: Secondary | ICD-10-CM | POA: Diagnosis not present

## 2017-03-10 NOTE — ED Triage Notes (Signed)
Pt c/o rash to bilateral arms since Saturday. Denies new lotions, detergents, or food. Pt also requesting STD check since intercourse with new partner, denies symptoms

## 2017-03-10 NOTE — ED Provider Notes (Signed)
[facility] Provider Note   CSN: [id number] Arrival date & time: 03/10/17  2130   By signing my name below, I, Teofilo Pod, attest that this documentation has been prepared under the direction and in the presence of [employer], PA-C. Electronically Signed: Teofilo Pod, ED Scribe. 03/10/2017. 11:33 PM.   History   Chief Complaint Chief Complaint  Patient presents with  . Exposure to STD  . Rash   The history is provided by the patient. No language interpreter was used.   HPI Comments:  William Baxter is a 56 y.o. male who presents to the Emergency Department complaining of a worsening rash to bilateral arms x 4 days. He states that the rash is itchy. Denies using any new lotions, detergents, or eating abnormal foods. Pt is also requesting an STD check after having sex with a new male partner 1 week ago. He states that the condom came off during sex. No alleviating factors noted. Denies penile discharge, dysuria.   Past Medical History:  Diagnosis Date  . BPH (benign prostatic hyperplasia)   . Heart disease   . Medical history non-contributory   . Migraine   . Myocardial infarction acute [facility])     Patient Active Problem List   Diagnosis Date Noted  . Chest pain with moderate risk for cardiac etiology 06/14/2016  . Chest pain, moderate coronary artery risk 06/14/2016  . Headache   . Acute coronary syndrome (HCC) 12/16/2013    Past Surgical History:  Procedure Laterality Date  . CARDIAC CATHETERIZATION         Home Medications    Prior to Admission medications   Medication Sig Start Date End Date Taking? Authorizing Provider  acetaminophen (TYLENOL) 325 MG tablet Take 325 mg by mouth every 6 (six) hours as needed for mild pain.     [provider]  albuterol (PROVENTIL HFA;VENTOLIN HFA) 108 (90 Base) MCG/ACT inhaler Inhale 2 puffs into the lungs every 6 (six) hours as needed for wheezing or shortness of breath. 06/15/16   Joseph Art, DO  aspirin EC 81 MG EC tablet Take 1 tablet (81 mg total) by mouth daily. 12/19/13   Orpah Cobb, MD  atorvastatin (LIPITOR) 80 MG tablet Take 1 tablet (80 mg total) by mouth daily at 6 PM. 12/19/13   Orpah Cobb, MD  clopidogrel (PLAVIX) 75 MG tablet Take 1 tablet (75 mg total) by mouth daily with breakfast. 12/19/13   Orpah Cobb, MD  isosorbide mononitrate (IMDUR) 60 MG 24 hr tablet Take 60 mg by mouth daily.    [provider]  metoprolol (LOPRESSOR) 25 MG tablet Take 1 tablet (25 mg total) by mouth 2 (two) times daily. 06/15/16   Joseph Art, DO  nitroGLYCERIN (NITROSTAT) 0.4 MG SL tablet Place 1 tablet (0.4 mg total) under the tongue every 5 (five) minutes as needed for chest pain. 12/19/13   Orpah Cobb, MD  polyvinyl alcohol (LIQUIFILM TEARS) 1.4 % ophthalmic solution Place 1 drop into both eyes as needed for dry eyes.    [provider]  tamsulosin (FLOMAX) 0.4 MG CAPS capsule Take 0.4 mg by mouth daily.    [provider]    Family History No family history on file.  Social History Social History  Substance Use Topics  . Smoking status: Current Every Day Smoker    Packs/day: 0.00    Types: Cigarettes  . Smokeless tobacco: Never Used  . Alcohol use Yes     Comment:  occ     Allergies   Penicillins   Review of Systems Review of Systems All other systems negative except as documented in the HPI. All pertinent positives and negatives as reviewed in the HPI.   Physical Exam Updated Vital Signs BP 108/69 (BP Location: Left Arm)   Pulse 85   Temp 98.7 F (37.1 C) (Oral)   Resp (!) 22   SpO2 98%   Physical Exam  Constitutional: He appears well-developed and well-nourished. No distress.  HENT:  Head: Normocephalic and atraumatic.  Eyes: Conjunctivae are normal.  Cardiovascular: Normal rate.   Pulmonary/Chest: Effort normal.  Abdominal: He exhibits no distension.  Neurological: He is alert.  Skin: Skin is warm and dry.    Psychiatric: He has a normal mood and affect.  Nursing note and vitals reviewed.    ED Treatments / Results  DIAGNOSTIC STUDIES:  Oxygen Saturation is 98% on RA, normal by my interpretation.    COORDINATION OF CARE:  11:29 PM Discussed treatment plan with pt at bedside and pt agreed to plan.   Labs (all labs ordered are listed, but only abnormal results are displayed) Labs Reviewed - No data to display  EKG  EKG Interpretation None       Radiology No results found.  Procedures Procedures (including critical care time)  Medications Ordered in ED Medications - No data to display   Initial Impression / Assessment and Plan / ED Course  I have reviewed the triage vital signs and the nursing notes.  Pertinent labs & imaging results that were available during my care of the patient were reviewed by me and considered in my medical decision making (see chart for details).        Patient mainly wants treatment for the possible STD exposure.  We will treat for scabies based on the appearance of the rash and the significant itching.  Patient is advised return here as needed  Final Clinical Impressions(s) / ED Diagnoses   Final diagnoses:  None    New Prescriptions New Prescriptions   No medications on file  I personally performed the services described in this documentation, which was scribed in my presence. The recorded information has been reviewed and is accurate.    Charlestine NightLawyer, Tim Corriher, PA-C 03/12/17 0146    Marily MemosMesner, Jason, MD 03/13/17 [phone]

## 2017-03-11 MED ORDER — CEFTRIAXONE SODIUM 250 MG IJ SOLR
250.0000 mg | Freq: Once | INTRAMUSCULAR | Status: AC
Start: 2017-03-11 — End: 2017-03-11
  Administered 2017-03-11: 250 mg via INTRAMUSCULAR
  Filled 2017-03-11: qty 250

## 2017-03-11 MED ORDER — METRONIDAZOLE 500 MG PO TABS
2000.0000 mg | ORAL_TABLET | Freq: Once | ORAL | Status: AC
  Administered 2017-03-11: 2000 mg via ORAL
  Filled 2017-03-11: qty 4

## 2017-03-11 MED ORDER — AZITHROMYCIN 250 MG PO TABS
1000.0000 mg | ORAL_TABLET | Freq: Once | ORAL | Status: AC
  Administered 2017-03-11: 1000 mg via ORAL
  Filled 2017-03-11: qty 4

## 2017-03-11 MED ORDER — PERMETHRIN 5 % EX CREA: TOPICAL_CREAM | CUTANEOUS | 0 refills | Status: DC

## 2017-03-11 MED ORDER — LIDOCAINE HCL (PF) 1 % IJ SOLN
INTRAMUSCULAR | Status: AC
  Administered 2017-03-11: 0.9 mL
  Filled 2017-03-11: qty 5

## 2017-03-11 NOTE — Discharge Instructions (Signed)
Return here as needed.  Follow-up with your primary care doctor. You can take Benadryl for itching

## 2017-03-16 ENCOUNTER — Emergency Department (HOSPITAL_COMMUNITY): Payer: Non-veteran care

## 2017-03-16 ENCOUNTER — Emergency Department (HOSPITAL_COMMUNITY)
Admission: EM | Admit: 2017-03-16 | Discharge: 2017-03-16 | Disposition: A | Discharge status: Home / Self Care | Payer: Non-veteran care | Attending: Emergency Medicine | Admitting: Emergency Medicine

## 2017-03-16 ENCOUNTER — Encounter (HOSPITAL_COMMUNITY): Payer: Self-pay | Admitting: Nurse Practitioner

## 2017-03-16 DIAGNOSIS — Y929 Unspecified place or not applicable: Secondary | ICD-10-CM | POA: Diagnosis not present

## 2017-03-16 DIAGNOSIS — Y939 Activity, unspecified: Secondary | ICD-10-CM | POA: Insufficient documentation

## 2017-03-16 DIAGNOSIS — F17210 Nicotine dependence, cigarettes, uncomplicated: Secondary | ICD-10-CM | POA: Insufficient documentation

## 2017-03-16 DIAGNOSIS — Z7982 Long term (current) use of aspirin: Secondary | ICD-10-CM | POA: Diagnosis not present

## 2017-03-16 DIAGNOSIS — Y999 Unspecified external cause status: Secondary | ICD-10-CM | POA: Insufficient documentation

## 2017-03-16 DIAGNOSIS — S6991XA Unspecified injury of right wrist, hand and finger(s), initial encounter: Secondary | ICD-10-CM | POA: Diagnosis present

## 2017-03-16 DIAGNOSIS — I252 Old myocardial infarction: Secondary | ICD-10-CM | POA: Diagnosis not present

## 2017-03-16 DIAGNOSIS — M20011 Mallet finger of right finger(s): Secondary | ICD-10-CM

## 2017-03-16 NOTE — Discharge Instructions (Signed)
Wear the splint for 8 weeks. Follow up with Dr. Amanda PeaGramig or return here as needed [facility]
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
## 2017-03-16 NOTE — ED Notes (Signed)
Patient transported to X-ray 
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
## 2018-01-18 ENCOUNTER — Other Ambulatory Visit: Payer: Self-pay

## 2018-01-18 ENCOUNTER — Emergency Department (HOSPITAL_COMMUNITY)
Admission: EM | Admit: 2018-01-18 | Discharge: 2018-01-18 | Discharge status: Against Medical Advice | Payer: Non-veteran care | Attending: Emergency Medicine | Admitting: Emergency Medicine

## 2018-01-18 DIAGNOSIS — Z5321 Procedure and treatment not carried out due to patient leaving prior to being seen by health care provider: Secondary | ICD-10-CM | POA: Diagnosis not present

## 2018-01-18 DIAGNOSIS — R51 Headache: Secondary | ICD-10-CM | POA: Diagnosis present

## 2018-01-18 NOTE — ED Triage Notes (Signed)
Pt c/o severe headache "all over". Described as throbbing. Pt having headaches for past 3 years. Pt just started on a new medication but isn't sure if its helping. Pt has an MRI scheduled Thursday but needs something for the migraine as he states he can't wait until then. This is the same type of headache he has been having. Alert and oriented.

## 2019-09-11 ENCOUNTER — Other Ambulatory Visit: Payer: Self-pay

## 2019-09-11 ENCOUNTER — Emergency Department (HOSPITAL_COMMUNITY)
Admission: EM | Admit: 2019-09-11 | Discharge: 2019-09-11 | Disposition: A | Discharge status: Home / Self Care | Payer: No Typology Code available for payment source | Attending: Emergency Medicine | Admitting: Emergency Medicine

## 2019-09-11 ENCOUNTER — Encounter (HOSPITAL_COMMUNITY): Payer: Self-pay | Admitting: Emergency Medicine

## 2019-09-11 ENCOUNTER — Emergency Department (HOSPITAL_COMMUNITY): Payer: No Typology Code available for payment source

## 2019-09-11 DIAGNOSIS — K6289 Other specified diseases of anus and rectum: Secondary | ICD-10-CM | POA: Diagnosis present

## 2019-09-11 DIAGNOSIS — F17210 Nicotine dependence, cigarettes, uncomplicated: Secondary | ICD-10-CM | POA: Diagnosis not present

## 2019-09-11 DIAGNOSIS — Z7902 Long term (current) use of antithrombotics/antiplatelets: Secondary | ICD-10-CM | POA: Insufficient documentation

## 2019-09-11 DIAGNOSIS — I252 Old myocardial infarction: Secondary | ICD-10-CM | POA: Insufficient documentation

## 2019-09-11 DIAGNOSIS — Z7982 Long term (current) use of aspirin: Secondary | ICD-10-CM | POA: Insufficient documentation

## 2019-09-11 DIAGNOSIS — K611 Rectal abscess: Secondary | ICD-10-CM | POA: Diagnosis not present

## 2019-09-11 DIAGNOSIS — Z79899 Other long term (current) drug therapy: Secondary | ICD-10-CM | POA: Insufficient documentation

## 2019-09-11 LAB — URINALYSIS, ROUTINE W REFLEX MICROSCOPIC
Bilirubin Urine: NEGATIVE
Glucose, UA: NEGATIVE mg/dL
Hgb urine dipstick: NEGATIVE
Ketones, ur: 5 mg/dL — AB
Nitrite: NEGATIVE
Protein, ur: 100 mg/dL — AB
Specific Gravity, Urine: 1.023 (ref 1.005–1.030)
pH: 6 (ref 5.0–8.0)

## 2019-09-11 LAB — CBC WITH DIFFERENTIAL/PLATELET
Abs Immature Granulocytes: 0.04 10*3/uL (ref 0.00–0.07)
Basophils Absolute: 0 10*3/uL (ref 0.0–0.1)
Basophils Relative: 0 %
Eosinophils Absolute: 0.1 10*3/uL (ref 0.0–0.5)
Eosinophils Relative: 1 %
HCT: 43.1 % (ref 39.0–52.0)
Hemoglobin: 14.2 g/dL (ref 13.0–17.0)
Immature Granulocytes: 0 %
Lymphocytes Relative: 21 %
Lymphs Abs: 2.1 10*3/uL (ref 0.7–4.0)
MCH: 32.2 pg (ref 26.0–34.0)
MCHC: 32.9 g/dL (ref 30.0–36.0)
MCV: 97.7 fL (ref 80.0–100.0)
Monocytes Absolute: 0.9 10*3/uL (ref 0.1–1.0)
Monocytes Relative: 10 %
Neutro Abs: 6.6 10*3/uL (ref 1.7–7.7)
Neutrophils Relative %: 68 %
Platelets: 149 10*3/uL — ABNORMAL LOW (ref 150–400)
RBC: 4.41 MIL/uL (ref 4.22–5.81)
RDW: 15.2 % (ref 11.5–15.5)
WBC: 9.8 10*3/uL (ref 4.0–10.5)
nRBC: 0 % (ref 0.0–0.2)

## 2019-09-11 LAB — BASIC METABOLIC PANEL
Anion gap: 10 (ref 5–15)
BUN: 5 mg/dL — ABNORMAL LOW (ref 6–20)
CO2: 27 mmol/L (ref 22–32)
Calcium: 9.5 mg/dL (ref 8.9–10.3)
Chloride: 99 mmol/L (ref 98–111)
Creatinine, Ser: 0.72 mg/dL (ref 0.61–1.24)
GFR calc Af Amer: >60 mL/min (ref >60)
GFR calc non Af Amer: >60 mL/min (ref >60)
Glucose, Bld: 137 mg/dL — ABNORMAL HIGH (ref 70–99)
Potassium: 3.9 mmol/L (ref 3.5–5.1)
Sodium: 136 mmol/L (ref 135–145)

## 2019-09-11 MED ORDER — CIPROFLOXACIN HCL 500 MG PO TABS: 500.0000 mg | ORAL_TABLET | Freq: Two times a day (BID) | ORAL | 0 refills | Status: DC

## 2019-09-11 MED ORDER — SODIUM CHLORIDE (PF) 0.9 % IJ SOLN
INTRAMUSCULAR | Status: AC
  Filled 2019-09-11: qty 50

## 2019-09-11 MED ORDER — ACETAMINOPHEN 325 MG PO TABS: 650.0000 mg | ORAL_TABLET | Freq: Once | ORAL | Status: DC

## 2019-09-11 MED ORDER — METRONIDAZOLE 500 MG PO TABS: 500.0000 mg | ORAL_TABLET | Freq: Two times a day (BID) | ORAL | 0 refills | Status: DC

## 2019-09-11 MED ORDER — OXYCODONE-ACETAMINOPHEN 5-325 MG PO TABS: 1.0000 | ORAL_TABLET | Freq: Four times a day (QID) | ORAL | 0 refills | Status: DC | PRN

## 2019-09-11 MED ORDER — IOHEXOL 300 MG/ML  SOLN
100.0000 mL | Freq: Once | INTRAMUSCULAR | Status: AC | PRN
  Administered 2019-09-11: 100 mL via INTRAVENOUS

## 2019-09-11 MED ORDER — KETOROLAC TROMETHAMINE 30 MG/ML IJ SOLN
30.0000 mg | Freq: Once | INTRAMUSCULAR | Status: AC
  Administered 2019-09-11: 30 mg via INTRAVENOUS
  Filled 2019-09-11: qty 1

## 2019-09-11 NOTE — Discharge Instructions (Signed)
Call [facility] tomorrow morning to schedule an appointment Take antibiotics as prescribed  I have prescribed a short course of pain medication to take as needed

## 2019-09-11 NOTE — ED Triage Notes (Signed)
Patient here from home with complaints of abscess near anus x2 days. Difficulty sitting.

## 2019-09-11 NOTE — ED Provider Notes (Signed)
[facility]-EMERGENCY DEPT Provider Note   CSN: [id number] Arrival date & time: 09/11/19  1415     History   Chief Complaint Chief Complaint  Patient presents with  . Abscess  . Rectal Pain    HPI William Baxter is a 58 y.o. male who presents to the ED today complaining of gradual onset, constant, worsening, rectal pain/abscess x 2 days. Pt reports he has been taking Ibuprofen for the pain without relief. He reports difficulty going to the restroom due to pain but has been taking Miralax with some relief. The pain is exacerbated with sitting down and walking. No drainage noted. He does have a hx of hemorrhoids although states this pain feels different. Pt reports he had chills last night but has not taken his temperature. No radiating pain into testicles or penis. Pt is sexually active with 1 male partner. Denies hx of HIV; states he was tested for STDs a couple of months ago and he had "a bacterial infection from my girl" but cannot recall the name. No other complaints at this time.        Past Medical History:  Diagnosis Date  . BPH (benign prostatic hyperplasia)   . Heart disease   . Medical history non-contributory   . Migraine   . Myocardial infarction acute [facility](HCC)     Patient Active Problem List   Diagnosis Date Noted  . Chest pain with moderate risk for cardiac etiology 06/14/2016  . Chest pain, moderate coronary artery risk 06/14/2016  . Headache   . Acute coronary syndrome (HCC) 12/16/2013    Past Surgical History:  Procedure Laterality Date  . CARDIAC CATHETERIZATION          Home Medications    Prior to Admission medications   Medication Sig Start Date End Date Taking? Authorizing Provider  acetaminophen (TYLENOL) 325 MG tablet Take 325 mg by mouth every 6 (six) hours as needed for mild pain.     [provider]  albuterol (PROVENTIL HFA;VENTOLIN HFA) 108 (90 Base) MCG/ACT inhaler Inhale 2 puffs into the lungs every 6  (six) hours as needed for wheezing or shortness of breath. 06/15/16   Joseph ArtVann, Jessica U, DO  aspirin EC 81 MG EC tablet Take 1 tablet (81 mg total) by mouth daily. 12/19/13   Orpah CobbKadakia, Ajay, MD  atorvastatin (LIPITOR) 80 MG tablet Take 1 tablet (80 mg total) by mouth daily at 6 PM. 12/19/13   Orpah CobbKadakia, Ajay, MD  ciprofloxacin (CIPRO) 500 MG tablet Take 1 tablet (500 mg total) by mouth 2 (two) times daily. 09/11/19   Tanda RockersVenter, Shannon Kirkendall, PA-C  clopidogrel (PLAVIX) 75 MG tablet Take 1 tablet (75 mg total) by mouth daily with breakfast. 12/19/13   Orpah CobbKadakia, Ajay, MD  isosorbide mononitrate (IMDUR) 60 MG 24 hr tablet Take 60 mg by mouth daily.    [provider]  metoprolol (LOPRESSOR) 25 MG tablet Take 1 tablet (25 mg total) by mouth 2 (two) times daily. 06/15/16   Joseph ArtVann, Jessica U, DO  metroNIDAZOLE (FLAGYL) 500 MG tablet Take 1 tablet (500 mg total) by mouth 2 (two) times daily. 09/11/19   Hyman HopesVenter, Garnett Nunziata, PA-C  nitroGLYCERIN (NITROSTAT) 0.4 MG SL tablet Place 1 tablet (0.4 mg total) under the tongue every 5 (five) minutes as needed for chest pain. 12/19/13   Orpah CobbKadakia, Ajay, MD  oxyCODONE-acetaminophen (PERCOCET/ROXICET) 5-325 MG tablet Take 1 tablet by mouth every 6 (six) hours as needed for severe pain. 09/11/19   Tanda RockersVenter, Dayden Viverette, PA-C  permethrin (  ELIMITE) 5 % cream Apply Head to toe and wash off after 8 hours 03/11/17   Lawyer, Cristal Deer, PA-C  polyvinyl alcohol (LIQUIFILM TEARS) 1.4 % ophthalmic solution Place 1 drop into both eyes as needed for dry eyes.    [provider]  tamsulosin (FLOMAX) 0.4 MG CAPS capsule Take 0.4 mg by mouth daily.    [provider]    Family History No family history on file.  Social History Social History   Tobacco Use  . Smoking status: Current Every Day Smoker    Packs/day: 0.00    Types: Cigarettes  . Smokeless tobacco: Never Used  Substance Use Topics  . Alcohol use: Yes    Comment: occ  . Drug use: No     Allergies   Penicillins    Review of Systems Review of Systems  Constitutional: Negative for chills and fever.  HENT: Negative for congestion.   Eyes: Negative for visual disturbance.  Respiratory: Negative for cough and shortness of breath.   Gastrointestinal: Positive for constipation and rectal pain. Negative for abdominal pain, nausea and vomiting.       + abscess near rectum  Genitourinary: Negative for discharge, penile pain, penile swelling, scrotal swelling and testicular pain.  Musculoskeletal: Negative for myalgias.  Skin: Negative for rash.  Neurological: Negative for headaches.     Physical Exam Updated Vital Signs BP 123/68 (BP Location: Right Arm)   Pulse 86   Temp 99.1 F (37.3 C) (Oral)   Resp 18   SpO2 100%   Physical Exam Vitals signs and nursing note reviewed.  Constitutional:      Appearance: He is not ill-appearing.  HENT:     Head: Normocephalic and atraumatic.  Eyes:     Conjunctiva/sclera: Conjunctivae normal.  Neck:     Musculoskeletal: Neck supple.  Cardiovascular:     Rate and Rhythm: Normal rate and regular rhythm.     Pulses: Normal pulses.  Pulmonary:     Effort: Pulmonary effort is normal.     Breath sounds: Normal breath sounds. No wheezing, rhonchi or rales.  Abdominal:     Palpations: Abdomen is soft.     Tenderness: There is no abdominal tenderness. There is no guarding or rebound.  Genitourinary:    Comments: Chaperone present for exam. 4 x 2 cm abscess noted to right buttock exactly at opening of rectum; good rectal tone; able to palpate extension of abscess during DRE Skin:    General: Skin is warm and dry.  Neurological:     Mental Status: He is alert.      ED Treatments / Results  Labs (all labs ordered are listed, but only abnormal results are displayed) Labs Reviewed  BASIC METABOLIC PANEL - Abnormal; Notable for the following components:      Result Value   Glucose, Bld 137 (*)    BUN 5 (*)    All other components within normal limits   CBC WITH DIFFERENTIAL/PLATELET - Abnormal; Notable for the following components:   Platelets 149 (*)    All other components within normal limits  URINALYSIS, ROUTINE W REFLEX MICROSCOPIC - Abnormal; Notable for the following components:   Color, Urine AMBER (*)    Ketones, ur 5 (*)    Protein, ur 100 (*)    Leukocytes,Ua TRACE (*)    Bacteria, UA FEW (*)    All other components within normal limits    EKG None  Radiology Ct Pelvis W Contrast  Result Date: 09/11/2019 CLINICAL  DATA:  Abscess near the anal rectal region EXAM: CT PELVIS WITH CONTRAST TECHNIQUE: Multidetector CT imaging of the pelvis was performed using the standard protocol following the bolus administration of intravenous contrast. CONTRAST:  158mL OMNIPAQUE IOHEXOL 300 MG/ML  SOLN COMPARISON:  None. FINDINGS: Urinary Tract: The visualized distal ureters and bladder appear unremarkable. Bowel: Adjacent to the anal canal along the right gluteal fold there is a complex loculated fluid collection measuring 3.9 x 1.8 x 4.3 cm. There is diffuse wall thickening seen around the anal canal. The rectum and visualized portions of the bowel in the pelvis are unremarkable. Soft tissue swelling is seen along the right gluteal fold. No other collections are seen. There is heterogeneous skin thickening and soft tissue edema extending to the overlying skin. Vascular/Lymphatic: No enlarged pelvic lymph nodes identified. Scattered aortic atherosclerotic calcifications are seen without aneurysmal dilatation. Reproductive: The prostate is unremarkable. Other: None Musculoskeletal: No acute or significant osseous findings. IMPRESSION: Loculated abscess measuring 3.9 x 1.8 x 4.3 cm adjacent to the right anal canal and the right gluteal fold with surrounding wall thickening of the anal canal. There is edema and heterogeneous soft tissue extending to the overlying skin. Electronically Signed   By: Prudencio Pair M.D.   On: 09/11/2019 21:07    Procedures  Procedures (including critical care time)  Medications Ordered in ED Medications  sodium chloride (PF) 0.9 % injection (has no administration in time range)  iohexol (OMNIPAQUE) 300 MG/ML solution 100 mL (100 mLs Intravenous Contrast Given 09/11/19 2049)  ketorolac (TORADOL) 30 MG/ML injection 30 mg (30 mg Intravenous Given 09/11/19 2137)     Initial Impression / Assessment and Plan / ED Course  I have reviewed the triage vital signs and the nursing notes.  Pertinent labs & imaging results that were available during my care of the patient were reviewed by me and considered in my medical decision making (see chart for details).  Clinical Course as of Sep 11 2151  [facility] Sep 11, 2019  2127 Discussed case with Dr. Ninfa Linden who will look at CT scan and give further reccs   [MV]    Clinical Course User Index [MV] Eustaquio Maize, PA-C   59 year old male presents to the ED today complaining of rectal pain and abscess near his rectum for the past 2 days.  Drainage.  States that he has had chills but denies fever.  Temp in the ED 99.1.  Patient states he last took ibuprofen around 9 this morning.  On recheck 99.3.  Denies history of HIV or diabetes.  Chaperone present during exam, patient does have abscess measuring 4 x 2 cm near the opening of his rectum with extension into the rectum during DRE.  Will obtain CT pelvis at this time to evaluate extension of abscess.  Will obtain screening labs as well.  CBC without leukocytosis. No electrolyte abnormalities.   CT scan with loculated abscess with wall thickening of the anal canal. Will consult general surgery at this time given involvement of anal canal.   Dr. Ninfa Linden recommends pt coming to the office in the next 1-2 days. Recommend antibiotics and pain control. Pt unfortunately allergic to PCNs; will give cipro and flagyl. Short course of narcotic pain medication given as needed. PT is in agreement with plan at this time and stable for discahrge  home.   This note was prepared using Dragon voice recognition software and may include unintentional dictation errors due to the inherent limitations of voice recognition software.  Final Clinical Impressions(s) / ED Diagnoses   Final diagnoses:  Perirectal abscess       Tanda Rockers, PA-C 09/11/19 2153    Wynetta Fines, MD 09/11/19 [phone]

## 2019-09-11 NOTE — ED Notes (Signed)
Patient is requesting to leave. He does not want to stay for further treatment. Spoke with Marguax, PA, who came to speak with patient immediately. Patient states he has to go pick up his children. Marguax, PA and myself tried to inform patient the importance of staying until the general surgeon calls back. Also, during the conversation he requested the IV been taken out once he got the Toradol. Patient thinks he should be able to leave and just come back in the morning and start treatment where he left off at.

## 2020-01-19 ENCOUNTER — Other Ambulatory Visit: Payer: Self-pay

## 2020-01-19 ENCOUNTER — Emergency Department (HOSPITAL_BASED_OUTPATIENT_CLINIC_OR_DEPARTMENT_OTHER)
Admission: EM | Admit: 2020-01-19 | Discharge: 2020-01-19 | Disposition: A | Discharge status: Home / Self Care | Payer: No Typology Code available for payment source | Attending: Emergency Medicine | Admitting: Emergency Medicine

## 2020-01-19 ENCOUNTER — Encounter (HOSPITAL_BASED_OUTPATIENT_CLINIC_OR_DEPARTMENT_OTHER): Payer: Self-pay | Admitting: *Deleted

## 2020-01-19 ENCOUNTER — Emergency Department (HOSPITAL_BASED_OUTPATIENT_CLINIC_OR_DEPARTMENT_OTHER): Payer: No Typology Code available for payment source

## 2020-01-19 DIAGNOSIS — M79602 Pain in left arm: Secondary | ICD-10-CM | POA: Diagnosis present

## 2020-01-19 DIAGNOSIS — Z7902 Long term (current) use of antithrombotics/antiplatelets: Secondary | ICD-10-CM | POA: Diagnosis not present

## 2020-01-19 DIAGNOSIS — Z79899 Other long term (current) drug therapy: Secondary | ICD-10-CM | POA: Insufficient documentation

## 2020-01-19 DIAGNOSIS — F17210 Nicotine dependence, cigarettes, uncomplicated: Secondary | ICD-10-CM | POA: Diagnosis not present

## 2020-01-19 DIAGNOSIS — M5412 Radiculopathy, cervical region: Secondary | ICD-10-CM | POA: Insufficient documentation

## 2020-01-19 DIAGNOSIS — Z7982 Long term (current) use of aspirin: Secondary | ICD-10-CM | POA: Diagnosis not present

## 2020-01-19 DIAGNOSIS — M79603 Pain in arm, unspecified: Secondary | ICD-10-CM

## 2020-01-19 MED ORDER — CYCLOBENZAPRINE HCL 10 MG PO TABS: 10.0000 mg | ORAL_TABLET | Freq: Two times a day (BID) | ORAL | 0 refills | Status: DC | PRN

## 2020-01-19 MED ORDER — PREDNISONE 50 MG PO TABS: 50.0000 mg | ORAL_TABLET | Freq: Every day | ORAL | 0 refills | Status: DC

## 2020-01-19 MED ORDER — ETODOLAC 300 MG PO CAPS: 300.0000 mg | ORAL_CAPSULE | Freq: Three times a day (TID) | ORAL | 0 refills | Status: DC

## 2020-01-19 NOTE — Discharge Instructions (Addendum)
Take the medications as prescribed, follow-up with your doctor for further evaluation if the symptoms persist

## 2020-01-19 NOTE — ED Provider Notes (Signed)
[facility] EMERGENCY DEPARTMENT Provider Note   CSN: [id number] Arrival date & time: 01/19/20  1656     History Chief Complaint  Patient presents with  . Arm Pain    William Baxter is a 59 y.o. male.  HPI   Patient presented emergency room for evaluation of arm pain.  Patient states he has had symptoms for the past month.  Patient states initially the pain was in his elbow but now has moved up into his shoulder and neck.  The pain is sharp.  It increases with certain movements.  Patient states he was evaluated at the [facility] but they told him he would need to make an appointment with his primary care doctor.  Patient has a birthday coming up onto the right shoulder he has some pain discomfort.  He denies any fevers or chills.  Past Medical History:  Diagnosis Date  . BPH (benign prostatic hyperplasia)   . Heart disease   . Medical history non-contributory   . Migraine   . Myocardial infarction acute [facility])     Patient Active Problem List   Diagnosis Date Noted  . Chest pain with moderate risk for cardiac etiology 06/14/2016  . Chest pain, moderate coronary artery risk 06/14/2016  . Headache   . Acute coronary syndrome (HCC) 12/16/2013    Past Surgical History:  Procedure Laterality Date  . CARDIAC CATHETERIZATION         No family history on file.  Social History   Tobacco Use  . Smoking status: Current Every Day Smoker    Packs/day: 0.00    Types: Cigarettes  . Smokeless tobacco: Never Used  Substance Use Topics  . Alcohol use: Yes    Comment: occ  . Drug use: No    Home Medications Prior to Admission medications   Medication Sig Start Date End Date Taking? Authorizing Provider  albuterol (PROVENTIL HFA;VENTOLIN HFA) 108 (90 Base) MCG/ACT inhaler Inhale 2 puffs into the lungs every 6 (six) hours as needed for wheezing or shortness of breath. 06/15/16  Yes Joseph Art, DO  aspirin EC 81 MG EC tablet Take 1 tablet (81 mg total) by mouth daily.  12/19/13  Yes Orpah Cobb, MD  atorvastatin (LIPITOR) 80 MG tablet Take 1 tablet (80 mg total) by mouth daily at 6 PM. 12/19/13  Yes Orpah Cobb, MD  clopidogrel (PLAVIX) 75 MG tablet Take 1 tablet (75 mg total) by mouth daily with breakfast. 12/19/13  Yes Orpah Cobb, MD  isosorbide mononitrate (IMDUR) 60 MG 24 hr tablet Take 60 mg by mouth daily.   Yes [provider]  metoprolol (LOPRESSOR) 25 MG tablet Take 1 tablet (25 mg total) by mouth 2 (two) times daily. 06/15/16  Yes Joseph Art, DO  nitroGLYCERIN (NITROSTAT) 0.4 MG SL tablet Place 1 tablet (0.4 mg total) under the tongue every 5 (five) minutes as needed for chest pain. 12/19/13  Yes Orpah Cobb, MD  tamsulosin (FLOMAX) 0.4 MG CAPS capsule Take 0.4 mg by mouth daily.   Yes [provider]  acetaminophen (TYLENOL) 325 MG tablet Take 325 mg by mouth every 6 (six) hours as needed for mild pain.     [provider]  ciprofloxacin (CIPRO) 500 MG tablet Take 1 tablet (500 mg total) by mouth 2 (two) times daily. 09/11/19   Hyman Hopes, Margaux, PA-C  cyclobenzaprine (FLEXERIL) 10 MG tablet Take 1 tablet (10 mg total) by mouth 2 (two) times daily as needed for muscle spasms. 01/19/20  Dorie Rank, MD  etodolac (LODINE) 300 MG capsule Take 1 capsule (300 mg total) by mouth every 8 (eight) hours. 01/19/20   Dorie Rank, MD  metroNIDAZOLE (FLAGYL) 500 MG tablet Take 1 tablet (500 mg total) by mouth 2 (two) times daily. 09/11/19   Eustaquio Maize, PA-C  oxyCODONE-acetaminophen (PERCOCET/ROXICET) 5-325 MG tablet Take 1 tablet by mouth every 6 (six) hours as needed for severe pain. 09/11/19   Eustaquio Maize, PA-C  permethrin (ELIMITE) 5 % cream Apply Head to toe and wash off after 8 hours 03/11/17   Lawyer, Harrell Gave, PA-C  polyvinyl alcohol (LIQUIFILM TEARS) 1.4 % ophthalmic solution Place 1 drop into both eyes as needed for dry eyes.    [provider]  predniSONE (DELTASONE) 50 MG tablet Take 1 tablet (50 mg total) by  mouth daily. 01/19/20   Dorie Rank, MD    Allergies    Penicillins  Review of Systems   Review of Systems  All other systems reviewed and are negative.   Physical Exam Updated Vital Signs BP 127/80   Pulse 93   Temp 98.2 F (36.8 C) (Oral)   Resp 20   Ht 1.803 m (5\' 11" )   Wt 74.8 kg   SpO2 98%   BMI 23.01 kg/m   Physical Exam Vitals and nursing note reviewed.  Constitutional:      General: He is not in acute distress.    Appearance: He is well-developed.  HENT:     Head: Normocephalic and atraumatic.     Right Ear: External ear normal.     Left Ear: External ear normal.  Eyes:     General: No scleral icterus.       Right eye: No discharge.        Left eye: No discharge.     Conjunctiva/sclera: Conjunctivae normal.  Neck:     Trachea: No tracheal deviation.  Cardiovascular:     Rate and Rhythm: Normal rate.  Pulmonary:     Effort: Pulmonary effort is normal. No respiratory distress.     Breath sounds: No stridor.  Abdominal:     General: There is no distension.  Musculoskeletal:        General: Tenderness present. No swelling or deformity.     Cervical back: Neck supple.     Comments: Palpation left elbow, left shoulder and left trapezius region, no deformities, pain with range of motion, numbness erythema, strong radial pulse, normal sensation distally  Skin:    General: Skin is warm and dry.     Findings: No rash.  Neurological:     Mental Status: He is alert.     Cranial Nerves: Cranial nerve deficit: no gross deficits.     ED Results / Procedures / Treatments   Labs (all labs ordered are listed, but only abnormal results are displayed) Labs Reviewed - No data to display  EKG None  Radiology DG Cervical Spine Complete  Result Date: 01/19/2020 CLINICAL DATA:  Arm pain EXAM: CERVICAL SPINE - COMPLETE 4+ VIEW COMPARISON:  08/28/2005 FINDINGS: Straightening of the cervical spine. Dens and lateral masses are within normal limits. Moderate  degenerative change at C6-C7 with mild degenerative change at C7-T1. Probable foraminal narrowing bilaterally at C6-C7 and C7-T1. IMPRESSION: Straightening of the cervical spine with degenerative changes at C6-C7 and C7-T1 Electronically Signed   By: Donavan Foil M.D.   On: 01/19/2020 18:09   DG ELBOW COMPLETE LEFT (3+VIEW)  Result Date: 01/19/2020 CLINICAL DATA:  Arm pain EXAM: LEFT  ELBOW - COMPLETE 3+ VIEW COMPARISON:  None. FINDINGS: There is no evidence of fracture, dislocation, or joint effusion. There is no evidence of arthropathy or other focal bone abnormality. Soft tissues are unremarkable. IMPRESSION: Negative. Electronically Signed   By: Jasmine Pang M.D.   On: 01/19/2020 18:10   DG Shoulder Left  Result Date: 01/19/2020 CLINICAL DATA:  Arm pain EXAM: LEFT SHOULDER - 2+ VIEW COMPARISON:  None. FINDINGS: No fracture or malalignment. AC joint is intact. Minimal inferior glenohumeral degenerative change IMPRESSION: No acute osseous abnormality Electronically Signed   By: Jasmine Pang M.D.   On: 01/19/2020 18:09    Procedures Procedures (including critical care time)  Medications Ordered in ED Medications - No data to display  ED Course  I have reviewed the triage vital signs and the nursing notes.  Pertinent labs & imaging results that were available during my care of the patient were reviewed by me and considered in my medical decision making (see chart for details).    MDM Rules/Calculators/A&P                      X-rays do not show any acute abnormalities in the elbow or the shoulder.  Patient does have some evidence of foraminal narrowing at C6-C7 and C7-T1.  Suspect his symptoms may be related to a cervical radiculopathy.  Discussed outpt follow up with primary care doctor and consideration of mri if symptoms persist Final Clinical Impression(s) / ED Diagnoses Final diagnoses:  Cervical radiculopathy    Rx / DC Orders ED Discharge Orders         Ordered     predniSONE (DELTASONE) 50 MG tablet  Daily     01/19/20 1843    cyclobenzaprine (FLEXERIL) 10 MG tablet  2 times daily PRN     01/19/20 1843    etodolac (LODINE) 300 MG capsule  Every 8 hours    Note to Pharmacy: As needed for pain   01/19/20 1843           Linwood Dibbles, MD 01/19/20 1845

## 2020-01-19 NOTE — ED Triage Notes (Signed)
Left arm pain for a month. Limited movement.

## 2020-01-21 ENCOUNTER — Emergency Department (HOSPITAL_COMMUNITY)
Admission: EM | Admit: 2020-01-21 | Discharge: 2020-01-21 | Disposition: A | Discharge status: Home / Self Care | Payer: No Typology Code available for payment source | Attending: Emergency Medicine | Admitting: Emergency Medicine

## 2020-01-21 ENCOUNTER — Encounter (HOSPITAL_COMMUNITY): Payer: Self-pay | Admitting: Emergency Medicine

## 2020-01-21 ENCOUNTER — Other Ambulatory Visit: Payer: Self-pay

## 2020-01-21 DIAGNOSIS — Z7982 Long term (current) use of aspirin: Secondary | ICD-10-CM | POA: Diagnosis not present

## 2020-01-21 DIAGNOSIS — M79602 Pain in left arm: Secondary | ICD-10-CM | POA: Diagnosis not present

## 2020-01-21 DIAGNOSIS — I252 Old myocardial infarction: Secondary | ICD-10-CM | POA: Diagnosis not present

## 2020-01-21 DIAGNOSIS — Z79899 Other long term (current) drug therapy: Secondary | ICD-10-CM | POA: Diagnosis not present

## 2020-01-21 DIAGNOSIS — F17210 Nicotine dependence, cigarettes, uncomplicated: Secondary | ICD-10-CM | POA: Insufficient documentation

## 2020-01-21 DIAGNOSIS — Z7901 Long term (current) use of anticoagulants: Secondary | ICD-10-CM | POA: Diagnosis not present

## 2020-01-21 MED ORDER — PREDNISONE 20 MG PO TABS
60.0000 mg | ORAL_TABLET | Freq: Once | ORAL | Status: AC
  Administered 2020-01-21: 15:00:00 60 mg via ORAL
  Filled 2020-01-21: qty 3

## 2020-01-21 NOTE — ED Provider Notes (Addendum)
[facility] EMERGENCY DEPARTMENT Provider Note   CSN: [id number] Arrival date & time: 01/21/20  1212     History No chief complaint on file.   William Baxter is a 59 y.o. male.  HPI      William Baxter is a 59 y.o. male, with a history of MI, migraines, presenting to the ED with continued left arm pain.  He has been experiencing arm pain for the past month.  He states pain seems to start in the lateral left elbow and radiate toward the neck as well as towards the hand.  Occasional tingling in the left fourth and fifth fingers. He states the pain is a soreness and is accompanied by stiffness in the elbow in the morning.  This improves throughout the day. He was seen at another of our facilities in the ED 2 days ago and discharged with prescriptions for cyclobenzaprine, etodolac, and prednisone.  He states the pharmacy was unable to fill the prednisone until tomorrow. Denies procedures to the extremity. Denies fever/chills, falls/trauma, dizziness, headaches, numbness, weakness, chest pains, or any other complaints.   Past Medical History:  Diagnosis Date  . BPH (benign prostatic hyperplasia)   . Heart disease   . Medical history non-contributory   . Migraine   . Myocardial infarction acute [facility])     Patient Active Problem List   Diagnosis Date Noted  . Chest pain with moderate risk for cardiac etiology 06/14/2016  . Chest pain, moderate coronary artery risk 06/14/2016  . Headache   . Acute coronary syndrome (HCC) 12/16/2013    Past Surgical History:  Procedure Laterality Date  . CARDIAC CATHETERIZATION         No family history on file.  Social History   Tobacco Use  . Smoking status: Current Every Day Smoker    Packs/day: 0.00    Types: Cigarettes  . Smokeless tobacco: Never Used  Substance Use Topics  . Alcohol use: Yes    Comment: occ  . Drug use: No    Home Medications Prior to Admission medications   Medication Sig Start  Date End Date Taking? Authorizing Provider  acetaminophen (TYLENOL) 325 MG tablet Take 325 mg by mouth every 6 (six) hours as needed for mild pain.     [provider]  albuterol (PROVENTIL HFA;VENTOLIN HFA) 108 (90 Base) MCG/ACT inhaler Inhale 2 puffs into the lungs every 6 (six) hours as needed for wheezing or shortness of breath. 06/15/16   Joseph Art, DO  aspirin EC 81 MG EC tablet Take 1 tablet (81 mg total) by mouth daily. 12/19/13   Orpah Cobb, MD  atorvastatin (LIPITOR) 80 MG tablet Take 1 tablet (80 mg total) by mouth daily at 6 PM. 12/19/13   Orpah Cobb, MD  ciprofloxacin (CIPRO) 500 MG tablet Take 1 tablet (500 mg total) by mouth 2 (two) times daily. 09/11/19   Tanda Rockers, PA-C  clopidogrel (PLAVIX) 75 MG tablet Take 1 tablet (75 mg total) by mouth daily with breakfast. 12/19/13   Orpah Cobb, MD  cyclobenzaprine (FLEXERIL) 10 MG tablet Take 1 tablet (10 mg total) by mouth 2 (two) times daily as needed for muscle spasms. 01/19/20   Linwood Dibbles, MD  etodolac (LODINE) 300 MG capsule Take 1 capsule (300 mg total) by mouth every 8 (eight) hours. 01/19/20   Linwood Dibbles, MD  isosorbide mononitrate (IMDUR) 60 MG 24 hr tablet Take 60 mg by mouth daily.    [provider]  metoprolol (  LOPRESSOR) 25 MG tablet Take 1 tablet (25 mg total) by mouth 2 (two) times daily. 06/15/16   Joseph Art, DO  metroNIDAZOLE (FLAGYL) 500 MG tablet Take 1 tablet (500 mg total) by mouth 2 (two) times daily. 09/11/19   Hyman Hopes, Margaux, PA-C  nitroGLYCERIN (NITROSTAT) 0.4 MG SL tablet Place 1 tablet (0.4 mg total) under the tongue every 5 (five) minutes as needed for chest pain. 12/19/13   Orpah Cobb, MD  oxyCODONE-acetaminophen (PERCOCET/ROXICET) 5-325 MG tablet Take 1 tablet by mouth every 6 (six) hours as needed for severe pain. 09/11/19   Tanda Rockers, PA-C  permethrin (ELIMITE) 5 % cream Apply Head to toe and wash off after 8 hours 03/11/17   Lawyer, Cristal Deer, PA-C  polyvinyl  alcohol (LIQUIFILM TEARS) 1.4 % ophthalmic solution Place 1 drop into both eyes as needed for dry eyes.    [provider]  predniSONE (DELTASONE) 50 MG tablet Take 1 tablet (50 mg total) by mouth daily. 01/19/20   Linwood Dibbles, MD  tamsulosin (FLOMAX) 0.4 MG CAPS capsule Take 0.4 mg by mouth daily.    [provider]    Allergies    Penicillins  Review of Systems   Review of Systems  Constitutional: Negative for chills and fever.  Respiratory: Negative for shortness of breath.   Cardiovascular: Negative for chest pain.  Musculoskeletal: Positive for arthralgias. Negative for back pain.  Neurological: Negative for weakness and numbness.    Physical Exam Updated Vital Signs BP 124/80   Pulse 82   Temp 97.8 F (36.6 C) (Oral)   Resp 18   SpO2 98%   Physical Exam Vitals and nursing note reviewed.  Constitutional:      General: He is not in acute distress.    Appearance: He is well-developed. He is not diaphoretic.  HENT:     Head: Normocephalic and atraumatic.  Eyes:     Conjunctiva/sclera: Conjunctivae normal.  Cardiovascular:     Rate and Rhythm: Normal rate and regular rhythm.     Pulses:          Radial pulses are 2+ on the left side.  Pulmonary:     Effort: Pulmonary effort is normal.  Musculoskeletal:     Cervical back: Neck supple.     Comments: Full range of motion without noted difficulty or hesitation in the left elbow, though he indicates this is occasionally painful.  No swelling, deformity, instability, erythema, or increased warmth anywhere in the left upper extremity. Full range of motion without pain or noted difficulty in the left shoulder or wrist.  No pain with range of motion of the neck.  Skin:    General: Skin is warm and dry.     Capillary Refill: Capillary refill takes less than 2 seconds.     Coloration: Skin is not pale.  Neurological:     Mental Status: He is alert.     Comments: Sensation grossly intact to light touch through  each of the nerve distributions of the bilateral upper extremities. Abduction and adduction of the fingers intact against resistance. Grip strength equal bilaterally. Supination and pronation intact against resistance. Strength 5/5 through the cardinal directions of the bilateral wrists. Strength 5/5 with flexion and extension of the bilateral elbows. Patient can touch the thumb to each one of the fingertips without difficulty.  Patient can hold the "OK" sign against resistance.  Psychiatric:        Behavior: Behavior normal.     ED Results / Procedures /  Treatments   Labs (all labs ordered are listed, but only abnormal results are displayed) Labs Reviewed - No data to display  EKG None  Radiology DG Cervical Spine Complete  Result Date: 01/19/2020 CLINICAL DATA:  Arm pain EXAM: CERVICAL SPINE - COMPLETE 4+ VIEW COMPARISON:  08/28/2005 FINDINGS: Straightening of the cervical spine. Dens and lateral masses are within normal limits. Moderate degenerative change at C6-C7 with mild degenerative change at C7-T1. Probable foraminal narrowing bilaterally at C6-C7 and C7-T1. IMPRESSION: Straightening of the cervical spine with degenerative changes at C6-C7 and C7-T1 Electronically Signed   By: Donavan Foil M.D.   On: 01/19/2020 18:09   DG ELBOW COMPLETE LEFT (3+VIEW)  Result Date: 01/19/2020 CLINICAL DATA:  Arm pain EXAM: LEFT ELBOW - COMPLETE 3+ VIEW COMPARISON:  None. FINDINGS: There is no evidence of fracture, dislocation, or joint effusion. There is no evidence of arthropathy or other focal bone abnormality. Soft tissues are unremarkable. IMPRESSION: Negative. Electronically Signed   By: Donavan Foil M.D.   On: 01/19/2020 18:10   DG Shoulder Left  Result Date: 01/19/2020 CLINICAL DATA:  Arm pain EXAM: LEFT SHOULDER - 2+ VIEW COMPARISON:  None. FINDINGS: No fracture or malalignment. AC joint is intact. Minimal inferior glenohumeral degenerative change IMPRESSION: No acute osseous  abnormality Electronically Signed   By: Donavan Foil M.D.   On: 01/19/2020 18:09    Procedures Procedures (including critical care time)  Medications Ordered in ED Medications  predniSONE (DELTASONE) tablet 60 mg (60 mg Oral Given 01/21/20 1506)    ED Course  I have reviewed the triage vital signs and the nursing notes.  Pertinent labs & imaging results that were available during my care of the patient were reviewed by me and considered in my medical decision making (see chart for details).    MDM Rules/Calculators/A&P                      Patient presents with continued pain to left arm.  He has no evidence of vascular compromise.  No evidence of focal neurologic deficit. We discussed his goals of care and ultimately he was satisfied with a dose of prednisone here since he would not be able to pick up his prednisone prescription until tomorrow. The patient was given instructions for home care as well as return precautions. Patient voices understanding of these instructions, accepts the plan, and is comfortable with discharge.  I reviewed and interpreted the patient's radiological studies from two days ago.   Final Clinical Impression(s) / ED Diagnoses Final diagnoses:  Pain of left upper extremity    Rx / DC Orders ED Discharge Orders    None       Layla Maw 01/21/20 1559    Lorayne Bender, PA-C 01/21/20 1600    Mesner, Corene Cornea, MD 01/22/20 0830

## 2020-01-21 NOTE — ED Triage Notes (Addendum)
Patient reports L arm pain that radiates up from his elbow to his left neck x1 month,s tates he was seen at [facility] Friday and told he has arthritis. Reports he was given some medications which he took but pain has worsened. Endorses decreased ROM and increased pain with movement as well as tightness in L neck with movement. Denies n/v/ chest pain or sob.

## 2020-01-21 NOTE — Discharge Instructions (Addendum)
  Take it easy, but do not lay around too much as this may make any stiffness worse.  Antiinflammatory medications: Take 600 mg of ibuprofen every 6 hours or 440 mg (over the counter dose) to 500 mg (prescription dose) of naproxen every 12 hours for the next 3 days. After this time, these medications may be used as needed for pain. Take these medications with food to avoid upset stomach. Choose only one of these medications, do not take them together. Acetaminophen (generic for Tylenol): Should you continue to have additional pain while taking the ibuprofen or naproxen, you may add in acetaminophen as needed. Your daily total maximum amount of acetaminophen from all sources should be limited to 4000mg /day for persons without liver problems, or 2000mg /day for those with liver problems. Cyclobenzaprine: Cyclobenzaprine (generic for Flexeril) is a muscle relaxer and can help relieve stiff muscles or muscle spasms.  Do not drive or perform other dangerous activities while taking this medication as it can cause drowsiness as well as changes in reaction time and judgement. Prednisone: Take the prednisone, as prescribed, until finished. If you are a diabetic, please know prednisone can raise your blood sugar temporarily. Lidocaine patches: These are available via either prescription or over-the-counter. The over-the-counter option may be more economical one and are likely just as effective. There are multiple over-the-counter brands, such as Salonpas. Ice: May apply ice to the area over the next 24 hours for 15 minutes at a time to reduce pain, inflammation, and swelling, if present.  Follow up: Follow-up with orthopedic specialist on this matter. Be sure to follow-up within the next 2 weeks. Return: Return to the ED should symptoms worsen.  For prescription assistance, may try using prescription discount sites or apps, such as goodrx.com

## 2020-01-25 ENCOUNTER — Emergency Department (HOSPITAL_COMMUNITY)
Admission: EM | Admit: 2020-01-25 | Discharge: 2020-01-25 | Disposition: A | Discharge status: Home / Self Care | Payer: No Typology Code available for payment source | Attending: Emergency Medicine | Admitting: Emergency Medicine

## 2020-01-25 ENCOUNTER — Encounter (HOSPITAL_COMMUNITY): Payer: Self-pay | Admitting: Emergency Medicine

## 2020-01-25 ENCOUNTER — Other Ambulatory Visit: Payer: Self-pay

## 2020-01-25 DIAGNOSIS — Z7901 Long term (current) use of anticoagulants: Secondary | ICD-10-CM | POA: Insufficient documentation

## 2020-01-25 DIAGNOSIS — M5412 Radiculopathy, cervical region: Secondary | ICD-10-CM | POA: Diagnosis not present

## 2020-01-25 DIAGNOSIS — Z79899 Other long term (current) drug therapy: Secondary | ICD-10-CM | POA: Insufficient documentation

## 2020-01-25 DIAGNOSIS — Z7982 Long term (current) use of aspirin: Secondary | ICD-10-CM | POA: Diagnosis not present

## 2020-01-25 DIAGNOSIS — M542 Cervicalgia: Secondary | ICD-10-CM | POA: Diagnosis present

## 2020-01-25 DIAGNOSIS — F17210 Nicotine dependence, cigarettes, uncomplicated: Secondary | ICD-10-CM | POA: Insufficient documentation

## 2020-01-25 MED ORDER — KETOROLAC TROMETHAMINE 60 MG/2ML IM SOLN
30.0000 mg | Freq: Once | INTRAMUSCULAR | Status: AC
  Administered 2020-01-25: 09:00:00 30 mg via INTRAMUSCULAR
  Filled 2020-01-25: qty 2

## 2020-01-25 MED ORDER — DIAZEPAM 2 MG PO TABS
2.0000 mg | ORAL_TABLET | Freq: Once | ORAL | Status: AC
  Administered 2020-01-25: 2 mg via ORAL
  Filled 2020-01-25: qty 1

## 2020-01-25 NOTE — ED Provider Notes (Signed)
[facility]-EMERGENCY DEPT Provider Note   CSN: [id number] Arrival date & time: 01/25/20  4098     History Chief Complaint  Patient presents with  . Shoulder Pain  . left arm pain  . Neck Pain    William Baxter is a 59 y.o. male.  Presents ER with chief complaint of left sided neck pain, left arm pain.  Reports symptoms have been going on for about a month.  Worse over the last couple weeks.  Went to ER x2, went to [place] x1.  Has been prescribed course of steroids, Percocet, muscle relaxer.  Reports he has been taking Motrin intermittently, started the steroid course on Monday.  Has had some intermittent relief from these medicines but still having ongoing symptoms.  No numbness or weakness.  Pain seems to be relatively constant, worse with certain positions.  No neck stiffness.  Pain radiates from left side of neck down left arm.  No falls.  Has not followed up with Ortho as previously recommended.  HPI     Past Medical History:  Diagnosis Date  . BPH (benign prostatic hyperplasia)   . Heart disease   . Medical history non-contributory   . Migraine   . Myocardial infarction acute [facility])     Patient Active Problem List   Diagnosis Date Noted  . Chest pain with moderate risk for cardiac etiology 06/14/2016  . Chest pain, moderate coronary artery risk 06/14/2016  . Headache   . Acute coronary syndrome (HCC) 12/16/2013    Past Surgical History:  Procedure Laterality Date  . CARDIAC CATHETERIZATION         No family history on file.  Social History   Tobacco Use  . Smoking status: Current Every Day Smoker    Packs/day: 0.00    Types: Cigarettes  . Smokeless tobacco: Never Used  Substance Use Topics  . Alcohol use: Yes    Comment: occ  . Drug use: No    Home Medications Prior to Admission medications   Medication Sig Start Date End Date Taking? Authorizing Provider  acetaminophen (TYLENOL) 325 MG tablet Take 325 mg by mouth every 6 (six)  hours as needed for mild pain.     [provider]  albuterol (PROVENTIL HFA;VENTOLIN HFA) 108 (90 Base) MCG/ACT inhaler Inhale 2 puffs into the lungs every 6 (six) hours as needed for wheezing or shortness of breath. 06/15/16   Joseph Art, DO  aspirin EC 81 MG EC tablet Take 1 tablet (81 mg total) by mouth daily. 12/19/13   Orpah Cobb, MD  atorvastatin (LIPITOR) 80 MG tablet Take 1 tablet (80 mg total) by mouth daily at 6 PM. 12/19/13   Orpah Cobb, MD  ciprofloxacin (CIPRO) 500 MG tablet Take 1 tablet (500 mg total) by mouth 2 (two) times daily. 09/11/19   Tanda Rockers, PA-C  clopidogrel (PLAVIX) 75 MG tablet Take 1 tablet (75 mg total) by mouth daily with breakfast. 12/19/13   Orpah Cobb, MD  cyclobenzaprine (FLEXERIL) 10 MG tablet Take 1 tablet (10 mg total) by mouth 2 (two) times daily as needed for muscle spasms. 01/19/20   Linwood Dibbles, MD  etodolac (LODINE) 300 MG capsule Take 1 capsule (300 mg total) by mouth every 8 (eight) hours. 01/19/20   Linwood Dibbles, MD  isosorbide mononitrate (IMDUR) 60 MG 24 hr tablet Take 60 mg by mouth daily.    [provider]  metoprolol (LOPRESSOR) 25 MG tablet Take 1 tablet (25 mg total) by  mouth 2 (two) times daily. 06/15/16   Joseph Art, DO  metroNIDAZOLE (FLAGYL) 500 MG tablet Take 1 tablet (500 mg total) by mouth 2 (two) times daily. 09/11/19   Hyman Hopes, Margaux, PA-C  nitroGLYCERIN (NITROSTAT) 0.4 MG SL tablet Place 1 tablet (0.4 mg total) under the tongue every 5 (five) minutes as needed for chest pain. 12/19/13   Orpah Cobb, MD  oxyCODONE-acetaminophen (PERCOCET/ROXICET) 5-325 MG tablet Take 1 tablet by mouth every 6 (six) hours as needed for severe pain. 09/11/19   Tanda Rockers, PA-C  permethrin (ELIMITE) 5 % cream Apply Head to toe and wash off after 8 hours 03/11/17   Lawyer, Cristal Deer, PA-C  polyvinyl alcohol (LIQUIFILM TEARS) 1.4 % ophthalmic solution Place 1 drop into both eyes as needed for dry eyes.    [provider]  predniSONE (DELTASONE) 50 MG tablet Take 1 tablet (50 mg total) by mouth daily. 01/19/20   Linwood Dibbles, MD  tamsulosin (FLOMAX) 0.4 MG CAPS capsule Take 0.4 mg by mouth daily.    [provider]    Allergies    Penicillins  Review of Systems   Review of Systems  Constitutional: Negative for chills and fever.  HENT: Negative for ear pain and sore throat.   Eyes: Negative for pain and visual disturbance.  Respiratory: Negative for cough and shortness of breath.   Cardiovascular: Negative for chest pain and palpitations.  Gastrointestinal: Negative for abdominal pain and vomiting.  Genitourinary: Negative for dysuria and hematuria.  Musculoskeletal: Positive for back pain and neck pain. Negative for arthralgias.  Skin: Negative for color change and rash.  Neurological: Negative for seizures and syncope.  All other systems reviewed and are negative.   Physical Exam Updated Vital Signs BP 116/68   Pulse 80   Temp 98.3 F (36.8 C) (Oral)   Resp 15   SpO2 100%   Physical Exam Vitals and nursing note reviewed.  Constitutional:      Appearance: He is well-developed.  HENT:     Head: Normocephalic and atraumatic.  Eyes:     Conjunctiva/sclera: Conjunctivae normal.  Cardiovascular:     Rate and Rhythm: Normal rate and regular rhythm.     Heart sounds: No murmur.  Pulmonary:     Effort: Pulmonary effort is normal. No respiratory distress.     Breath sounds: Normal breath sounds.  Abdominal:     Palpations: Abdomen is soft.     Tenderness: There is no abdominal tenderness.  Musculoskeletal:     Cervical back: Neck supple.  Skin:    General: Skin is warm and dry.  Neurological:     General: No focal deficit present.     Mental Status: He is alert and oriented to person, place, and time.     Comments: 5 out of 5 strength throughout bilateral upper extremities, sensation to light touch intact in upper extremities  Psychiatric:        Mood and Affect:  Mood normal.        Behavior: Behavior normal.     ED Results / Procedures / Treatments   Labs (all labs ordered are listed, but only abnormal results are displayed) Labs Reviewed - No data to display  EKG None  Radiology No results found.  Procedures Procedures (including critical care time)  Medications Ordered in ED Medications  ketorolac (TORADOL) injection 30 mg (30 mg Intramuscular Given 01/25/20 0909)  diazepam (VALIUM) tablet 2 mg (2 mg Oral Given 01/25/20 0908)    ED  Course  I have reviewed the triage vital signs and the nursing notes.  Pertinent labs & imaging results that were available during my care of the patient were reviewed by me and considered in my medical decision making (see chart for details).    MDM Rules/Calculators/A&P                      59 year old male with left neck pain rating down left arm.  Based on symptomatology, suspect cervical radiculopathy.  He is otherwise well-appearing, has normal neurologic exam.  Prior C-spine images plain films negative.  Recommend patient continue previously prescribed medications.  I do not feel he needs any additional meds.  Recommend he have follow-up with outpatient Ortho.  Patient was agreeable, provided soft collar to use as needed.  Discharged home.    After the discussed management above, the patient was determined to be safe for discharge.  The patient was in agreement with this plan and all questions regarding their care were answered.  ED return precautions were discussed and the patient will return to the ED with any significant worsening of condition.   Final Clinical Impression(s) / ED Diagnoses Final diagnoses:  Cervical radiculopathy    Rx / DC Orders ED Discharge Orders    None       Lucrezia Starch, MD 01/25/20 1551

## 2020-01-25 NOTE — Discharge Instructions (Signed)
Recommend continue the previously prescribed prednisone.  Please schedule follow-up appointment with the orthopedic doctor. Return if you develop worsening weakness, numbness. Additionally can take the previously prescribed Percocet as needed.  Additionally recommend Motrin or naproxen.  Recommend follow-up as well with your primary doctor at the [place].  Can use the soft collar as needed for support.

## 2020-01-25 NOTE — ED Triage Notes (Signed)
Pt reports that for over a month had left arm, neck and shoulder pains. Denies injuries or falls.

## 2020-01-29 ENCOUNTER — Other Ambulatory Visit: Payer: Self-pay

## 2020-01-29 ENCOUNTER — Ambulatory Visit (INDEPENDENT_AMBULATORY_CARE_PROVIDER_SITE_OTHER): Payer: Medicare PPO | Admitting: Orthopaedic Surgery

## 2020-01-29 ENCOUNTER — Encounter: Payer: Self-pay | Admitting: Orthopaedic Surgery

## 2020-01-29 VITALS — Ht 71.0 in | Wt 170.0 lb

## 2020-01-29 DIAGNOSIS — M542 Cervicalgia: Secondary | ICD-10-CM | POA: Diagnosis not present

## 2020-01-29 DIAGNOSIS — M5412 Radiculopathy, cervical region: Secondary | ICD-10-CM | POA: Diagnosis not present

## 2020-01-29 MED ORDER — OXYCODONE-ACETAMINOPHEN 5-325 MG PO TABS: 1.0000 | ORAL_TABLET | Freq: Four times a day (QID) | ORAL | 0 refills | Status: DC | PRN

## 2020-01-29 NOTE — Progress Notes (Signed)
Office Visit Note   Patient: William Baxter           Date of Birth: May 16, 1961           MRN: [id number] Visit Date: 01/29/2020              Requested by: No referring provider defined for this encounter. PCP: Patient, No Pcp Per   Assessment & Plan: Visit Diagnoses:  1. Neck pain   2. Radiculopathy, cervical region     Plan: Patient needs to be scheduled for urgent MRI due to his progressive arm weakness severe pain.  Office follow-up after scan for review.  Follow-Up Instructions: after Cervical MRI  Orders:  Orders Placed This Encounter  Procedures  . MR Cervical Spine w/o contrast   Meds ordered this encounter  Medications  . DISCONTD: oxyCODONE-acetaminophen (PERCOCET/ROXICET) 5-325 MG tablet    Sig: Take 1 tablet by mouth every 6 (six) hours as needed for severe pain. cerivical disc    Dispense:  20 tablet    Refill:  0  . oxyCODONE-acetaminophen (PERCOCET/ROXICET) 5-325 MG tablet    Sig: Take 1 tablet by mouth every 6 (six) hours as needed for severe pain.    Dispense:  20 tablet    Refill:  0      Procedures: No procedures performed   Clinical Data: No additional findings.   Subjective: Chief Complaint  Patient presents with  . Neck - Pain  . Left Arm - Pain    HPI 59 year old male seen with recent onset of severe neck pain shoulder pain that in pain that radiates down his left arm into middle fingers of his hand.  He has been in the emergency room twice on 3/19 and 01/25/2020 for severe pain.  Has been on Percocet prednisone Dosepak muscle relaxant without relief.  Patient states "I cannot take this any longer".  He states he gets slight relief keeping his left arm on top of his head.  Patient not able to sleep he is having trouble getting dressed.  Pain is so severe it bothers him anytime he tries to do anything productive.  Initially his pain was in his elbow and then radiated up to his shoulder.  He has had past history of myocardial infarction  2015.Marland Kitchen Last emergency room visit was 4 days ago.  Patient is also been to the [facility] for treatment.  He got slight improvement he believes with the steroid pack.  He denies any lower extremity weakness no fever chills no falling.  Review of Systems positive for acute coronary syndrome with MI 2015.  Otherwise negative as it pertains HPI.   Objective: Vital Signs: Ht 5\' 11"  (1.803 m)   Wt 170 lb (77.1 kg)   BMI 23.71 kg/m   Physical Exam Constitutional:      Appearance: He is well-developed.  HENT:     Head: Normocephalic and atraumatic.  Eyes:     Pupils: Pupils are equal, round, and reactive to light.  Neck:     Thyroid: No thyromegaly.     Trachea: No tracheal deviation.  Cardiovascular:     Rate and Rhythm: Normal rate.  Pulmonary:     Effort: Pulmonary effort is normal.     Breath sounds: No wheezing.  Abdominal:     General: Bowel sounds are normal.     Palpations: Abdomen is soft.  Skin:    General: Skin is warm and dry.     Capillary Refill: Capillary refill takes less  than 2 seconds.  Neurological:     Mental Status: He is alert and oriented to person, place, and time.  Psychiatric:        Behavior: Behavior normal.        Thought Content: Thought content normal.        Judgment: Judgment normal.     Ortho Exam patient has severe brachial plexus tenderness on the left.  He has increased pain when he takes his arm down from the top of his head which is the position is preferred now for over 2 weeks.  He has some triceps weakness on the left normal on the right slight wrist flexion weakness all consistent with C7 radiculopathy.  Opposite right side is normal.  Specialty Comments:  No specialty comments available.  Imaging: No results found.   PMFS History: Patient Active Problem List   Diagnosis Date Noted  . Radiculopathy, cervical region 02/05/2020  . Chest pain with moderate risk for cardiac etiology 06/14/2016  . Chest pain, moderate coronary artery risk  06/14/2016  . Headache   . Acute coronary syndrome (HCC) 12/16/2013   Past Medical History:  Diagnosis Date  . BPH (benign prostatic hyperplasia)   . Heart disease   . Medical history non-contributory   . Migraine   . Myocardial infarction acute (HCC)     No family history on file.  Past Surgical History:  Procedure Laterality Date  . CARDIAC CATHETERIZATION     Social History   Occupational History  . Not on file  Tobacco Use  . Smoking status: Current Every Day Smoker    Packs/day: 0.00    Types: Cigarettes  . Smokeless tobacco: Never Used  Substance and Sexual Activity  . Alcohol use: Yes    Comment: occ  . Drug use: No  . Sexual activity: Not on file

## 2020-02-03 ENCOUNTER — Other Ambulatory Visit: Payer: Self-pay

## 2020-02-03 ENCOUNTER — Ambulatory Visit (HOSPITAL_BASED_OUTPATIENT_CLINIC_OR_DEPARTMENT_OTHER)
Admission: RE | Admit: 2020-02-03 | Discharge: 2020-02-03 | Disposition: A | Discharge status: Home / Self Care | Payer: Medicare PPO | Source: Ambulatory Visit | Attending: Orthopaedic Surgery | Admitting: Orthopaedic Surgery

## 2020-02-03 DIAGNOSIS — M542 Cervicalgia: Secondary | ICD-10-CM | POA: Insufficient documentation

## 2020-02-05 DIAGNOSIS — M5412 Radiculopathy, cervical region: Secondary | ICD-10-CM | POA: Insufficient documentation

## 2020-02-06 ENCOUNTER — Ambulatory Visit (INDEPENDENT_AMBULATORY_CARE_PROVIDER_SITE_OTHER): Payer: Medicare PPO

## 2020-02-06 ENCOUNTER — Encounter: Payer: Self-pay | Admitting: Orthopaedic Surgery

## 2020-02-06 ENCOUNTER — Other Ambulatory Visit: Payer: Self-pay

## 2020-02-06 ENCOUNTER — Ambulatory Visit (INDEPENDENT_AMBULATORY_CARE_PROVIDER_SITE_OTHER): Payer: Medicare PPO | Admitting: Orthopaedic Surgery

## 2020-02-06 VITALS — BP 127/86 | HR 123 | Ht 71.0 in | Wt 170.0 lb

## 2020-02-06 DIAGNOSIS — M5412 Radiculopathy, cervical region: Secondary | ICD-10-CM

## 2020-02-06 DIAGNOSIS — M79602 Pain in left arm: Secondary | ICD-10-CM | POA: Diagnosis not present

## 2020-02-06 MED ORDER — OXYCODONE-ACETAMINOPHEN 5-325 MG PO TABS: 1.0000 | ORAL_TABLET | Freq: Four times a day (QID) | ORAL | 0 refills | Status: DC | PRN

## 2020-02-06 NOTE — Progress Notes (Signed)
Office Visit Note   Patient: William Baxter           Date of Birth: 10-24-1961           MRN: [id number] Visit Date: 02/06/2020              Requested by: No referring provider defined for this encounter. PCP: Patient, No Pcp Per   Assessment & Plan: Visit Diagnoses:  1. Radiculopathy, cervical region     Plan: Patient has significant foraminal stenosis at C6-7 and C7-T1 but had severe sudden onset of incapacitating neck and left arm pain.  A lot of this around the shoulder and some of the areas of his extreme tenderness such as palpation over the triceps and also palpation over the radial groove mid humerus is much more significant than would be expected.  I recommend a EMG and nerve conduction velocities of both upper extremities and then we can make a decision about appropriate treatment.  Patient is in excruciating pain Percocet was renewed.  Follow-Up Instructions: Return after EMG nerve conduction velocities upper extremities.  Orders:  No orders of the defined types were placed in this encounter.  No orders of the defined types were placed in this encounter.     Procedures: No procedures performed   Clinical Data: No additional findings.   Subjective: Chief Complaint  Patient presents with  . Neck - Pain, Follow-up    MRI Cervical Spine Review    HPI 59 year old male returns with severe neck pain that began 3 and half weeks ago.  He has been to the emergency room twice been in severe pain taking Percocet.  Prednisone Dosepak muscle relaxants have not helped.  Percocet gave him some improvement.  He has to keep his left arm on the top of his hand and has had an MRI scan which is available for review.  This shows marked foraminal stenosis on the left at C6-7 and C7-T1.  He does not have any central compression.  He states he has a lot of pain around his elbow pain in the triceps and pain that shoots down to his ulnar 2 fingers.  He denies fever chills no shoulder  subluxation issues no falls.  Review of Systems previous MI no current chest pain problems negative for CVA.  No previous neck surgeries.   Objective: Vital Signs: BP 127/86   Pulse (!) 123   Ht 5\' 11"  (1.803 m)   Wt 170 lb (77.1 kg)   BMI 23.71 kg/m   Physical Exam Constitutional:      Appearance: He is well-developed.  HENT:     Head: Normocephalic and atraumatic.  Eyes:     Pupils: Pupils are equal, round, and reactive to light.  Neck:     Thyroid: No thyromegaly.     Trachea: No tracheal deviation.  Cardiovascular:     Rate and Rhythm: Normal rate.  Pulmonary:     Effort: Pulmonary effort is normal.     Breath sounds: No wheezing.  Abdominal:     General: Bowel sounds are normal.     Palpations: Abdomen is soft.  Skin:    General: Skin is warm and dry.     Capillary Refill: Capillary refill takes less than 2 seconds.  Neurological:     Mental Status: He is alert and oriented to person, place, and time.  Psychiatric:        Behavior: Behavior normal.        Thought Content: Thought  content normal.        Judgment: Judgment normal.     Ortho Exam patient has severe brachial plexus tenderness on the left.  He can rotate to the right but has for percent rotation of the left with sharp pain.  He keeps his head in flexed position and when he tries to extend he can only extend to neutral position and has severe pain.  There is severe tenderness with palpation over the distal triceps tendon without palpable gap.  Denies tenderness with palpation over the left colon.  No elbow effusion ulnar nerve has some discomfort with positive Tinel's.  He has interosseous weakness on the left, normal on the right but no left hand interosseous atrophy.  Triceps reflexes trace to absent 2+ biceps 2+ brachioradialis.  With distraction he has severe complaints of pain with palpation over the distal triceps tendon and withdraws and vocalizes.  Negative pinch test on the upper extremity  reproducing his pain.  Specialty Comments:  No specialty comments available.  Imaging: CLINICAL DATA:  Left neck and shoulder pain  EXAM: MRI CERVICAL SPINE WITHOUT CONTRAST  TECHNIQUE: Multiplanar, multisequence MR imaging of the cervical spine was performed. No intravenous contrast was administered.  COMPARISON:  None.  FINDINGS: Alignment: Trace degenerative listhesis.  Vertebrae: There are chronic appearing degenerative endplate marrow changes at C6 and C7. No substantial marrow edema. No suspicious osseous lesion.  Cord: No abnormal signal.  Posterior Fossa, vertebral arteries, paraspinal tissues: Unremarkable.  Disc levels:  C2-C3: Small central protrusion. Mild facet hypertrophy. Minor canal stenosis. No significant foraminal stenosis.  C3-C4: Small right foraminal disc osteophyte complex. No significant canal or left foraminal stenosis. Minor right foraminal stenosis.  C4-C5: Minimal disc bulge with endplate osteophytic ridging. No significant canal or left foraminal stenosis. Minor right foraminal stenosis.  C5-C6: Minimal disc bulge. No significant canal or foraminal stenosis.  C6-C7: Disc osteophyte complex and uncovertebral hypertrophy. No significant canal stenosis. Marked foraminal stenosis.  C7-T1: Disc osteophyte complex with possible superimposed left foraminal protrusion which. No significant canal stenosis. Moderate right and marked left foraminal stenosis.  IMPRESSION: Multilevel degenerative changes as detailed above. No high-grade canal stenosis. There is significant left foraminal stenosis at C6-C7 and C7-T1.   Electronically Signed   By: Guadlupe Spanish M.D.   On: 02/03/2020 20:24  P AP lateral chest x-ray obtained and reviewed.  Heart is normal in size lung fields clear no evidence of Pancoast lesion left side.  Impression: No active cardiopulmonary disease.  PMFS History: Patient Active Problem List    Diagnosis Date Noted  . Radiculopathy, cervical region 02/05/2020  . Chest pain with moderate risk for cardiac etiology 06/14/2016  . Chest pain, moderate coronary artery risk 06/14/2016  . Headache   . Acute coronary syndrome (HCC) 12/16/2013   Past Medical History:  Diagnosis Date  . BPH (benign prostatic hyperplasia)   . Heart disease   . Medical history non-contributory   . Migraine   . Myocardial infarction acute (HCC)     No family history on file.  Past Surgical History:  Procedure Laterality Date  . CARDIAC CATHETERIZATION     Social History   Occupational History  . Not on file  Tobacco Use  . Smoking status: Current Every Day Smoker    Packs/day: 0.00    Types: Cigarettes  . Smokeless tobacco: Never Used  Substance and Sexual Activity  . Alcohol use: Yes    Comment: occ  . Drug use: No  . Sexual  activity: Not on file

## 2020-02-08 LAB — URIC ACID: Uric Acid, Serum: 6.1 mg/dL (ref 4.0–8.0)

## 2020-02-15 ENCOUNTER — Ambulatory Visit (INDEPENDENT_AMBULATORY_CARE_PROVIDER_SITE_OTHER): Payer: Medicare PPO | Admitting: Physical Medicine and Rehabilitation

## 2020-02-15 ENCOUNTER — Encounter: Payer: Self-pay | Admitting: Physical Medicine and Rehabilitation

## 2020-02-15 ENCOUNTER — Other Ambulatory Visit: Payer: Self-pay

## 2020-02-15 DIAGNOSIS — R202 Paresthesia of skin: Secondary | ICD-10-CM

## 2020-02-15 NOTE — Progress Notes (Signed)
 .  Numeric Pain Rating Scale and Functional Assessment Average Pain 8   In the last MONTH (on 0-10 scale) has pain interfered with the following?  1. General activity like being  able to carry out your everyday physical activities such as walking, climbing stairs, carrying groceries, or moving a chair?  Rating(8)   

## 2020-02-15 NOTE — Progress Notes (Signed)
William Baxter - 59 y.o. male MRN [id number]  Date of birth: 27-Apr-1961  Office Visit Note: Visit Date: 02/15/2020 PCP: Patient, No Pcp Per Referred by: Marybelle Killings, MD  Subjective: Chief Complaint  Patient presents with  . Neck - Pain  . Left Shoulder - Pain  . Left Hand - Pain, Numbness  . Left Arm - Pain, Numbness   HPI:  William Baxter is a 59 y.o. male who comes in today For electrodiagnostic study of the left upper limb at the request of Dr. Rodell Perna.  Patient is right-hand dominant with chronic worsening severe neck pain with referral pain down the left arm with numbness and tingling into the left arm and somewhat globally in the left hand.  He does endorse more symptoms and but the medial 2 digits.  He does have a lot of elbow pain.  He has no symptoms on the right.  Cervical MRI is reviewed below with multilevel stenosis.  He does have some foraminal narrowing on the left at C6-7and C 7-T1.  No history of prior letter diagnostic studies.  No history of diabetes.  ROS Otherwise per HPI.  Assessment & Plan: Visit Diagnoses:  1. Paresthesia of skin     Plan:  Impression: The above electrodiagnostic study is ABNORMAL and reveals evidence of a mild left median nerve entrapment at the wrist (carpal tunnel syndrome) affecting sensory components AND a mild left ulnar nerve entrapment at the wrist affecting sensory components.   There is no significant electrodiagnostic evidence of any other focal nerve entrapment, brachial plexopathy or cervical radiculopathy.   Recommendations: 1.  Follow-up with referring physician. 2.  Continue current management of symptoms.  Meds & Orders: No orders of the defined types were placed in this encounter.   Orders Placed This Encounter  Procedures  . NCV with EMG (electromyography)    Follow-up: Return for Rodell Perna, M.D..   Procedures: No procedures performed  EMG & NCV Findings: Evaluation of the left median motor nerve  showed decreased conduction velocity (Elbow-Wrist, 49 m/s).  The left median (across palm) sensory nerve showed no response (Palm), prolonged distal peak latency (3.8 ms), and reduced amplitude (6.3 V).  The left ulnar sensory nerve showed prolonged distal peak latency (6.4 ms), reduced amplitude (14.5 V), and decreased conduction velocity (Wrist-5th Digit, 22 m/s).  All remaining nerves (as indicated in the following tables) were within normal limits.    All examined muscles (as indicated in the following table) showed no evidence of electrical instability.    Impression: The above electrodiagnostic study is ABNORMAL and reveals evidence of a mild left median nerve entrapment at the wrist (carpal tunnel syndrome) affecting sensory components AND a mild left ulnar nerve entrapment at the wrist affecting sensory components.   There is no significant electrodiagnostic evidence of any other focal nerve entrapment, brachial plexopathy or cervical radiculopathy.   Recommendations: 1.  Follow-up with referring physician. 2.  Continue current management of symptoms.  ___________________________ Laurence Spates FAAPMR Board Certified, American Board of Physical Medicine and Rehabilitation    Nerve Conduction Studies Anti Sensory Summary Table   Stim Site NR Peak (ms) Norm Peak (ms) P-T Amp (V) Norm P-T Amp Site1 Site2 Delta-P (ms) Dist (cm) Vel (m/s) Norm Vel (m/s)  Left Median Acr Palm Anti Sensory (2nd Digit)  30.6C  Wrist    *3.8 <3.6 *6.3 >10 Wrist Palm  0.0    Palm *NR  <2.0  6.6  2.9         Left Radial Anti Sensory (Base 1st Digit)  30.7C  Wrist    2.6 <3.1 28.7  Wrist Base 1st Digit 2.6 0.0    Left Ulnar Anti Sensory (5th Digit)  31.4C  Wrist    *6.4 <3.7 *14.5 >15.0 Wrist 5th Digit 6.4 14.0 *22 >38   Motor Summary Table   Stim Site NR Onset (ms) Norm Onset (ms) O-P Amp (mV) Norm O-P Amp Site1 Site2 Delta-0 (ms) Dist (cm) Vel (m/s) Norm Vel (m/s)  Left Median Motor  (Abd Poll Brev)  31C  Wrist    3.8 <4.2 6.1 >5 Elbow Wrist 5.1 25.0 *49 >50  Elbow    8.9  4.1         Left Ulnar Motor (Abd Dig Min)  31.2C  Wrist    3.4 <4.2 7.0 >3 B Elbow Wrist 4.6 25.0 54 >53  B Elbow    8.0  4.7  A Elbow B Elbow 1.7 10.5 62 >53  A Elbow    9.7  6.3          EMG   Side Muscle Nerve Root Ins Act Fibs Psw Amp Dur Poly Recrt Int Dennie Bible Comment  Left 1stDorInt Ulnar C8-T1 Nml Nml Nml Nml Nml 0 Nml Nml   Left Abd Poll Brev Median C8-T1 Nml Nml Nml Nml Nml 0 Nml Nml   Left ExtDigCom   Nml Nml Nml Nml Nml 0 Nml Nml   Left Triceps Radial C6-7-8 Nml Nml Nml Nml Nml 0 Nml Nml   Left Deltoid Axillary C5-6 Nml Nml Nml Nml Nml 0 Nml Nml     Nerve Conduction Studies Anti Sensory Left/Right Comparison   Stim Site L Lat (ms) R Lat (ms) L-R Lat (ms) L Amp (V) R Amp (V) L-R Amp (%) Site1 Site2 L Vel (m/s) R Vel (m/s) L-R Vel (m/s)  Median Acr Palm Anti Sensory (2nd Digit)  30.6C  Wrist *3.8   *6.3   Wrist Palm     Palm              6.6   2.9         Radial Anti Sensory (Base 1st Digit)  30.7C  Wrist 2.6   28.7   Wrist Base 1st Digit     Ulnar Anti Sensory (5th Digit)  31.4C  Wrist *6.4   *14.5   Wrist 5th Digit *22     Motor Left/Right Comparison   Stim Site L Lat (ms) R Lat (ms) L-R Lat (ms) L Amp (mV) R Amp (mV) L-R Amp (%) Site1 Site2 L Vel (m/s) R Vel (m/s) L-R Vel (m/s)  Median Motor (Abd Poll Brev)  31C  Wrist 3.8   6.1   Elbow Wrist *49    Elbow 8.9   4.1         Ulnar Motor (Abd Dig Min)  31.2C  Wrist 3.4   7.0   B Elbow Wrist 54    B Elbow 8.0   4.7   A Elbow B Elbow 62    A Elbow 9.7   6.3            Waveforms:             Clinical History: MRI CERVICAL SPINE WITHOUT CONTRAST  TECHNIQUE: Multiplanar, multisequence MR imaging of the cervical spine was performed. No intravenous contrast was administered.  COMPARISON:  None.  FINDINGS: Alignment: Trace degenerative listhesis.  Vertebrae:  There are chronic appearing degenerative  endplate marrow changes at C6 and C7. No substantial marrow edema. No suspicious osseous lesion.  Cord: No abnormal signal.  Posterior Fossa, vertebral arteries, paraspinal tissues: Unremarkable.  Disc levels:  C2-C3: Small central protrusion. Mild facet hypertrophy. Minor canal stenosis. No significant foraminal stenosis.  C3-C4: Small right foraminal disc osteophyte complex. No significant canal or left foraminal stenosis. Minor right foraminal stenosis.  C4-C5: Minimal disc bulge with endplate osteophytic ridging. No significant canal or left foraminal stenosis. Minor right foraminal stenosis.  C5-C6: Minimal disc bulge. No significant canal or foraminal stenosis.  C6-C7: Disc osteophyte complex and uncovertebral hypertrophy. No significant canal stenosis. Marked foraminal stenosis.  C7-T1: Disc osteophyte complex with possible superimposed left foraminal protrusion which. No significant canal stenosis. Moderate right and marked left foraminal stenosis.  IMPRESSION: Multilevel degenerative changes as detailed above. No high-grade canal stenosis. There is significant left foraminal stenosis at C6-C7 and C7-T1.   Electronically Signed   By: Guadlupe Spanish M.D.   On: 02/03/2020 20:24     Objective:  VS:  HT:    WT:   BMI:     BP:   HR: bpm  TEMP: ( )  RESP:  Physical Exam Musculoskeletal:        General: No tenderness.     Comments: Inspection reveals no atrophy of the left but some apparent atrophy of the right APB and FDI.   There is no swelling, color changes, allodynia or dystrophic changes. There is 5 out of 5 strength in the bilateral wrist extension, finger abduction and long finger flexion.  There is subjective impaired sensation globally on the left may be more in an ulnar nerve or C8 distribution.  Equivocal Benedictine sign. there is a negative Hoffmann's test bilaterally.  Skin:    General: Skin is warm and dry.     Findings: No erythema  or rash.  Neurological:     General: No focal deficit present.     Mental Status: He is alert and oriented to person, place, and time.     Sensory: No sensory deficit.     Motor: No weakness or abnormal muscle tone.     Coordination: Coordination normal.     Gait: Gait normal.  Psychiatric:        Mood and Affect: Mood normal.        Behavior: Behavior normal.        Thought Content: Thought content normal.     Ortho Exam Imaging: No results found.

## 2020-02-16 ENCOUNTER — Telehealth: Payer: Self-pay | Admitting: Orthopaedic Surgery

## 2020-02-16 NOTE — Telephone Encounter (Signed)
Patient called.  He is requesting a refill on his Oxycodone and is also wanting to be prescribed more prednisone.   Call back: [phone]

## 2020-02-19 ENCOUNTER — Other Ambulatory Visit: Payer: Self-pay | Admitting: Orthopaedic Surgery

## 2020-02-19 ENCOUNTER — Telehealth: Payer: Self-pay | Admitting: Orthopaedic Surgery

## 2020-02-19 MED ORDER — PREDNISONE 10 MG (21) PO TBPK: ORAL_TABLET | ORAL | 0 refills | Status: DC

## 2020-02-19 NOTE — Progress Notes (Unsigned)
Prednisone dose pack sent in .  °

## 2020-02-19 NOTE — Telephone Encounter (Signed)
Duplicate message in chart has been sent to Dr. Ophelia Charter to advise.

## 2020-02-19 NOTE — Telephone Encounter (Signed)
Please advise 

## 2020-02-19 NOTE — Procedures (Signed)
EMG & NCV Findings: Evaluation of the left median motor nerve showed decreased conduction velocity (Elbow-Wrist, 49 m/s).  The left median (across palm) sensory nerve showed no response (Palm), prolonged distal peak latency (3.8 ms), and reduced amplitude (6.3 V).  The left ulnar sensory nerve showed prolonged distal peak latency (6.4 ms), reduced amplitude (14.5 V), and decreased conduction velocity (Wrist-5th Digit, 22 m/s).  All remaining nerves (as indicated in the following tables) were within normal limits.    All examined muscles (as indicated in the following table) showed no evidence of electrical instability.    Impression: The above electrodiagnostic study is ABNORMAL and reveals evidence of a mild left median nerve entrapment at the wrist (carpal tunnel syndrome) affecting sensory components AND a mild left ulnar nerve entrapment at the wrist affecting sensory components.   There is no significant electrodiagnostic evidence of any other focal nerve entrapment, brachial plexopathy or cervical radiculopathy.   Recommendations: 1.  Follow-up with referring physician. 2.  Continue current management of symptoms.  ___________________________ Naaman Plummer FAAPMR Board Certified, American Board of Physical Medicine and Rehabilitation    Nerve Conduction Studies Anti Sensory Summary Table   Stim Site NR Peak (ms) Norm Peak (ms) P-T Amp (V) Norm P-T Amp Site1 Site2 Delta-P (ms) Dist (cm) Vel (m/s) Norm Vel (m/s)  Left Median Acr Palm Anti Sensory (2nd Digit)  30.6C  Wrist    *3.8 <3.6 *6.3 >10 Wrist Palm  0.0    Palm *NR  <2.0              6.6  2.9         Left Radial Anti Sensory (Base 1st Digit)  30.7C  Wrist    2.6 <3.1 28.7  Wrist Base 1st Digit 2.6 0.0    Left Ulnar Anti Sensory (5th Digit)  31.4C  Wrist    *6.4 <3.7 *14.5 >15.0 Wrist 5th Digit 6.4 14.0 *22 >38   Motor Summary Table   Stim Site NR Onset (ms) Norm Onset (ms) O-P Amp (mV) Norm O-P Amp Site1 Site2 Delta-0  (ms) Dist (cm) Vel (m/s) Norm Vel (m/s)  Left Median Motor (Abd Poll Brev)  31C  Wrist    3.8 <4.2 6.1 >5 Elbow Wrist 5.1 25.0 *49 >50  Elbow    8.9  4.1         Left Ulnar Motor (Abd Dig Min)  31.2C  Wrist    3.4 <4.2 7.0 >3 B Elbow Wrist 4.6 25.0 54 >53  B Elbow    8.0  4.7  A Elbow B Elbow 1.7 10.5 62 >53  A Elbow    9.7  6.3          EMG   Side Muscle Nerve Root Ins Act Fibs Psw Amp Dur Poly Recrt Int Dennie Bible Comment  Left 1stDorInt Ulnar C8-T1 Nml Nml Nml Nml Nml 0 Nml Nml   Left Abd Poll Brev Median C8-T1 Nml Nml Nml Nml Nml 0 Nml Nml   Left ExtDigCom   Nml Nml Nml Nml Nml 0 Nml Nml   Left Triceps Radial C6-7-8 Nml Nml Nml Nml Nml 0 Nml Nml   Left Deltoid Axillary C5-6 Nml Nml Nml Nml Nml 0 Nml Nml     Nerve Conduction Studies Anti Sensory Left/Right Comparison   Stim Site L Lat (ms) R Lat (ms) L-R Lat (ms) L Amp (V) R Amp (V) L-R Amp (%) Site1 Site2 L Vel (m/s) R Vel (m/s) L-R Vel (m/s)  Median  Acr Palm Anti Sensory (2nd Digit)  30.6C  Wrist *3.8   *6.3   Wrist Palm     Palm              6.6   2.9         Radial Anti Sensory (Base 1st Digit)  30.7C  Wrist 2.6   28.7   Wrist Base 1st Digit     Ulnar Anti Sensory (5th Digit)  31.4C  Wrist *6.4   *14.5   Wrist 5th Digit *22     Motor Left/Right Comparison   Stim Site L Lat (ms) R Lat (ms) L-R Lat (ms) L Amp (mV) R Amp (mV) L-R Amp (%) Site1 Site2 L Vel (m/s) R Vel (m/s) L-R Vel (m/s)  Median Motor (Abd Poll Brev)  31C  Wrist 3.8   6.1   Elbow Wrist *49    Elbow 8.9   4.1         Ulnar Motor (Abd Dig Min)  31.2C  Wrist 3.4   7.0   B Elbow Wrist 54    B Elbow 8.0   4.7   A Elbow B Elbow 62    A Elbow 9.7   6.3            Waveforms:

## 2020-02-19 NOTE — Telephone Encounter (Signed)
Patient called  Refer to last message. He is still waiting on the refill of his medications  Call back: [phone]

## 2020-02-19 NOTE — Telephone Encounter (Signed)
See if we can get him in earlier than Friday thanks

## 2020-02-20 ENCOUNTER — Telehealth: Payer: Self-pay | Admitting: Radiology

## 2020-02-20 ENCOUNTER — Ambulatory Visit (INDEPENDENT_AMBULATORY_CARE_PROVIDER_SITE_OTHER): Payer: Medicare PPO | Admitting: Orthopaedic Surgery

## 2020-02-20 ENCOUNTER — Encounter: Payer: Self-pay | Admitting: Orthopaedic Surgery

## 2020-02-20 ENCOUNTER — Other Ambulatory Visit: Payer: Self-pay

## 2020-02-20 ENCOUNTER — Telehealth: Payer: Self-pay | Admitting: *Deleted

## 2020-02-20 VITALS — BP 108/78 | HR 120 | Ht 71.0 in | Wt 170.0 lb

## 2020-02-20 DIAGNOSIS — M5412 Radiculopathy, cervical region: Secondary | ICD-10-CM | POA: Diagnosis not present

## 2020-02-20 DIAGNOSIS — M4722 Other spondylosis with radiculopathy, cervical region: Secondary | ICD-10-CM | POA: Diagnosis not present

## 2020-02-20 NOTE — Telephone Encounter (Signed)
I called patient and made work in appt for this afternoon.

## 2020-02-20 NOTE — Telephone Encounter (Signed)
Chelsea received call from Santel with the [facility].  He left number for call back. When I called this number, they asked for extension.  I do not have one. They informed me that most of the clinics are closed already. Can try again tomorrow. Patient states that he has not taken Plavix since 01/19/2020 and was in the ED there at the [facility] on 01/23/2020.  Patient is hoping to have Cervical ESI tomorrow but knows that we have to have clearance form completed and that he is supposed to be off of Plavix x 7 days. He was hoping that they would go ahead and send clearance form stating he can have procedure tomorrow as he has not taken the Plavix since March.    [phone]

## 2020-02-20 NOTE — Progress Notes (Signed)
Office Visit Note   Patient: William Baxter           Date of Birth: 10/19/1961           MRN: [id number] Visit Date: 02/20/2020              Requested by: No referring provider defined for this encounter. PCP: Patient, No Pcp Per   Assessment & Plan: Visit Diagnoses:  1. Radiculopathy, cervical region   2. Other spondylosis with radiculopathy, cervical region     Plan: With patient's ongoing symptoms and failed conservative treatment at this point recommend trying a diagnostic/therapeutic C7-T1 interlaminar injection.  He will follow-up with Dr. Ophelia Charter after completion to check his response.  If he gets good temporary relief we will decide at that point if we should schedule C6-7 and C7-T1 ACDF.  Patient can finish the prednisone taper that he started yesterday.  Per Dr. Alvester Morin patient will need clearance from cardiology be off Plavix 1 week before injection.  Follow-Up Instructions: Return in about 3 weeks (around 03/12/2020) for With Dr. Ophelia Charter after cervical ESI.   Orders:  Orders Placed This Encounter  Procedures  . Ambulatory referral to Physical Medicine Rehab   No orders of the defined types were placed in this encounter.     Procedures: No procedures performed   Clinical Data: No additional findings.   Subjective: Chief Complaint  Patient presents with  . Left Arm - Follow-up, Pain    HPI 59 year old black male returns.  He continues to have worsening neck pain and left upper extremity radiculopathy.  Pain radiates from the left side of his neck down into the shoulder and left scapula.  No symptoms on the right side.  Bending his head to the left reproduces pain radiating down his arm.  Feels like he has some left arm weakness.  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  The study was done by Dr. Alvester Morin on February 15, 2020.   Objective: Vital Signs: BP 108/78    Pulse (!) 120   Ht 5\' 11"  (1.803 m)   Wt 170 lb (77.1 kg)   BMI 23.71 kg/m   Physical Exam HENT:     Head: Normocephalic.  Eyes:     Extraocular Movements: Extraocular movements intact.     Pupils: Pupils are equal, round, and reactive to light.  Musculoskeletal:     Comments: Decreased C-spine range of motion due to pain on the left.  Positive left Spurling test.  Positive left brachial plexus trapezius and scapular tenderness.  Negative on the right side.  Bilateral shoulder negative impingement test.  Bilateral elbows good range of motion.  Negative Tinel's over the bilateral cubital tunnels.  He does have trace left triceps and biceps weakness with resistance.  No right upper extremity weakness.  Skin:    General: Skin is warm and dry.  Neurological:     Mental Status: He is alert and oriented to person, place, and time.  Psychiatric:        Mood and Affect: Mood normal.     Ortho Exam  Specialty Comments:  No specialty comments available.  Imaging: No results found.   PMFS History: Patient Active Problem List   Diagnosis Date Noted  . Radiculopathy, cervical region 02/05/2020  . Chest pain with moderate risk for cardiac etiology 06/14/2016  . Chest pain, moderate coronary artery risk 06/14/2016  .  Headache   . Acute coronary syndrome ([facility]) 12/16/2013   Past Medical History:  Diagnosis Date  . BPH (benign prostatic hyperplasia)   . Heart disease   . Medical history non-contributory   . Migraine   . Myocardial infarction acute (HCC)     No family history on file.  Past Surgical History:  Procedure Laterality Date  . CARDIAC CATHETERIZATION     Social History   Occupational History  . Not on file  Tobacco Use  . Smoking status: Current Every Day Smoker    Packs/day: 0.00    Types: Cigarettes  . Smokeless tobacco: Never Used  Substance and Sexual Activity  . Alcohol use: Yes    Comment: occ  . Drug use: No  . Sexual activity: Not on file

## 2020-02-21 ENCOUNTER — Telehealth: Payer: Self-pay | Admitting: Orthopaedic Surgery

## 2020-02-21 NOTE — Telephone Encounter (Signed)
OK - thanks

## 2020-02-21 NOTE — Telephone Encounter (Signed)
Patient called. Says the medication Dr. Ophelia Charter asked about, he does not take it. His call back number is [phone]

## 2020-02-21 NOTE — Telephone Encounter (Signed)
Unable to reach [name].  Awaiting fax for clearance from [place].

## 2020-02-21 NOTE — Telephone Encounter (Signed)
FYI. I am not sure what medication he is talking about.  If it is in regards to the Plavix, he told me that he has not taken it since 3/19 but we still have to have clearance from cardiologist at the [facility] prior to him being scheduled for [facility]. This has been discussed with patient.

## 2020-02-21 NOTE — Telephone Encounter (Signed)
Patient has been calling Cassandra in our office re. an appointment today with Dr. Alvester Morin. I did discuss with Dr. Alvester Morin that it looks like the patient has been holding his Plavix. Per Dr. Alvester Morin, if the patient really has been off his Plavix [facility] might be able to work him in faster than we can. Please advise.

## 2020-02-21 NOTE — Telephone Encounter (Signed)
Patient called advised the paper from the [place] was faxed over at 1:00pm today. Patient said he was suppose to get the injection today but was told the paperwork was not received. Patient said he has been in the bed for a month now and can not wait a month for the injection.   Patient asked for a call back as soon as possible. The number to contact patient is [phone]

## 2020-02-21 NOTE — Telephone Encounter (Signed)
We are still awaiting clearance from [place].

## 2020-02-22 NOTE — Telephone Encounter (Signed)
This was received yesterday and given to [facility] team.

## 2020-02-22 NOTE — Telephone Encounter (Signed)
Could you please let me know when you receive paperwork from the [place]? It is supposed to be a clearance in regards to his Plavix. Thanks.

## 2020-02-22 NOTE — Telephone Encounter (Signed)
The form I received yesterday was from Dr. Judie Grieve and it states that the patient is receiving Plavix from the [place] not him. He did not ok holding it since the last time they saw him was 06/22/2019.

## 2020-02-23 ENCOUNTER — Ambulatory Visit: Payer: No Typology Code available for payment source | Admitting: Orthopaedic Surgery

## 2020-02-23 NOTE — Telephone Encounter (Signed)
Noted. Patient has been made aware we cannot schedule injection without clearance from cardiologist at [facility]. Still awaiting paperwork that states ok to proceed with injection due to Plavix.

## 2020-03-04 ENCOUNTER — Encounter (HOSPITAL_COMMUNITY): Payer: Self-pay

## 2020-03-04 ENCOUNTER — Other Ambulatory Visit: Payer: Self-pay

## 2020-03-04 ENCOUNTER — Emergency Department (HOSPITAL_COMMUNITY)
Admission: EM | Admit: 2020-03-04 | Discharge: 2020-03-04 | Disposition: A | Discharge status: Home / Self Care | Payer: No Typology Code available for payment source | Attending: Emergency Medicine | Admitting: Emergency Medicine

## 2020-03-04 DIAGNOSIS — Y929 Unspecified place or not applicable: Secondary | ICD-10-CM | POA: Diagnosis not present

## 2020-03-04 DIAGNOSIS — S41111A Laceration without foreign body of right upper arm, initial encounter: Secondary | ICD-10-CM

## 2020-03-04 DIAGNOSIS — W19XXXA Unspecified fall, initial encounter: Secondary | ICD-10-CM | POA: Diagnosis not present

## 2020-03-04 DIAGNOSIS — Z23 Encounter for immunization: Secondary | ICD-10-CM | POA: Insufficient documentation

## 2020-03-04 DIAGNOSIS — Z79899 Other long term (current) drug therapy: Secondary | ICD-10-CM | POA: Insufficient documentation

## 2020-03-04 DIAGNOSIS — Y999 Unspecified external cause status: Secondary | ICD-10-CM | POA: Insufficient documentation

## 2020-03-04 DIAGNOSIS — Z7982 Long term (current) use of aspirin: Secondary | ICD-10-CM | POA: Insufficient documentation

## 2020-03-04 DIAGNOSIS — Y939 Activity, unspecified: Secondary | ICD-10-CM | POA: Insufficient documentation

## 2020-03-04 DIAGNOSIS — Z7901 Long term (current) use of anticoagulants: Secondary | ICD-10-CM | POA: Diagnosis not present

## 2020-03-04 DIAGNOSIS — F17210 Nicotine dependence, cigarettes, uncomplicated: Secondary | ICD-10-CM | POA: Insufficient documentation

## 2020-03-04 MED ORDER — TETANUS-DIPHTH-ACELL PERTUSSIS 5-2.5-18.5 LF-MCG/0.5 IM SUSP
0.5000 mL | Freq: Once | INTRAMUSCULAR | Status: AC
  Administered 2020-03-04: 18:00:00 0.5 mL via INTRAMUSCULAR
  Filled 2020-03-04: qty 0.5

## 2020-03-04 NOTE — Discharge Instructions (Signed)
Please follow up with your primary care provider within 5-7 days for re-evaluation of your symptoms. If you do not have a primary care provider, information for a healthcare clinic has been provided for you to make arrangements for follow up care. Please return to the emergency department for any new or worsening symptoms. ° °

## 2020-03-04 NOTE — ED Triage Notes (Signed)
Pt has laceration to posterior right forearm that occurred Saturday night from a fall.

## 2020-03-04 NOTE — ED Provider Notes (Signed)
[facility] EMERGENCY DEPARTMENT Provider Note   CSN: [id number] Arrival date & time: 03/04/20  1407     History Chief Complaint  Patient presents with  . Extremity Laceration    William Baxter is a 59 y.o. male.  HPI   59 year old male with history of BPH, heart disease, migraine, MI, who presents the emergency department today for evaluation of a laceration to the right forearm.  Patient states that he was walking up some stairs 2 days ago and slipped falling onto his right upper extremity.  He denies any head trauma or LOC.  At this time he denies any pain to his body from the fall, he has mostly concerned about the laceration to his right upper extremity.  He is not sure when his last Tdap was.  Past Medical History:  Diagnosis Date  . BPH (benign prostatic hyperplasia)   . Heart disease   . Medical history non-contributory   . Migraine   . Myocardial infarction acute [facility])     Patient Active Problem List   Diagnosis Date Noted  . Radiculopathy, cervical region 02/05/2020  . Chest pain with moderate risk for cardiac etiology 06/14/2016  . Chest pain, moderate coronary artery risk 06/14/2016  . Headache   . Acute coronary syndrome ([place]) 12/16/2013    Past Surgical History:  Procedure Laterality Date  . CARDIAC CATHETERIZATION         No family history on file.  Social History   Tobacco Use  . Smoking status: Current Every Day Smoker    Packs/day: 0.00    Types: Cigarettes  . Smokeless tobacco: Never Used  Substance Use Topics  . Alcohol use: Yes    Comment: occ  . Drug use: No    Home Medications Prior to Admission medications   Medication Sig Start Date End Date Taking? Authorizing Provider  acetaminophen (TYLENOL) 325 MG tablet Take 325 mg by mouth every 6 (six) hours as needed for mild pain.     [provider]  albuterol (PROVENTIL HFA;VENTOLIN HFA) 108 (90 Base) MCG/ACT inhaler Inhale 2 puffs into the lungs every 6  (six) hours as needed for wheezing or shortness of breath. 06/15/16   Geradine Girt, DO  aspirin EC 81 MG EC tablet Take 1 tablet (81 mg total) by mouth daily. 12/19/13   Dixie Dials, MD  atorvastatin (LIPITOR) 80 MG tablet Take 1 tablet (80 mg total) by mouth daily at 6 PM. 12/19/13   Dixie Dials, MD  ciprofloxacin (CIPRO) 500 MG tablet Take 1 tablet (500 mg total) by mouth 2 (two) times daily. 09/11/19   Eustaquio Maize, PA-C  clopidogrel (PLAVIX) 75 MG tablet Take 1 tablet (75 mg total) by mouth daily with breakfast. 12/19/13   Dixie Dials, MD  cyclobenzaprine (FLEXERIL) 10 MG tablet Take 1 tablet (10 mg total) by mouth 2 (two) times daily as needed for muscle spasms. Patient not taking: Reported on 02/06/2020 01/19/20   Dorie Rank, MD  etodolac (LODINE) 300 MG capsule Take 1 capsule (300 mg total) by mouth every 8 (eight) hours. Patient not taking: Reported on 02/06/2020 01/19/20   Dorie Rank, MD  isosorbide mononitrate (IMDUR) 60 MG 24 hr tablet Take 60 mg by mouth daily.    [provider]  metoprolol (LOPRESSOR) 25 MG tablet Take 1 tablet (25 mg total) by mouth 2 (two) times daily. 06/15/16   Geradine Girt, DO  metroNIDAZOLE (FLAGYL) 500 MG tablet Take 1 tablet (500 mg total)  by mouth 2 (two) times daily. 09/11/19   Hyman Hopes, Margaux, PA-C  nitroGLYCERIN (NITROSTAT) 0.4 MG SL tablet Place 1 tablet (0.4 mg total) under the tongue every 5 (five) minutes as needed for chest pain. 12/19/13   Orpah Cobb, MD  oxyCODONE-acetaminophen (PERCOCET/ROXICET) 5-325 MG tablet Take 1 tablet by mouth every 6 (six) hours as needed for severe pain. Patient not taking: Reported on 02/20/2020 02/06/20   Eldred Manges, MD  permethrin (ELIMITE) 5 % cream Apply Head to toe and wash off after 8 hours 03/11/17   Lawyer, Cristal Deer, PA-C  polyvinyl alcohol (LIQUIFILM TEARS) 1.4 % ophthalmic solution Place 1 drop into both eyes as needed for dry eyes.    [provider]  predniSONE (STERAPRED UNI-PAK 21 TAB)  10 MG (21) TBPK tablet Take as instructed, 6,5,4,3,2,1 tablets daily then stop . 02/19/20   Eldred Manges, MD  tamsulosin (FLOMAX) 0.4 MG CAPS capsule Take 0.4 mg by mouth daily.    [provider]    Allergies    Penicillins  Review of Systems   Review of Systems  Constitutional: Negative for fever.  Respiratory: Negative for shortness of breath.   Cardiovascular: Negative for chest pain.  Gastrointestinal: Negative for abdominal pain and diarrhea.  Musculoskeletal: Negative for back pain.       Right arm wound  Skin: Positive for wound.  Neurological: Negative for headaches.    Physical Exam Updated Vital Signs BP 125/83   Pulse 96   Temp 98.5 F (36.9 C) (Oral)   Resp 18   Ht 5\' 11"  (1.803 m)   Wt 77.1 kg   SpO2 100%   BMI 23.71 kg/m   Physical Exam Constitutional:      General: He is not in acute distress.    Appearance: He is well-developed.  Eyes:     Conjunctiva/sclera: Conjunctivae normal.  Cardiovascular:     Rate and Rhythm: Normal rate.  Pulmonary:     Effort: Pulmonary effort is normal.  Musculoskeletal:     Comments: 2 cm laceration to the right forearm that is well-healing.  There is no bony tenderness of the radius/ulna.  Normal range of motion of bilateral upper extremities.  Skin:    General: Skin is warm and dry.  Neurological:     Mental Status: He is alert and oriented to person, place, and time.     ED Results / Procedures / Treatments   Labs (all labs ordered are listed, but only abnormal results are displayed) Labs Reviewed - No data to display  EKG None  Radiology No results found.  Procedures Procedures (including critical care time)  Medications Ordered in ED Medications  Tdap (BOOSTRIX) injection 0.5 mL (has no administration in time range)    ED Course  I have reviewed the triage vital signs and the nursing notes.  Pertinent labs & imaging results that were available during my care of the patient were  reviewed by me and considered in my medical decision making (see chart for details).    MDM Rules/Calculators/A&P                      59 year old male presenting for evaluation of laceration to the right upper extremity that occurred 2 days ago after a mechanical fall.  He denies any other injuries from the fall.  He not have any head trauma or LOC.  He does not have any bony tenderness across the forearm therefore low suspicion for fracture.  Laceration is too old to be repaired.  Wound care provided.  Tdap updated.  Advised on wound care at home.  Advised on follow-up and return precautions.  He voiced understanding of the plan and reasons to return.  All questions answered.  Patient stable for discharge.  Final Clinical Impression(s) / ED Diagnoses Final diagnoses:  Laceration of right upper extremity, initial encounter    Rx / DC Orders ED Discharge Orders    None       Karrie Meres, PA-C 03/04/20 1717    Melene Plan, DO 03/04/20 1722

## 2020-05-17 ENCOUNTER — Telehealth: Payer: Self-pay

## 2020-05-17 NOTE — Telephone Encounter (Signed)
Was this one you had called?

## 2020-05-17 NOTE — Telephone Encounter (Signed)
Patient was returning a call.  Please advise.  Thank you

## 2020-05-21 NOTE — Telephone Encounter (Signed)
Called pt and lvm #2 

## 2020-08-18 ENCOUNTER — Encounter (HOSPITAL_COMMUNITY): Payer: Self-pay | Admitting: Emergency Medicine

## 2020-08-18 ENCOUNTER — Other Ambulatory Visit: Payer: Self-pay

## 2020-08-18 ENCOUNTER — Emergency Department (HOSPITAL_COMMUNITY)
Admission: EM | Admit: 2020-08-18 | Discharge: 2020-08-18 | Disposition: A | Discharge status: Home / Self Care | Payer: No Typology Code available for payment source | Attending: Emergency Medicine | Admitting: Emergency Medicine

## 2020-08-18 DIAGNOSIS — M79602 Pain in left arm: Secondary | ICD-10-CM | POA: Diagnosis present

## 2020-08-18 DIAGNOSIS — Z7902 Long term (current) use of antithrombotics/antiplatelets: Secondary | ICD-10-CM | POA: Diagnosis not present

## 2020-08-18 DIAGNOSIS — Z7982 Long term (current) use of aspirin: Secondary | ICD-10-CM | POA: Diagnosis not present

## 2020-08-18 DIAGNOSIS — F17210 Nicotine dependence, cigarettes, uncomplicated: Secondary | ICD-10-CM | POA: Diagnosis not present

## 2020-08-18 DIAGNOSIS — M5412 Radiculopathy, cervical region: Secondary | ICD-10-CM

## 2020-08-18 MED ORDER — IBUPROFEN 800 MG PO TABS
800.0000 mg | ORAL_TABLET | Freq: Once | ORAL | Status: AC
  Administered 2020-08-18: 800 mg via ORAL
  Filled 2020-08-18: qty 1

## 2020-08-18 NOTE — ED Notes (Signed)
Pt requesting arm sling and medicine that will stop the pain.

## 2020-08-18 NOTE — ED Triage Notes (Signed)
Pt reports L shoulder pain and L arm pain since Friday.  Pain worse with movement.  States he was seen for same in March and diagnosed with pinched nerve in back.

## 2020-08-18 NOTE — ED Provider Notes (Signed)
[facility] EMERGENCY DEPARTMENT Provider Note   CSN: [id number] Arrival date & time: 08/18/20  1520     History Chief Complaint  Patient presents with  . Shoulder Pain  . Arm Pain    William Baxter is a 59 y.o. male.  HPI 59 year old male presents today complaining of left arm pain.  He states that he has previously been diagnosed with cervical spine problems and pain into his left arm.  He is being followed at the [facility].  He states that he is on Neurontin and skeletal muscle relaxants.  Not take this today due to it not relieving the pain but making him sleepy.  He states that he had to drive.  He stopped here on his way home because of the pain.  Pain has been increasing over several days.  [facility]  He denies any numbness weakness or tingling.    Past Medical History:  Diagnosis Date  . BPH (benign prostatic hyperplasia)   . Heart disease   . Medical history non-contributory   . Migraine   . Myocardial infarction acute [facility])     Patient Active Problem List   Diagnosis Date Noted  . Radiculopathy, cervical region 02/05/2020  . Chest pain with moderate risk for cardiac etiology 06/14/2016  . Chest pain, moderate coronary artery risk 06/14/2016  . Headache   . Acute coronary syndrome ([facility]) 12/16/2013    Past Surgical History:  Procedure Laterality Date  . CARDIAC CATHETERIZATION         No family history on file.  Social History   Tobacco Use  . Smoking status: Current Every Day Smoker    Packs/day: 0.00    Types: Cigarettes  . Smokeless tobacco: Never Used  Substance Use Topics  . Alcohol use: Yes    Comment: occ  . Drug use: No    Home Medications Prior to Admission medications   Medication Sig Start Date End Date Taking? Authorizing Provider  acetaminophen (TYLENOL) 325 MG tablet Take 325 mg by mouth every 6 (six) hours as needed for mild pain.     [provider]  albuterol  (PROVENTIL HFA;VENTOLIN HFA) 108 (90 Base) MCG/ACT inhaler Inhale 2 puffs into the lungs every 6 (six) hours as needed for wheezing or shortness of breath. 06/15/16   Joseph Art, DO  aspirin EC 81 MG EC tablet Take 1 tablet (81 mg total) by mouth daily. 12/19/13   Orpah Cobb, MD  atorvastatin (LIPITOR) 80 MG tablet Take 1 tablet (80 mg total) by mouth daily at 6 PM. 12/19/13   Orpah Cobb, MD  ciprofloxacin (CIPRO) 500 MG tablet Take 1 tablet (500 mg total) by mouth 2 (two) times daily. 09/11/19   Tanda Rockers, PA-C  clopidogrel (PLAVIX) 75 MG tablet Take 1 tablet (75 mg total) by mouth daily with breakfast. 12/19/13   Orpah Cobb, MD  cyclobenzaprine (FLEXERIL) 10 MG tablet Take 1 tablet (10 mg total) by mouth 2 (two) times daily as needed for muscle spasms. 01/19/20   Linwood Dibbles, MD  etodolac (LODINE) 300 MG capsule Take 1 capsule (300 mg total) by mouth every 8 (eight) hours. 01/19/20   Linwood Dibbles, MD  isosorbide mononitrate (IMDUR) 60 MG 24 hr tablet Take 60 mg by mouth daily.    [provider]  metoprolol (LOPRESSOR) 25 MG tablet Take 1 tablet (25 mg total) by mouth 2 (two) times daily. 06/15/16   Joseph Art, DO  metroNIDAZOLE (FLAGYL) 500 MG tablet Take 1 tablet (500 mg total) by mouth 2 (two) times daily. 09/11/19   Hyman Hopes, Margaux, PA-C  nitroGLYCERIN (NITROSTAT) 0.4 MG SL tablet Place 1 tablet (0.4 mg total) under the tongue every 5 (five) minutes as needed for chest pain. 12/19/13   Orpah Cobb, MD  oxyCODONE-acetaminophen (PERCOCET/ROXICET) 5-325 MG tablet Take 1 tablet by mouth every 6 (six) hours as needed for severe pain. 02/06/20   Eldred Manges, MD  permethrin (ELIMITE) 5 % cream Apply Head to toe and wash off after 8 hours 03/11/17   Lawyer, Cristal Deer, PA-C  polyvinyl alcohol (LIQUIFILM TEARS) 1.4 % ophthalmic solution Place 1 drop into both eyes as needed for dry eyes.    [provider]  predniSONE (STERAPRED UNI-PAK 21 TAB) 10 MG (21) TBPK tablet Take  as instructed, 6,5,4,3,2,1 tablets daily then stop . 02/19/20   Eldred Manges, MD  tamsulosin (FLOMAX) 0.4 MG CAPS capsule Take 0.4 mg by mouth daily.    [provider]    Allergies    Penicillins  Review of Systems   Review of Systems  All other systems reviewed and are negative.   Physical Exam Updated Vital Signs BP 97/61 (BP Location: Left Arm)   Pulse 88   Temp 98.3 F (36.8 C) (Oral)   Resp 16   Ht 1.702 m (5\' 7" )   Wt 77 kg   SpO2 100%   BMI 26.59 kg/m   Physical Exam Vitals reviewed.  Constitutional:      Appearance: Normal appearance.  HENT:     Head: Normocephalic.     Right Ear: External ear normal.     Left Ear: External ear normal.     Nose: Nose normal.  Eyes:     Pupils: Pupils are equal, round, and reactive to light.  Cardiovascular:     Rate and Rhythm: Normal rate.  Musculoskeletal:        General: Normal range of motion.     Cervical back: Normal range of motion. Tenderness present. No rigidity.     Comments: Tenderness palpation diffusely of her neck, shoulder, and entire arm. There is no redness, swelling, or extremities.  There is no warmth present. Pulses are intact Sensation is intact Full active range of motion of wrist, elbow, shoulder  Skin:    General: Skin is warm.     Capillary Refill: Capillary refill takes less than 2 seconds.  Neurological:     General: No focal deficit present.     Mental Status: He is alert.     Cranial Nerves: No cranial nerve deficit.     Sensory: No sensory deficit.     Motor: No weakness.     ED Results / Procedures / Treatments   Labs (all labs ordered are listed, but only abnormal results are displayed) Labs Reviewed - No data to display  EKG None  Radiology No results found.  Procedures Procedures (including critical care time)  Medications Ordered in ED Medications - No data to display  ED Course  I have reviewed the triage vital signs and the nursing notes.  Pertinent  labs & imaging results that were available during my care of the patient were reviewed by me and considered in my medical decision making (see chart for details).    MDM Rules/Calculators/A&P                          Patient with increased  pain from neck into left arm.  I see no signs of infection.  Will be given anti-inflammatory here in ED.  [facility] Final Clinical Impression(s) / ED Diagnoses Final diagnoses:  Cervical radiculopathy    Rx / DC Orders ED Discharge Orders    None       Margarita Grizzle, MD 08/18/20 1725

## 2020-09-29 ENCOUNTER — Other Ambulatory Visit: Payer: Self-pay

## 2020-09-29 ENCOUNTER — Encounter (HOSPITAL_COMMUNITY): Payer: Self-pay | Admitting: Emergency Medicine

## 2020-09-29 ENCOUNTER — Emergency Department (HOSPITAL_COMMUNITY)
Admission: EM | Admit: 2020-09-29 | Discharge: 2020-09-29 | Disposition: A | Discharge status: Home / Self Care | Payer: No Typology Code available for payment source | Attending: Emergency Medicine | Admitting: Emergency Medicine

## 2020-09-29 DIAGNOSIS — Z5321 Procedure and treatment not carried out due to patient leaving prior to being seen by health care provider: Secondary | ICD-10-CM | POA: Insufficient documentation

## 2020-09-29 DIAGNOSIS — G8929 Other chronic pain: Secondary | ICD-10-CM | POA: Diagnosis not present

## 2020-09-29 DIAGNOSIS — M542 Cervicalgia: Secondary | ICD-10-CM | POA: Insufficient documentation

## 2020-09-29 NOTE — ED Triage Notes (Signed)
C/o chronic neck pain that has been flared up x 3 weeks with radiation down L arm.

## 2020-09-29 NOTE — ED Notes (Signed)
Lwbs. 

## 2021-07-06 ENCOUNTER — Encounter (HOSPITAL_COMMUNITY): Payer: Self-pay | Admitting: *Deleted

## 2021-07-06 ENCOUNTER — Emergency Department (HOSPITAL_COMMUNITY)
Admission: EM | Admit: 2021-07-06 | Discharge: 2021-07-06 | Disposition: A | Discharge status: Home / Self Care | Payer: No Typology Code available for payment source | Attending: Emergency Medicine | Admitting: Emergency Medicine

## 2021-07-06 ENCOUNTER — Other Ambulatory Visit: Payer: Self-pay

## 2021-07-06 DIAGNOSIS — J029 Acute pharyngitis, unspecified: Secondary | ICD-10-CM | POA: Diagnosis present

## 2021-07-06 DIAGNOSIS — Z7982 Long term (current) use of aspirin: Secondary | ICD-10-CM | POA: Diagnosis not present

## 2021-07-06 DIAGNOSIS — F17210 Nicotine dependence, cigarettes, uncomplicated: Secondary | ICD-10-CM | POA: Insufficient documentation

## 2021-07-06 DIAGNOSIS — J069 Acute upper respiratory infection, unspecified: Secondary | ICD-10-CM | POA: Diagnosis not present

## 2021-07-06 DIAGNOSIS — Z20822 Contact with and (suspected) exposure to covid-19: Secondary | ICD-10-CM | POA: Diagnosis not present

## 2021-07-06 DIAGNOSIS — Z7902 Long term (current) use of antithrombotics/antiplatelets: Secondary | ICD-10-CM | POA: Insufficient documentation

## 2021-07-06 NOTE — ED Notes (Signed)
E-signature pad unavailable at time of pt discharge. This RN discussed discharge materials with pt and answered all pt questions. Pt stated understanding of discharge material. ? ?

## 2021-07-06 NOTE — ED Provider Notes (Signed)
[facility] EMERGENCY DEPARTMENT Provider Note   CSN: [id number] Arrival date & time: 07/06/21  1850     History Chief Complaint  Patient presents with   Fatigue    William Baxter is a 60 y.o. male presents with chief complaint of COVID-19 exposure and viral-like illness.  Presents with nasal congestion, rhinorrhea, sore throat, cough, decreased appetite and chills.  Patient reports that his symptoms started yesterday.  Symptoms have been constant.  Cough is nonproductive.  Patient denies trying any modalities to alleviate his symptoms.  Patient reports he had close contact with individuals who are positive for COVID-19.  Patient has been vaccinated for COVID-19 and received booster.    HPI     Past Medical History:  Diagnosis Date   BPH (benign prostatic hyperplasia)    Heart disease    Medical history non-contributory    Migraine    Myocardial infarction acute [facility])     Patient Active Problem List   Diagnosis Date Noted   Radiculopathy, cervical region 02/05/2020   Chest pain with moderate risk for cardiac etiology 06/14/2016   Chest pain, moderate coronary artery risk 06/14/2016   Headache    Acute coronary syndrome (HCC) 12/16/2013    Past Surgical History:  Procedure Laterality Date   CARDIAC CATHETERIZATION         No family history on file.  Social History   Tobacco Use   Smoking status: Every Day    Packs/day: 0.00    Types: Cigarettes   Smokeless tobacco: Never  Substance Use Topics   Alcohol use: Yes    Comment: occ   Drug use: No    Home Medications Prior to Admission medications   Medication Sig Start Date End Date Taking? Authorizing Provider  acetaminophen (TYLENOL) 325 MG tablet Take 325 mg by mouth every 6 (six) hours as needed for mild pain.     [provider]  albuterol (PROVENTIL HFA;VENTOLIN HFA) 108 (90 Base) MCG/ACT inhaler Inhale 2 puffs into the lungs every 6 (six) hours as needed for wheezing or  shortness of breath. 06/15/16   Joseph Art, DO  aspirin EC 81 MG EC tablet Take 1 tablet (81 mg total) by mouth daily. 12/19/13   Orpah Cobb, MD  atorvastatin (LIPITOR) 80 MG tablet Take 1 tablet (80 mg total) by mouth daily at 6 PM. 12/19/13   Orpah Cobb, MD  ciprofloxacin (CIPRO) 500 MG tablet Take 1 tablet (500 mg total) by mouth 2 (two) times daily. 09/11/19   Tanda Rockers, PA-C  clopidogrel (PLAVIX) 75 MG tablet Take 1 tablet (75 mg total) by mouth daily with breakfast. 12/19/13   Orpah Cobb, MD  cyclobenzaprine (FLEXERIL) 10 MG tablet Take 1 tablet (10 mg total) by mouth 2 (two) times daily as needed for muscle spasms. 01/19/20   Linwood Dibbles, MD  etodolac (LODINE) 300 MG capsule Take 1 capsule (300 mg total) by mouth every 8 (eight) hours. 01/19/20   Linwood Dibbles, MD  isosorbide mononitrate (IMDUR) 60 MG 24 hr tablet Take 60 mg by mouth daily.    [provider]  metoprolol (LOPRESSOR) 25 MG tablet Take 1 tablet (25 mg total) by mouth 2 (two) times daily. 06/15/16   Joseph Art, DO  metroNIDAZOLE (FLAGYL) 500 MG tablet Take 1 tablet (500 mg total) by mouth 2 (two) times daily. 09/11/19   Hyman Hopes, Margaux, PA-C  nitroGLYCERIN (NITROSTAT) 0.4 MG SL tablet Place 1 tablet (0.4 mg total) under the tongue every  5 (five) minutes as needed for chest pain. 12/19/13   Orpah Cobb, MD  oxyCODONE-acetaminophen (PERCOCET/ROXICET) 5-325 MG tablet Take 1 tablet by mouth every 6 (six) hours as needed for severe pain. 02/06/20   Eldred Manges, MD  permethrin (ELIMITE) 5 % cream Apply Head to toe and wash off after 8 hours 03/11/17   Lawyer, Cristal Deer, PA-C  polyvinyl alcohol (LIQUIFILM TEARS) 1.4 % ophthalmic solution Place 1 drop into both eyes as needed for dry eyes.    [provider]  predniSONE (STERAPRED UNI-PAK 21 TAB) 10 MG (21) TBPK tablet Take as instructed, 6,5,4,3,2,1 tablets daily then stop . 02/19/20   Eldred Manges, MD  tamsulosin (FLOMAX) 0.4 MG CAPS capsule Take 0.4 mg  by mouth daily.    [provider]    Allergies    Penicillins  Review of Systems   Review of Systems  Constitutional:  Positive for chills. Negative for fever.  HENT:  Positive for congestion, rhinorrhea and sore throat. Negative for drooling, trouble swallowing and voice change.   Eyes:  Negative for visual disturbance.  Respiratory:  Positive for cough. Negative for shortness of breath.   Cardiovascular:  Negative for chest pain and leg swelling.  Gastrointestinal:  Negative for abdominal pain, diarrhea, nausea and vomiting.  Genitourinary:  Negative for difficulty urinating, dysuria, frequency, hematuria, penile discharge, penile pain, penile swelling, scrotal swelling and testicular pain.  Musculoskeletal:  Negative for back pain, neck pain and neck stiffness.  Skin:  Negative for color change and rash.  Neurological:  Negative for dizziness, syncope, light-headedness and headaches.  Psychiatric/Behavioral:  Negative for confusion.    Physical Exam Updated Vital Signs BP (!) 147/96   Pulse 84   Temp 98.8 F (37.1 C) (Oral)   Resp 16   SpO2 99%   Physical Exam Vitals and nursing note reviewed.  Constitutional:      General: He is not in acute distress.    Appearance: He is not ill-appearing, toxic-appearing or diaphoretic.  HENT:     Head: Normocephalic. No right periorbital erythema or left periorbital erythema.     Jaw: There is normal jaw occlusion. No trismus, tenderness, swelling, pain on movement or malocclusion.     Mouth/Throat:     Lips: Pink. No lesions.     Mouth: Mucous membranes are moist. No injury, oral lesions or angioedema.     Tongue: No lesions. Tongue does not deviate from midline.     Palate: No mass and lesions.     Pharynx: Oropharynx is clear. Uvula midline. No pharyngeal swelling, oropharyngeal exudate, posterior oropharyngeal erythema or uvula swelling.     Tonsils: No tonsillar exudate or tonsillar abscesses. 1+ on the right. 1+ on  the left.     Comments: No swelling to oral mucosa.  No submandibular swelling.  Patient handles oral secretions without difficulty. Eyes:     General: No scleral icterus.       Right eye: No discharge.        Left eye: No discharge.  Cardiovascular:     Rate and Rhythm: Normal rate.  Pulmonary:     Effort: Pulmonary effort is normal. No tachypnea, bradypnea or respiratory distress.     Breath sounds: Normal breath sounds. No stridor.  Abdominal:     General: Abdomen is protuberant. Bowel sounds are normal. There is no distension. There are no signs of injury.     Palpations: Abdomen is soft. There is no mass or pulsatile mass.  Tenderness: There is no abdominal tenderness. There is no guarding or rebound.     Hernia: There is no hernia in the umbilical area or ventral area.  Musculoskeletal:     Cervical back: Full passive range of motion without pain, normal range of motion and neck supple. No edema, erythema, signs of trauma, rigidity, torticollis or crepitus. No pain with movement, spinous process tenderness or muscular tenderness. Normal range of motion.     Right lower leg: Normal.     Left lower leg: Normal.  Lymphadenopathy:     Cervical: No cervical adenopathy.  Skin:    General: Skin is warm and dry.  Neurological:     General: No focal deficit present.     Mental Status: He is alert.  Psychiatric:        Behavior: Behavior is cooperative.    ED Results / Procedures / Treatments   Labs (all labs ordered are listed, but only abnormal results are displayed) Labs Reviewed  SARS CORONAVIRUS 2 (TAT 6-24 HRS)    EKG None  Radiology No results found.  Procedures Procedures   Medications Ordered in ED Medications - No data to display  ED Course  I have reviewed the triage vital signs and the nursing notes.  Pertinent labs & imaging results that were available during my care of the patient were reviewed by me and considered in my medical decision making (see  chart for details).    MDM Rules/Calculators/A&P                           Alert 60 year old male no acute distress, nontoxic appearing.  Presents with viral-like illness symptoms and COVID-19 exposure.  Low suspicion for strep pharyngitis as patient has no tonsillar exudate, tonsillar swelling, erythema to tonsils or oropharynx.  No signs of peritonsillar abscess.  No signs of Ludewig's angina.  Low suspicion for pneumonia as lungs clear to auscultation bilaterally.  Suspect the patient symptoms are due to viral illness.  Patient swabbed for COVID-19.  Patient advised to follow-up with COVID-19 results using [facility] My[facility].  Patient to contact his primary care provider on Tuesday if test results are positive.  Discussed results, findings, treatment and follow up. Patient advised of return precautions. Patient verbalized understanding and agreed with plan.  NASEAN ZAPF was evaluated in Emergency Department on 07/06/2021 for the symptoms described in the history of present illness. He was evaluated in the context of the global COVID-19 pandemic, which necessitated consideration that the patient might be at risk for infection with the SARS-CoV-2 virus that causes COVID-19. Institutional protocols and algorithms that pertain to the evaluation of patients at risk for COVID-19 are in a state of rapid change based on information released by regulatory bodies including the CDC and federal and state organizations. These policies and algorithms were followed during the patient's care in the ED.   Final Clinical Impression(s) / ED Diagnoses Final diagnoses:  Sore throat  Viral URI with cough    Rx / DC Orders ED Discharge Orders     None        Berneice Heinrich 07/06/21 2343    Derwood Kaplan, MD 07/10/21 [id number]

## 2021-07-06 NOTE — ED Triage Notes (Addendum)
Pt arrived requesting covid test after multiple exposures. Reports generalized fatigue, cough, and sore throat x 2 days

## 2021-07-06 NOTE — Discharge Instructions (Addendum)
You came to the emergency department today with reports of Covid-19 like symptoms.  These symptoms may be due to Covid 19 or another viral illness.  You have a COVID test pending. Please isolate at home while awaiting your results.  You can find your results on your [facility] MyChart in the next 24 hours. > If your test is negative, stay home until your fever has resolved/your symptoms are improving. > If your test is positive, isolate at home for at least 7 days after the day your symptoms initially began, and THEN at least 24 hours after you are fever-free without the help of medications (Tylenol/acetaminophen and Advil/ibuprofen/Motrin) AND your symptoms are improving.  You can alternate Tylenol/acetaminophen and Advil/ibuprofen/Motrin every 4 hours for sore throat, body aches, headache or fever.  Drink plenty of water.  Use saline nasal spray for congestion. Wash your hands frequently. Please rest as needed with frequent repositioning and ambulation as tolerated.    If you use a CPAP or BiPAP device for management of obstructive sleep apnea may continue to use it however use it when isolated from other individuals to avoid spread of COVID-19.   If you use a nebulizer administer medication such as albuterol you may continue to use it however only one isolated from other individuals to avoid the spread of COVID-19.  If your symptoms do not improve please follow-up with your primary care provider or urgent care.  Return to the ER for significant shortness of breath, uncontrollable vomiting, severe chest pain, inability to tolerate fluids, changes in mental status such as confusion or other concerning symptoms.

## 2021-07-07 LAB — SARS CORONAVIRUS 2 (TAT 6-24 HRS): SARS Coronavirus 2: NEGATIVE

## 2021-09-16 IMAGING — MR MR CERVICAL SPINE W/O CM
5 series · 33 of 48 positions shown · non-contrast
Comparison: None.

CLINICAL DATA: Left neck and shoulder pain

EXAM:
MRI CERVICAL SPINE WITHOUT CONTRAST
TECHNIQUE: Multiplanar, multisequence MR imaging of the cervical spine was
performed. No intravenous contrast was administered.

[Series 2: T2 · sagittal · 3.0mm · 0.69mm/px · 6 of 14 slices shown (1 of 3)]
[im 1/14]
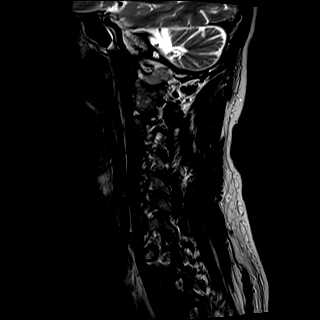
[im 3/14]
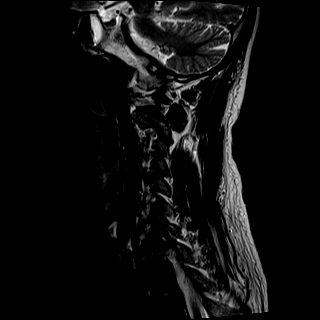
[im 6/14]
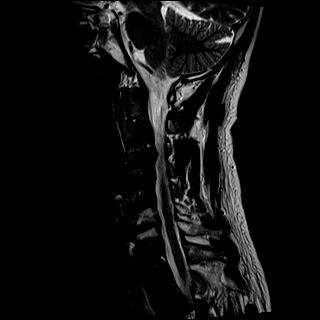
[im 8/14]
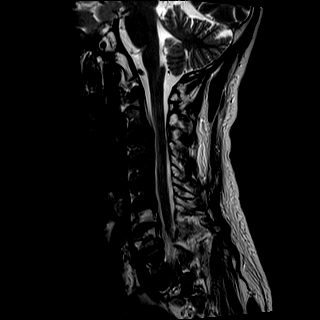
[im 11/14]
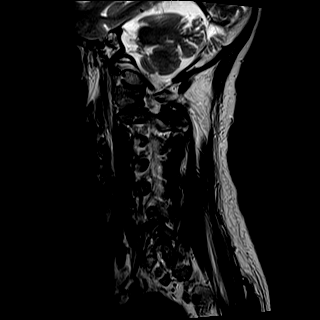
[im 14/14]
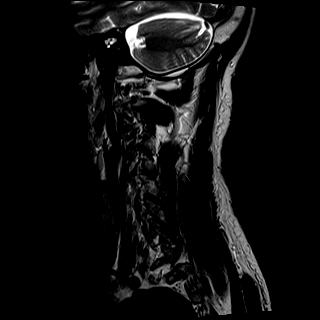

[Series 3: T1 · sagittal · 3.0mm · 0.69mm/px · 6 of 14 slices shown]
[im 1/14]
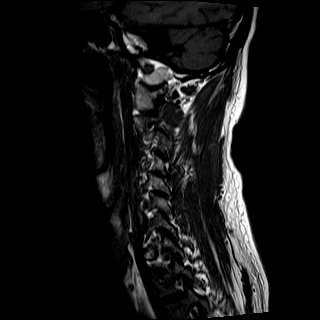
[im 3/14]
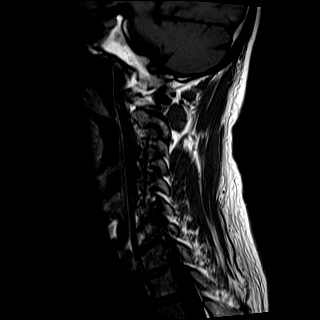
[im 6/14]
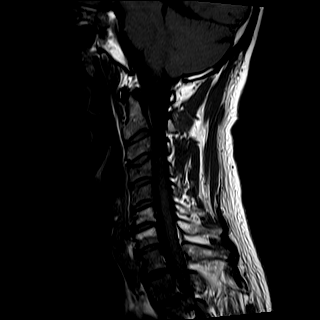
[im 8/14]
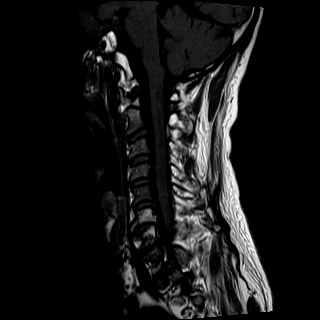
[im 11/14]
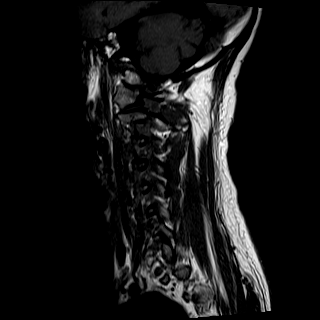
[im 14/14]
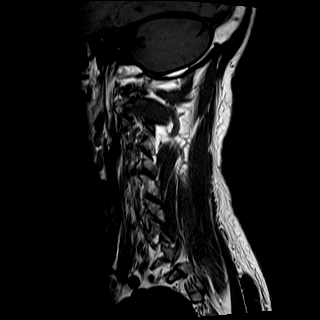

[Series 4: STIR · sagittal · 3.0mm · 0.69mm/px · 3 of 14 slices shown]
[im 1/14]
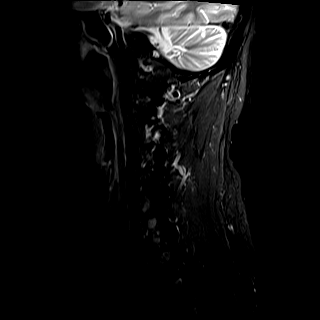
[im 3/14]
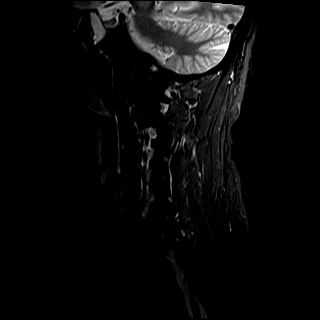
[im 6/14]
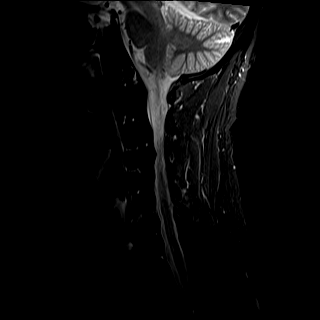

[Series 5: T2 · axial · 3.0mm · 0.62mm/px · z∈[-62,+59]mm · 9 of 36 slices shown (2 of 3)]
[im 1/36]
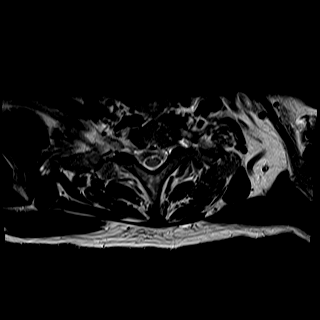
[im 6/36]
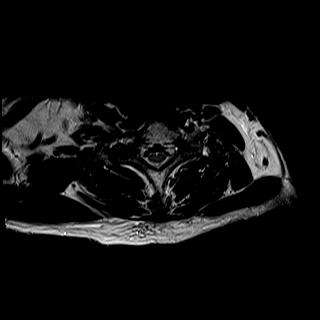
[im 11/36]
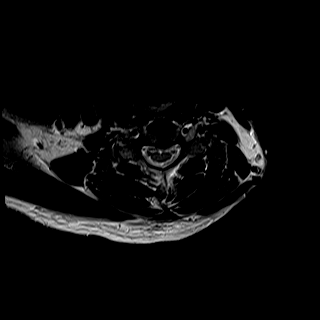
[im 16/36]
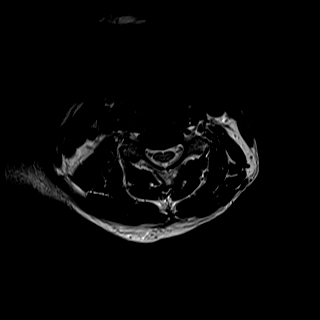
[im 18/36]
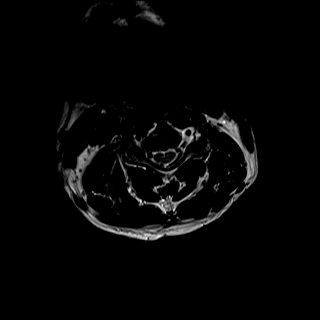
[im 21/36]
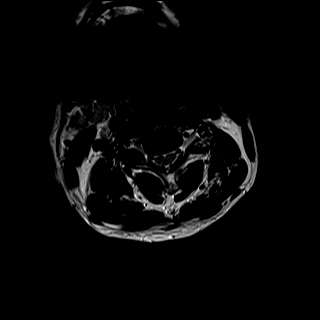
[im 26/36]
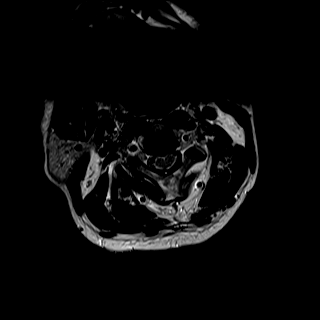
[im 31/36]
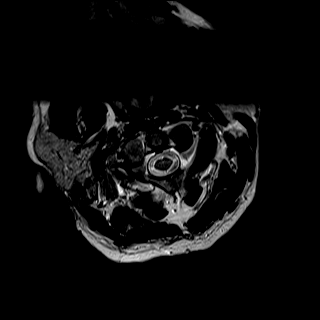
[im 36/36]
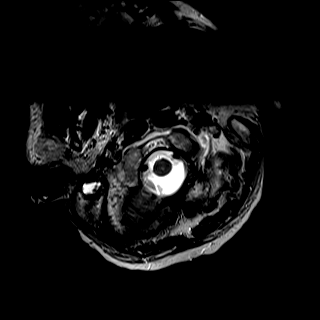

[Series 6: T2 · axial · 3.0mm · 0.39mm/px · z∈[-62,+59]mm · 9 of 36 slices shown (3 of 3)]
[im 1/36]
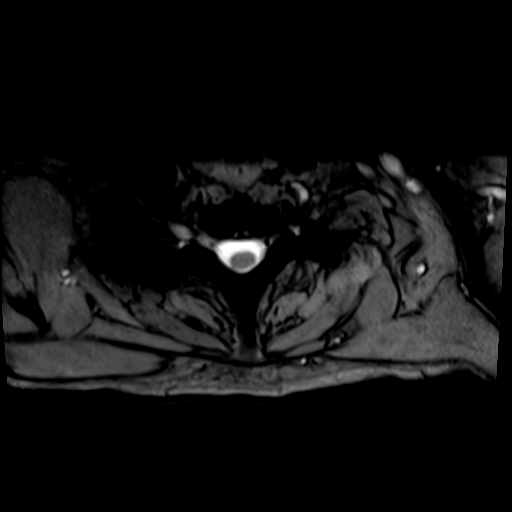
[im 6/36]
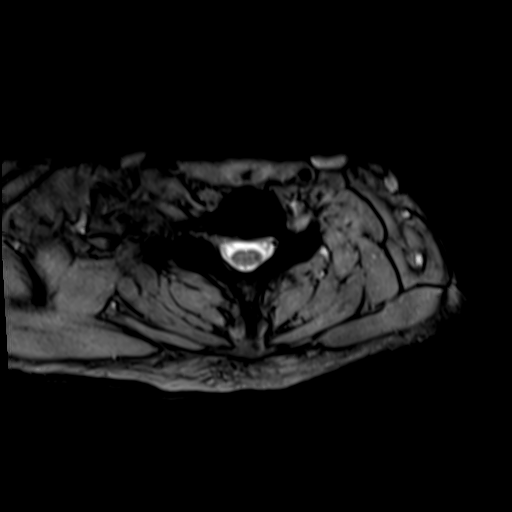
[im 11/36]
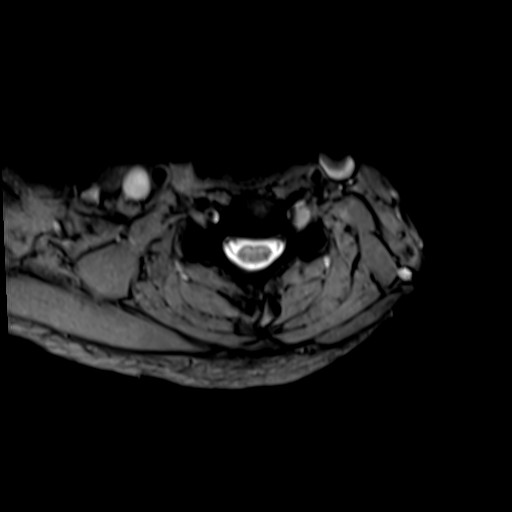
[im 16/36]
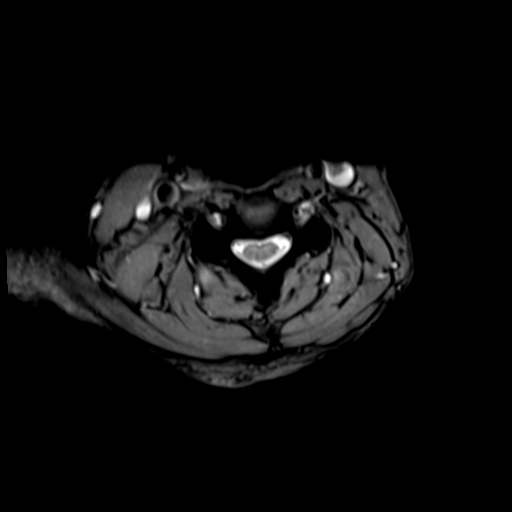
[im 18/36]
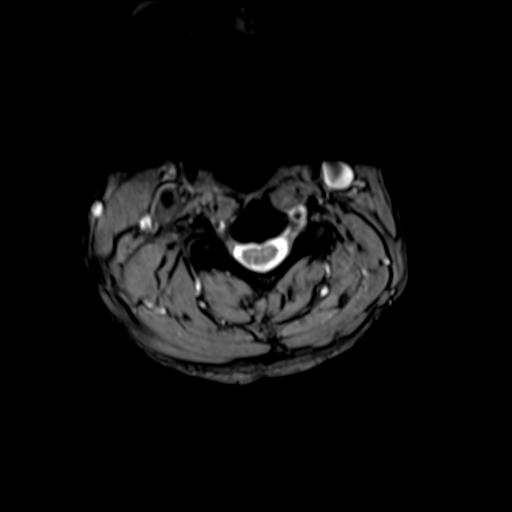
[im 21/36]
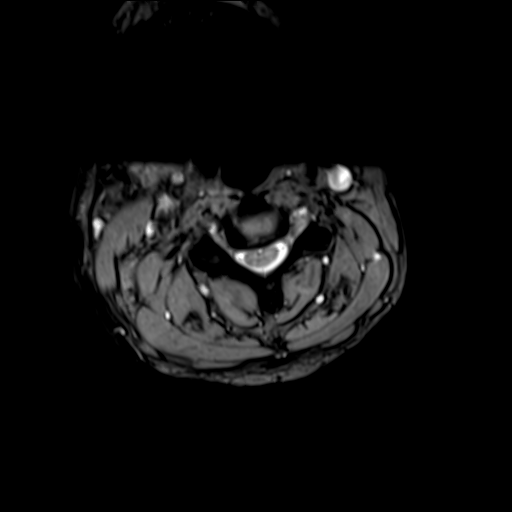
[im 26/36]
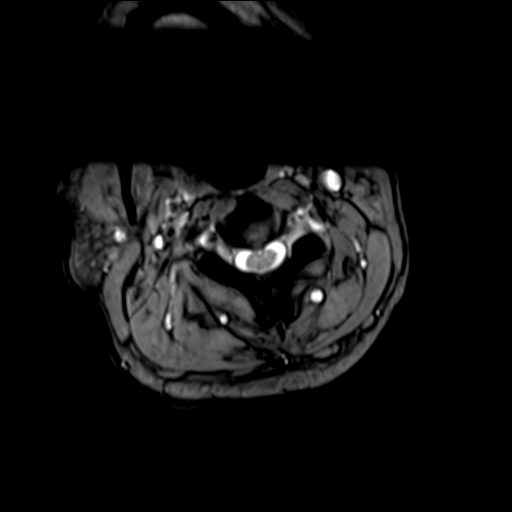
[im 31/36]
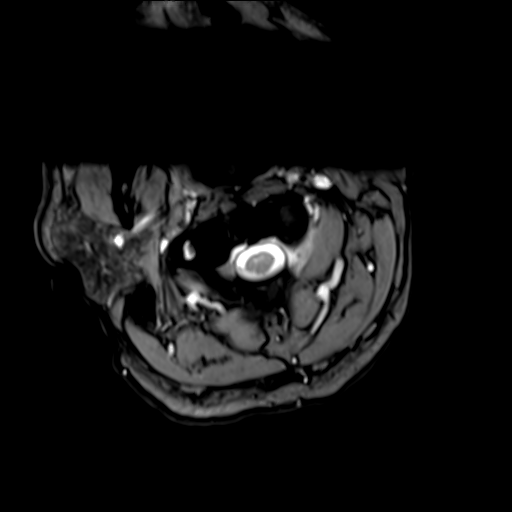
[im 36/36]
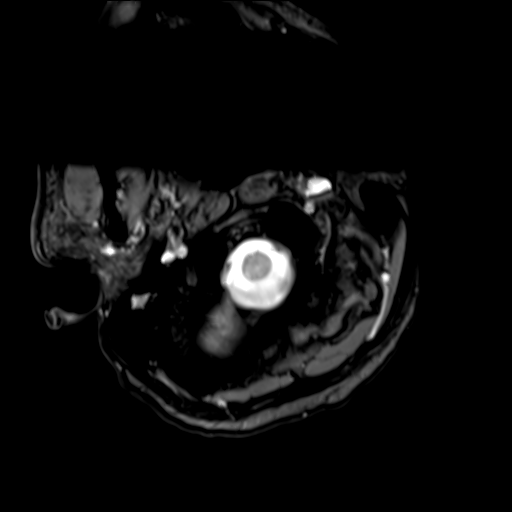

[33 of 48 positions shown; findings below may reference images not displayed]

FINDINGS: Alignment: Trace degenerative listhesis.

Vertebrae: There are chronic appearing degenerative endplate marrow
changes at C6 and C7. No substantial marrow edema. No suspicious
osseous lesion.

Cord: No abnormal signal.

Posterior Fossa, vertebral arteries, paraspinal tissues:
Unremarkable.

Disc levels:

C2-C3: Small central protrusion. Mild facet hypertrophy. Minor canal
stenosis. No significant foraminal stenosis.

C3-C4: Small right foraminal disc osteophyte complex. No significant
canal or left foraminal stenosis. Minor right foraminal stenosis.

C4-C5: Minimal disc bulge with endplate osteophytic ridging. No
significant canal or left foraminal stenosis. Minor right foraminal
stenosis.

C5-C6: Minimal disc bulge. No significant canal or foraminal
stenosis.

C6-C7: Disc osteophyte complex and uncovertebral hypertrophy. No
significant canal stenosis. Marked foraminal stenosis.

C7-T1: Disc osteophyte complex with possible superimposed left
foraminal protrusion which. No significant canal stenosis. Moderate
right and marked left foraminal stenosis.
IMPRESSION: Multilevel degenerative changes as detailed above. No high-grade
canal stenosis. There is significant left foraminal stenosis at
C6-C7 and C7-T1.

## 2022-04-01 ENCOUNTER — Other Ambulatory Visit: Payer: Self-pay

## 2022-04-01 ENCOUNTER — Emergency Department (HOSPITAL_COMMUNITY)
Admission: EM | Admit: 2022-04-01 | Discharge: 2022-04-02 | Discharge status: Against Medical Advice | Payer: No Typology Code available for payment source | Attending: Emergency Medicine | Admitting: Emergency Medicine

## 2022-04-01 DIAGNOSIS — R4781 Slurred speech: Secondary | ICD-10-CM | POA: Diagnosis present

## 2022-04-01 DIAGNOSIS — Z20822 Contact with and (suspected) exposure to covid-19: Secondary | ICD-10-CM | POA: Diagnosis not present

## 2022-04-01 DIAGNOSIS — Y908 Blood alcohol level of 240 mg/100 ml or more: Secondary | ICD-10-CM | POA: Diagnosis not present

## 2022-04-01 DIAGNOSIS — Z5321 Procedure and treatment not carried out due to patient leaving prior to being seen by health care provider: Secondary | ICD-10-CM | POA: Insufficient documentation

## 2022-04-01 DIAGNOSIS — R079 Chest pain, unspecified: Secondary | ICD-10-CM | POA: Insufficient documentation

## 2022-04-01 LAB — CBC
HCT: 43.6 % (ref 39.0–52.0)
Hemoglobin: 15.1 g/dL (ref 13.0–17.0)
MCH: 32.9 pg (ref 26.0–34.0)
MCHC: 34.6 g/dL (ref 30.0–36.0)
MCV: 95 fL (ref 80.0–100.0)
Platelets: 81 10*3/uL — ABNORMAL LOW (ref 150–400)
RBC: 4.59 MIL/uL (ref 4.22–5.81)
RDW: 16.4 % — ABNORMAL HIGH (ref 11.5–15.5)
WBC: 5.7 10*3/uL (ref 4.0–10.5)
nRBC: 0 % (ref 0.0–0.2)

## 2022-04-01 LAB — URINALYSIS, ROUTINE W REFLEX MICROSCOPIC
Bilirubin Urine: NEGATIVE
Glucose, UA: NEGATIVE mg/dL
Hgb urine dipstick: NEGATIVE
Ketones, ur: NEGATIVE mg/dL
Leukocytes,Ua: NEGATIVE
Nitrite: NEGATIVE
Protein, ur: NEGATIVE mg/dL
Specific Gravity, Urine: 1.005 (ref 1.005–1.030)
pH: 7 (ref 5.0–8.0)

## 2022-04-01 LAB — I-STAT CHEM 8, ED
BUN: 3 mg/dL — ABNORMAL LOW (ref 8–23)
Calcium, Ion: 1.03 mmol/L — ABNORMAL LOW (ref 1.15–1.40)
Chloride: 100 mmol/L (ref 98–111)
Creatinine, Ser: 1.1 mg/dL (ref 0.61–1.24)
Glucose, Bld: 87 mg/dL (ref 70–99)
HCT: 48 % (ref 39.0–52.0)
Hemoglobin: 16.3 g/dL (ref 13.0–17.0)
Potassium: 4 mmol/L (ref 3.5–5.1)
Sodium: 140 mmol/L (ref 135–145)
TCO2: 28 mmol/L (ref 22–32)

## 2022-04-01 LAB — DIFFERENTIAL
Abs Immature Granulocytes: 0.01 10*3/uL (ref 0.00–0.07)
Basophils Absolute: 0 10*3/uL (ref 0.0–0.1)
Basophils Relative: 1 %
Eosinophils Absolute: 0.1 10*3/uL (ref 0.0–0.5)
Eosinophils Relative: 1 %
Immature Granulocytes: 0 %
Lymphocytes Relative: 40 %
Lymphs Abs: 2.3 10*3/uL (ref 0.7–4.0)
Monocytes Absolute: 0.6 10*3/uL (ref 0.1–1.0)
Monocytes Relative: 11 %
Neutro Abs: 2.7 10*3/uL (ref 1.7–7.7)
Neutrophils Relative %: 47 %

## 2022-04-01 LAB — COMPREHENSIVE METABOLIC PANEL
ALT: 67 U/L — ABNORMAL HIGH (ref 0–44)
AST: 132 U/L — ABNORMAL HIGH (ref 15–41)
Albumin: 4.2 g/dL (ref 3.5–5.0)
Alkaline Phosphatase: 65 U/L (ref 38–126)
Anion gap: 13 (ref 5–15)
BUN: <5 mg/dL — ABNORMAL LOW (ref 8–23)
CO2: 26 mmol/L (ref 22–32)
Calcium: 9.3 mg/dL (ref 8.9–10.3)
Chloride: 100 mmol/L (ref 98–111)
Creatinine, Ser: 0.72 mg/dL (ref 0.61–1.24)
GFR, Estimated: >60 mL/min (ref >60)
Glucose, Bld: 94 mg/dL (ref 70–99)
Potassium: 3.9 mmol/L (ref 3.5–5.1)
Sodium: 139 mmol/L (ref 135–145)
Total Bilirubin: 0.6 mg/dL (ref 0.3–1.2)
Total Protein: 8.5 g/dL — ABNORMAL HIGH (ref 6.5–8.1)

## 2022-04-01 LAB — TROPONIN I (HIGH SENSITIVITY): Troponin I (High Sensitivity): 10 ng/L (ref <18)

## 2022-04-01 LAB — RAPID URINE DRUG SCREEN, HOSP PERFORMED
Amphetamines: NOT DETECTED
Barbiturates: NOT DETECTED
Benzodiazepines: NOT DETECTED
Cocaine: NOT DETECTED
Opiates: NOT DETECTED
Tetrahydrocannabinol: NOT DETECTED

## 2022-04-01 LAB — PROTIME-INR
INR: 1.1 (ref 0.8–1.2)
Prothrombin Time: 13.9 seconds (ref 11.4–15.2)

## 2022-04-01 LAB — RESP PANEL BY RT-PCR (FLU A&B, COVID) ARPGX2
Influenza A by PCR: NEGATIVE
Influenza B by PCR: NEGATIVE
SARS Coronavirus 2 by RT PCR: NEGATIVE

## 2022-04-01 LAB — APTT: aPTT: 34 seconds (ref 24–36)

## 2022-04-01 NOTE — ED Provider Triage Note (Signed)
  Emergency Medicine Provider Triage Evaluation Note  MRN:  [id number]  Arrival date & time: 04/01/22    Medically screening exam initiated at 10:27 PM.   CC:   Slurred speech  HPI:  William Baxter is a 61 y.o. year-old male presents to the ED with chief complaint of slurred speech, chest pain, and "feeling shaky."  Onset of symptoms was around 4 or 5 PM this afternoon.  Patient slurred speech has resolved.  Patient states that he had been drinking alcohol because he was watching sports.  He states that he has trouble with his left arm at baseline due to a pinched nerve, but this is not new or different.  He states that he "just did not feel right" and is afraid he had a stroke.  History provided by History provided by patient. ROS:  -As included in HPI PE:   Vitals:   04/01/22 2219  BP: 135/87  Pulse: (!) 106  Resp: 18  Temp: 98 F (36.7 C)  SpO2: 95%    Non-toxic appearing No respiratory distress Moves all extremities CN 3-12 intact Speech is clear MDM:  Based on signs and symptoms, TIA is considered due to reported slurred speech, but could be ETOH related.  No neuro deficits now.  He also complained of chest pain during the episode.  Has hx of MI . I've ordered TIA and chest pain workup in triage to expedite lab/diagnostic workup.  Patient was informed that the remainder of the evaluation will be completed by another provider, this initial triage assessment does not replace that evaluation, and the importance of remaining in the ED until their evaluation is complete.    Roxy Horseman, PA-C 04/01/22 2230

## 2022-04-01 NOTE — ED Triage Notes (Signed)
Pt here for an episode of slurred speech earlier today around 1700. Pt reports feeling shaky and having some chest pain during episode. Pt LKN 1600, symptoms have now resolved. Pt reports he has drank alcohol today. Pt has hx of problems w L arm due to pinched nerve

## 2022-04-01 NOTE — ED Notes (Addendum)
Pt step outside 

## 2022-04-02 ENCOUNTER — Emergency Department (HOSPITAL_COMMUNITY): Payer: No Typology Code available for payment source

## 2022-04-02 LAB — ETHANOL: Alcohol, Ethyl (B): 378 mg/dL (ref <10)

## 2022-04-02 NOTE — ED Notes (Signed)
PT leaving with family member at this time

## 2022-04-03 ENCOUNTER — Emergency Department (HOSPITAL_COMMUNITY)
Admission: EM | Admit: 2022-04-03 | Discharge: 2022-04-03 | Disposition: A | Discharge status: Home / Self Care | Payer: No Typology Code available for payment source | Attending: Emergency Medicine | Admitting: Emergency Medicine

## 2022-04-03 ENCOUNTER — Other Ambulatory Visit: Payer: Self-pay

## 2022-04-03 ENCOUNTER — Encounter (HOSPITAL_COMMUNITY): Payer: Self-pay | Admitting: Pharmacy Technician

## 2022-04-03 DIAGNOSIS — Z7901 Long term (current) use of anticoagulants: Secondary | ICD-10-CM | POA: Diagnosis not present

## 2022-04-03 DIAGNOSIS — Z7952 Long term (current) use of systemic steroids: Secondary | ICD-10-CM | POA: Diagnosis not present

## 2022-04-03 DIAGNOSIS — J449 Chronic obstructive pulmonary disease, unspecified: Secondary | ICD-10-CM | POA: Insufficient documentation

## 2022-04-03 DIAGNOSIS — R112 Nausea with vomiting, unspecified: Secondary | ICD-10-CM | POA: Diagnosis present

## 2022-04-03 DIAGNOSIS — I2510 Atherosclerotic heart disease of native coronary artery without angina pectoris: Secondary | ICD-10-CM | POA: Insufficient documentation

## 2022-04-03 DIAGNOSIS — R251 Tremor, unspecified: Secondary | ICD-10-CM | POA: Diagnosis not present

## 2022-04-03 DIAGNOSIS — R0602 Shortness of breath: Secondary | ICD-10-CM | POA: Diagnosis not present

## 2022-04-03 DIAGNOSIS — R1084 Generalized abdominal pain: Secondary | ICD-10-CM | POA: Diagnosis not present

## 2022-04-03 DIAGNOSIS — Z7982 Long term (current) use of aspirin: Secondary | ICD-10-CM | POA: Diagnosis not present

## 2022-04-03 DIAGNOSIS — R7401 Elevation of levels of liver transaminase levels: Secondary | ICD-10-CM | POA: Diagnosis not present

## 2022-04-03 LAB — CBC WITH DIFFERENTIAL/PLATELET
Abs Immature Granulocytes: 0.02 10*3/uL (ref 0.00–0.07)
Basophils Absolute: 0 10*3/uL (ref 0.0–0.1)
Basophils Relative: 1 %
Eosinophils Absolute: 0.1 10*3/uL (ref 0.0–0.5)
Eosinophils Relative: 2 %
HCT: 40.5 % (ref 39.0–52.0)
Hemoglobin: 14.1 g/dL (ref 13.0–17.0)
Immature Granulocytes: 1 %
Lymphocytes Relative: 24 %
Lymphs Abs: 1 10*3/uL (ref 0.7–4.0)
MCH: 32.9 pg (ref 26.0–34.0)
MCHC: 34.8 g/dL (ref 30.0–36.0)
MCV: 94.6 fL (ref 80.0–100.0)
Monocytes Absolute: 0.4 10*3/uL (ref 0.1–1.0)
Monocytes Relative: 10 %
Neutro Abs: 2.7 10*3/uL (ref 1.7–7.7)
Neutrophils Relative %: 62 %
Platelets: 64 10*3/uL — ABNORMAL LOW (ref 150–400)
RBC: 4.28 MIL/uL (ref 4.22–5.81)
RDW: 15.9 % — ABNORMAL HIGH (ref 11.5–15.5)
WBC: 4.3 10*3/uL (ref 4.0–10.5)
nRBC: 0 % (ref 0.0–0.2)

## 2022-04-03 LAB — COMPREHENSIVE METABOLIC PANEL
ALT: 62 U/L — ABNORMAL HIGH (ref 0–44)
AST: 124 U/L — ABNORMAL HIGH (ref 15–41)
Albumin: 4 g/dL (ref 3.5–5.0)
Alkaline Phosphatase: 65 U/L (ref 38–126)
Anion gap: 11 (ref 5–15)
BUN: 6 mg/dL — ABNORMAL LOW (ref 8–23)
CO2: 25 mmol/L (ref 22–32)
Calcium: 9.2 mg/dL (ref 8.9–10.3)
Chloride: 100 mmol/L (ref 98–111)
Creatinine, Ser: 0.65 mg/dL (ref 0.61–1.24)
GFR, Estimated: >60 mL/min (ref >60)
Glucose, Bld: 95 mg/dL (ref 70–99)
Potassium: 3.7 mmol/L (ref 3.5–5.1)
Sodium: 136 mmol/L (ref 135–145)
Total Bilirubin: 1 mg/dL (ref 0.3–1.2)
Total Protein: 8.5 g/dL — ABNORMAL HIGH (ref 6.5–8.1)

## 2022-04-03 LAB — ETHANOL: Alcohol, Ethyl (B): <10 mg/dL (ref <10)

## 2022-04-03 LAB — AMMONIA: Ammonia: 39 umol/L — ABNORMAL HIGH (ref 9–35)

## 2022-04-03 LAB — TROPONIN I (HIGH SENSITIVITY)
Troponin I (High Sensitivity): 9 ng/L (ref <18)
Troponin I (High Sensitivity): 10 ng/L (ref <18)

## 2022-04-03 MED ORDER — LORAZEPAM 2 MG/ML IJ SOLN
1.0000 mg | Freq: Once | INTRAMUSCULAR | Status: AC
  Administered 2022-04-03: 1 mg via INTRAVENOUS
  Filled 2022-04-03: qty 1

## 2022-04-03 MED ORDER — LACTATED RINGERS IV BOLUS
1000.0000 mL | Freq: Once | INTRAVENOUS | Status: AC
  Administered 2022-04-03: 1000 mL via INTRAVENOUS

## 2022-04-03 NOTE — ED Provider Notes (Signed)
[facility] DEPT Provider Note   CSN: [id number] Arrival date & time: 04/03/22  0736     History  Chief Complaint  Patient presents with   Emesis   Shortness of Breath    William Baxter is a 61 y.o. male.  Pt is a 61y/o male with hx of CAD, fatty liver, COPD, chronic thrombocytopenia, depression, IBS who is presenting today due to generalized shaking nausea and vomiting.  Patient is a vague historian and reports the symptoms have been going over multiple days however does not give further detail except that he was at the [place] yesterday.  Most of the medical information comes from the [place] records from yesterday.  Patient was seen there for dizzy episodes, possible loss of consciousness and generally feeling unwell.  He had labs at the [place] yesterday that showed an alcohol level of 280 with otherwise normal labs.  There was report that he had had several episodes over the last year of blackout spells where he would injure himself and fall and there was a concern for possible seizures.  He had a head CT done yesterday as well as a chest x-ray which were normal.  He was given Keppra but reports that he was planning on starting it today but when he woke up this morning he was tremulous all over, nauseated and vomiting.  He felt very hot last night but did not know if he was running a fever.  He has had no diarrhea and reports his stomach just feels sore.  The family member with him reports he has not had any seizure-like activity today but has just been shaky.  When asking him he reports that he does not drink alcohol every day which was similar to what he reported to the [place] and denies drug use and an occasional tobacco use.  He has been taking his home medications as prescribed and is just not started the new one.  He reports he does not feel significantly nauseated at this moment and said he was short of breath this morning when he was vomiting but denies any shortness of  breath currently.  He denies chest pain.  The history is provided by the patient, medical records and a relative.  Emesis Shortness of Breath Associated symptoms: vomiting       Home Medications Prior to Admission medications   Medication Sig Start Date End Date Taking? Authorizing Provider  acetaminophen (TYLENOL) 325 MG tablet Take 325 mg by mouth every 6 (six) hours as needed for mild pain.     [provider]  albuterol (PROVENTIL HFA;VENTOLIN HFA) 108 (90 Base) MCG/ACT inhaler Inhale 2 puffs into the lungs every 6 (six) hours as needed for wheezing or shortness of breath. 06/15/16   Geradine Girt, DO  aspirin EC 81 MG EC tablet Take 1 tablet (81 mg total) by mouth daily. 12/19/13   Dixie Dials, MD  atorvastatin (LIPITOR) 80 MG tablet Take 1 tablet (80 mg total) by mouth daily at 6 PM. 12/19/13   Dixie Dials, MD  ciprofloxacin (CIPRO) 500 MG tablet Take 1 tablet (500 mg total) by mouth 2 (two) times daily. 09/11/19   Eustaquio Maize, PA-C  clopidogrel (PLAVIX) 75 MG tablet Take 1 tablet (75 mg total) by mouth daily with breakfast. 12/19/13   Dixie Dials, MD  cyclobenzaprine (FLEXERIL) 10 MG tablet Take 1 tablet (10 mg total) by mouth 2 (two) times daily as needed for muscle spasms. 01/19/20   Dorie Rank,  MD  etodolac (LODINE) 300 MG capsule Take 1 capsule (300 mg total) by mouth every 8 (eight) hours. 01/19/20   Dorie Rank, MD  isosorbide mononitrate (IMDUR) 60 MG 24 hr tablet Take 60 mg by mouth daily.    [provider]  metoprolol (LOPRESSOR) 25 MG tablet Take 1 tablet (25 mg total) by mouth 2 (two) times daily. 06/15/16   Geradine Girt, DO  metroNIDAZOLE (FLAGYL) 500 MG tablet Take 1 tablet (500 mg total) by mouth 2 (two) times daily. 09/11/19   Alroy Bailiff, Margaux, PA-C  nitroGLYCERIN (NITROSTAT) 0.4 MG SL tablet Place 1 tablet (0.4 mg total) under the tongue every 5 (five) minutes as needed for chest pain. 12/19/13   Dixie Dials, MD  oxyCODONE-acetaminophen  (PERCOCET/ROXICET) 5-325 MG tablet Take 1 tablet by mouth every 6 (six) hours as needed for severe pain. 02/06/20   Marybelle Killings, MD  permethrin (ELIMITE) 5 % cream Apply Head to toe and wash off after 8 hours 03/11/17   Lawyer, Harrell Gave, PA-C  polyvinyl alcohol (LIQUIFILM TEARS) 1.4 % ophthalmic solution Place 1 drop into both eyes as needed for dry eyes.    [provider]  predniSONE (STERAPRED UNI-PAK 21 TAB) 10 MG (21) TBPK tablet Take as instructed, 6,5,4,3,2,1 tablets daily then stop . 02/19/20   Marybelle Killings, MD  tamsulosin (FLOMAX) 0.4 MG CAPS capsule Take 0.4 mg by mouth daily.    [provider]      Allergies    Penicillins    Review of Systems   Review of Systems  Respiratory:  Positive for shortness of breath.   Gastrointestinal:  Positive for vomiting.   Physical Exam Updated Vital Signs BP 131/86   Pulse 83   Temp 99.1 F (37.3 C) (Oral)   Resp (!) 24   Ht 5\' 11"  (1.803 m)   Wt 77.1 kg   SpO2 93%   BMI 23.71 kg/m  Physical Exam Vitals and nursing note reviewed.  Constitutional:      General: He is not in acute distress.    Appearance: He is well-developed.  HENT:     Head: Normocephalic and atraumatic.     Mouth/Throat:     Mouth: Mucous membranes are dry.  Eyes:     Conjunctiva/sclera: Conjunctivae normal.     Pupils: Pupils are equal, round, and reactive to light.  Cardiovascular:     Rate and Rhythm: Normal rate and regular rhythm.     Heart sounds: No murmur heard. Pulmonary:     Effort: Pulmonary effort is normal. No respiratory distress.     Breath sounds: Normal breath sounds. No wheezing or rales.  Abdominal:     General: There is no distension.     Palpations: Abdomen is soft.     Tenderness: There is abdominal tenderness. There is no guarding or rebound.     Comments: Mild diffuse tenderness without rebound or guarding  Musculoskeletal:        General: No tenderness. Normal range of motion.     Cervical back: Normal  range of motion and neck supple.  Skin:    General: Skin is warm and dry.     Findings: No erythema or rash.  Neurological:     Mental Status: He is alert and oriented to person, place, and time. Mental status is at baseline.     Comments: Slightly tremulous in the upper extremities.  No tongue fasciculations.  Psychiatric:        Mood and  Affect: Mood normal.        Behavior: Behavior normal.    ED Results / Procedures / Treatments   Labs (all labs ordered are listed, but only abnormal results are displayed) Labs Reviewed  CBC WITH DIFFERENTIAL/PLATELET - Abnormal; Notable for the following components:      Result Value   RDW 15.9 (*)    Platelets 64 (*)    All other components within normal limits  COMPREHENSIVE METABOLIC PANEL - Abnormal; Notable for the following components:   BUN 6 (*)    Total Protein 8.5 (*)    AST 124 (*)    ALT 62 (*)    All other components within normal limits  AMMONIA - Abnormal; Notable for the following components:   Ammonia 39 (*)    All other components within normal limits  ETHANOL  TROPONIN I (HIGH SENSITIVITY)  TROPONIN I (HIGH SENSITIVITY)    EKG EKG Interpretation  Date/Time:  Friday April 03 2022 08:28:00 EDT Ventricular Rate:  89 PR Interval:  149 QRS Duration: 75 QT Interval:  369 QTC Calculation: 449 R Axis:   82 Text Interpretation: Sinus rhythm Borderline right axis deviation No significant change since last tracing Confirmed by Blanchie Dessert [phone]) on 04/03/2022 10:22:59 AM  Radiology No results found.  Procedures Procedures    Medications Ordered in ED Medications  lactated ringers bolus 1,000 mL (0 mLs Intravenous Stopped 04/03/22 1103)  LORazepam (ATIVAN) injection 1 mg (1 mg Intravenous Given 04/03/22 [id number])    ED Course/ Medical Decision Making/ A&P                           Medical Decision Making Amount and/or Complexity of Data Reviewed Independent Historian: friend External Data Reviewed: notes.     Details: [facility] Labs: ordered. Decision-making details documented in ED Course. ECG/medicine tests: ordered and independent interpretation performed. Decision-making details documented in ED Course.  Risk Prescription drug management.   Pt with multiple medical problems and comorbidities and presenting today with a complaint that caries a high risk for morbidity and mortality.  Presenting today with nausea and vomiting and not feeling better.  Patient was evaluated by the [facility] yesterday and went to the [facility] emergency room on 04/01/2022 with complaints of aphasia.  He was seen yesterday for neuro type complaints but he reports he has been shaky and nauseated for the last for 5 days.  At the [facility] patient did have a significantly elevated alcohol level and he reported that he had not had much to eat and had been drinking some beers but the game.  He denies daily alcohol use however concern for possible alcohol withdrawal or intoxication.  Patient is displaying no evidence of seizure activity here and denied by family member who was in the room with him.  Patient is hypertensive and borderline tachycardic.  He has no peritoneal signs on abdominal exam.  Lower suspicion for appendicitis, obstruction or diverticulitis.  He denies any urinary symptoms.  We will give a dose of Ativan and IV fluids.  Will reassess labs but patient had a head CT and a chest x-ray done yesterday which were normal and sats are 100% on room air with clear breath sounds.  Low suspicion for acute respiratory cause of patient's symptoms.  No evidence of pneumonia yesterday.  We will also check EKG and troponin to ensure no atypical ACS component.  Patient already has scheduled outpatient EEG, MRI and neurology  follow-up.  3:44 PM I independently interpreted patient's EKG and labs.  Patient's EKG without acute findings.  CBC within normal limits except for a platelet count of 64 which seems to be consistent with his chronic  thrombocytopenia, alcohol level today is 0, ammonia is mildly elevated at 39, CMP with mild LFT elevation AST greater than ALT but normal total bilirubin and creatinine.  After IV fluids and 1 dose of Ativan patient's blood pressure is now improved his symptoms are resolved and patient feels much better.  He is tolerating p.o.'s and would like to go home.  Patient may have been having effects of heavy drinking yesterday.  Did encourage him to follow-up with his doctors visit with the neurologist for EEG and MRI.  He was given return precautions.  Family is present in the room and findings were discussed with both of them.  They have no questions he does not meet any admission criteria and was discharged home in good condition.         Final Clinical Impression(s) / ED Diagnoses Final diagnoses:  Nausea and vomiting, unspecified vomiting type    Rx / DC Orders ED Discharge Orders     None         Blanchie Dessert, MD 04/03/22 1546

## 2022-04-03 NOTE — ED Notes (Signed)
Pt d/c home per MD order. Discharge summary reviewed, pt verbalizes understanding. Ambulatory off unit. No s/s of acute distress noted at discharge.  °

## 2022-04-03 NOTE — ED Triage Notes (Signed)
Pt here via POV with reports of vomiting and shob X2 days.

## 2022-09-15 ENCOUNTER — Ambulatory Visit (HOSPITAL_COMMUNITY)
Admission: EM | Admit: 2022-09-15 | Discharge: 2022-09-15 | Disposition: A | Discharge status: Home / Self Care | Payer: No Typology Code available for payment source | Attending: Family Medicine | Admitting: Family Medicine

## 2022-09-15 ENCOUNTER — Encounter (HOSPITAL_COMMUNITY): Payer: Self-pay

## 2022-09-15 DIAGNOSIS — R1031 Right lower quadrant pain: Secondary | ICD-10-CM | POA: Insufficient documentation

## 2022-09-15 DIAGNOSIS — R300 Dysuria: Secondary | ICD-10-CM | POA: Insufficient documentation

## 2022-09-15 DIAGNOSIS — R1032 Left lower quadrant pain: Secondary | ICD-10-CM | POA: Diagnosis present

## 2022-09-15 DIAGNOSIS — Z202 Contact with and (suspected) exposure to infections with a predominantly sexual mode of transmission: Secondary | ICD-10-CM | POA: Insufficient documentation

## 2022-09-15 LAB — POCT URINALYSIS DIPSTICK, ED / UC
Bilirubin Urine: NEGATIVE
Glucose, UA: NEGATIVE mg/dL
Ketones, ur: NEGATIVE mg/dL
Nitrite: NEGATIVE
Protein, ur: NEGATIVE mg/dL
Specific Gravity, Urine: 1.015 (ref 1.005–1.030)
Urobilinogen, UA: 2 mg/dL — ABNORMAL HIGH (ref 0.0–1.0)
pH: 7 (ref 5.0–8.0)

## 2022-09-15 MED ORDER — SULFAMETHOXAZOLE-TRIMETHOPRIM 800-160 MG PO TABS: 1.0000 | ORAL_TABLET | Freq: Two times a day (BID) | ORAL | 0 refills | Status: AC

## 2022-09-15 NOTE — ED Triage Notes (Signed)
Pt c/o lower abdominal pain x3 days with the pain shifting from side to side. LNBM this am.  Pt requesting STD testing. States wore a condom Sunday but don't feel right. Denies sx's.

## 2022-09-15 NOTE — Discharge Instructions (Signed)
We have sent testing for sexually transmitted infections. We will notify you of any positive results once they are received. If required, we will prescribe any medications you might need.  Please refrain from all sexual activity for at least the next seven days.  

## 2022-09-15 NOTE — ED Provider Notes (Signed)
[facility]   [id number] 09/15/22 Arrival Time: 1157  ASSESSMENT & PLAN:  1. Possible exposure to STD   2. Abdominal discomfort, bilateral lower quadrant    Labs Reviewed  POCT URINALYSIS DIPSTICK, ED / UC - Abnormal; Notable for the following components:      Result Value   Hgb urine dipstick TRACE (*)    Urobilinogen, UA 2.0 (*)    Leukocytes,Ua LARGE (*)    All other components within normal limits  URINE CULTURE  CYTOLOGY, (ORAL, ANAL, URETHRAL) ANCILLARY ONLY   Will treat for cystitis given U/A and lower abd discomfort; benign abd exam. Urine culture sent along with urethral cytology.  Meds ordered this encounter  Medications   sulfamethoxazole-trimethoprim (BACTRIM DS) 800-160 MG tablet    Sig: Take 1 tablet by mouth 2 (two) times daily for 10 days.    Dispense:  14 tablet    Refill:  0      Discharge Instructions      We have sent testing for sexually transmitted infections. We will notify you of any positive results once they are received. If required, we will prescribe any medications you might need.  Please refrain from all sexual activity for at least the next seven days.     Pending: Labs Reviewed  POCT URINALYSIS DIPSTICK, ED / UC  CYTOLOGY, (ORAL, ANAL, URETHRAL) ANCILLARY ONLY    Will notify of any positive results. Instructed to refrain from sexual activity for at least seven days.  Reviewed expectations re: course of current medical issues. Questions answered. Outlined signs and symptoms indicating need for more acute intervention. Patient verbalized understanding. After Visit Summary given.   SUBJECTIVE:  William Baxter is a 61 y.o. male who presents with complaint of lower abdominal discomfort; x 2-3 days; started after having sex with new male partner recently. No penile discharge. "Just don't feel right down there.". Afebrile. Normal PO intake without n/v/d.  OBJECTIVE:  Vitals:   09/15/22 1406  BP: (!) 153/106   Pulse: 98  Resp: 18  Temp: 98.4 F (36.9 C)  TempSrc: Oral  SpO2: 96%    General appearance: alert, cooperative, appears stated age and no distress Throat: lips, mucosa, and tongue normal; teeth and gums normal Lungs: unlabored respirations; speaks full sentences without difficulty Back: no CVA tenderness; FROM at waist Abdomen: soft, non-tender; mild and vague lower abd tenderness; "not too bad, just feels a little sore" GU: deferred Skin: warm and dry Psychological: alert and cooperative; normal mood and affect.    Labs Reviewed  POCT URINALYSIS DIPSTICK, ED / UC  CYTOLOGY, (ORAL, ANAL, URETHRAL) ANCILLARY ONLY    Allergies  Allergen Reactions   Penicillins Nausea And Vomiting    Past Medical History:  Diagnosis Date   BPH (benign prostatic hyperplasia)    Heart disease    Medical history non-contributory    Migraine    Myocardial infarction acute (HCC)    History reviewed. No pertinent family history. Social History   Socioeconomic History   Marital status: Divorced    Spouse name: Not on file   Number of children: Not on file   Years of education: Not on file   Highest education level: Not on file  Occupational History   Not on file  Tobacco Use   Smoking status: Every Day    Packs/day: 0.00    Types: Cigarettes   Smokeless tobacco: Never  Substance and Sexual Activity   Alcohol use: Yes    Comment: occ  Drug use: No   Sexual activity: Not on file  Other Topics Concern   Not on file  Social History Narrative   Not on file   Social Determinants of Health   Financial Resource Strain: Not on file  Food Insecurity: Not on file  Transportation Needs: Not on file  Physical Activity: Not on file  Stress: Not on file  Social Connections: Not on file  Intimate Partner Violence: Not on file           Mardella Layman, MD 09/16/22 1051

## 2022-09-16 LAB — CYTOLOGY, (ORAL, ANAL, URETHRAL) ANCILLARY ONLY
Chlamydia: NEGATIVE
Comment: NEGATIVE
Comment: NEGATIVE
Comment: NORMAL
Neisseria Gonorrhea: NEGATIVE
Trichomonas: POSITIVE — AB

## 2022-09-17 ENCOUNTER — Telehealth (HOSPITAL_COMMUNITY): Payer: Self-pay | Admitting: Emergency Medicine

## 2022-09-17 LAB — URINE CULTURE: Culture: NO GROWTH

## 2022-09-17 MED ORDER — METRONIDAZOLE 500 MG PO TABS: 2000.0000 mg | ORAL_TABLET | Freq: Once | ORAL | 0 refills | Status: AC

## 2023-08-15 ENCOUNTER — Other Ambulatory Visit: Payer: Self-pay

## 2023-08-15 ENCOUNTER — Inpatient Hospital Stay (HOSPITAL_COMMUNITY): Payer: No Typology Code available for payment source

## 2023-08-15 ENCOUNTER — Inpatient Hospital Stay (HOSPITAL_COMMUNITY)
Admission: EM | Admit: 2023-08-15 | Discharge: 2023-08-20 | DRG: 896 | Disposition: A | Discharge status: Home / Self Care | Payer: No Typology Code available for payment source | Attending: Internal Medicine | Admitting: Internal Medicine

## 2023-08-15 ENCOUNTER — Encounter (HOSPITAL_COMMUNITY): Payer: Self-pay | Admitting: Internal Medicine

## 2023-08-15 DIAGNOSIS — Z9861 Coronary angioplasty status: Secondary | ICD-10-CM

## 2023-08-15 DIAGNOSIS — F10939 Alcohol use, unspecified with withdrawal, unspecified: Secondary | ICD-10-CM | POA: Diagnosis present

## 2023-08-15 DIAGNOSIS — Z79899 Other long term (current) drug therapy: Secondary | ICD-10-CM

## 2023-08-15 DIAGNOSIS — D6959 Other secondary thrombocytopenia: Secondary | ICD-10-CM | POA: Diagnosis present

## 2023-08-15 DIAGNOSIS — Z91148 Patient's other noncompliance with medication regimen for other reason: Secondary | ICD-10-CM | POA: Diagnosis not present

## 2023-08-15 DIAGNOSIS — I252 Old myocardial infarction: Secondary | ICD-10-CM

## 2023-08-15 DIAGNOSIS — F10231 Alcohol dependence with withdrawal delirium: Secondary | ICD-10-CM | POA: Diagnosis present

## 2023-08-15 DIAGNOSIS — F10931 Alcohol use, unspecified with withdrawal delirium: Secondary | ICD-10-CM | POA: Diagnosis not present

## 2023-08-15 DIAGNOSIS — N400 Enlarged prostate without lower urinary tract symptoms: Secondary | ICD-10-CM | POA: Diagnosis present

## 2023-08-15 DIAGNOSIS — Z880 Allergy status to penicillin: Secondary | ICD-10-CM

## 2023-08-15 DIAGNOSIS — I2510 Atherosclerotic heart disease of native coronary artery without angina pectoris: Secondary | ICD-10-CM

## 2023-08-15 DIAGNOSIS — M6282 Rhabdomyolysis: Secondary | ICD-10-CM | POA: Diagnosis present

## 2023-08-15 DIAGNOSIS — D696 Thrombocytopenia, unspecified: Secondary | ICD-10-CM | POA: Diagnosis not present

## 2023-08-15 DIAGNOSIS — E871 Hypo-osmolality and hyponatremia: Secondary | ICD-10-CM | POA: Diagnosis present

## 2023-08-15 DIAGNOSIS — K760 Fatty (change of) liver, not elsewhere classified: Secondary | ICD-10-CM | POA: Diagnosis present

## 2023-08-15 DIAGNOSIS — G9341 Metabolic encephalopathy: Secondary | ICD-10-CM | POA: Diagnosis present

## 2023-08-15 DIAGNOSIS — Z7902 Long term (current) use of antithrombotics/antiplatelets: Secondary | ICD-10-CM

## 2023-08-15 DIAGNOSIS — Z7982 Long term (current) use of aspirin: Secondary | ICD-10-CM

## 2023-08-15 DIAGNOSIS — F10930 Alcohol use, unspecified with withdrawal, uncomplicated: Principal | ICD-10-CM

## 2023-08-15 DIAGNOSIS — E785 Hyperlipidemia, unspecified: Secondary | ICD-10-CM | POA: Diagnosis present

## 2023-08-15 DIAGNOSIS — R569 Unspecified convulsions: Secondary | ICD-10-CM | POA: Diagnosis not present

## 2023-08-15 DIAGNOSIS — E876 Hypokalemia: Secondary | ICD-10-CM | POA: Diagnosis present

## 2023-08-15 DIAGNOSIS — T466X6A Underdosing of antihyperlipidemic and antiarteriosclerotic drugs, initial encounter: Secondary | ICD-10-CM | POA: Diagnosis present

## 2023-08-15 DIAGNOSIS — I10 Essential (primary) hypertension: Secondary | ICD-10-CM | POA: Diagnosis present

## 2023-08-15 DIAGNOSIS — R7989 Other specified abnormal findings of blood chemistry: Secondary | ICD-10-CM | POA: Diagnosis not present

## 2023-08-15 DIAGNOSIS — Z1152 Encounter for screening for COVID-19: Secondary | ICD-10-CM | POA: Diagnosis not present

## 2023-08-15 DIAGNOSIS — F17210 Nicotine dependence, cigarettes, uncomplicated: Secondary | ICD-10-CM | POA: Diagnosis present

## 2023-08-15 DIAGNOSIS — G4089 Other seizures: Secondary | ICD-10-CM | POA: Diagnosis present

## 2023-08-15 LAB — BASIC METABOLIC PANEL
Anion gap: 13 (ref 5–15)
BUN: <5 mg/dL — ABNORMAL LOW (ref 8–23)
CO2: 23 mmol/L (ref 22–32)
Calcium: 8.9 mg/dL (ref 8.9–10.3)
Chloride: 93 mmol/L — ABNORMAL LOW (ref 98–111)
Creatinine, Ser: 0.78 mg/dL (ref 0.61–1.24)
GFR, Estimated: >60 mL/min (ref >60)
Glucose, Bld: 100 mg/dL — ABNORMAL HIGH (ref 70–99)
Potassium: 3.4 mmol/L — ABNORMAL LOW (ref 3.5–5.1)
Sodium: 129 mmol/L — ABNORMAL LOW (ref 135–145)

## 2023-08-15 LAB — URINALYSIS, COMPLETE (UACMP) WITH MICROSCOPIC
Bilirubin Urine: NEGATIVE
Glucose, UA: NEGATIVE mg/dL
Hgb urine dipstick: NEGATIVE
Ketones, ur: NEGATIVE mg/dL
Leukocytes,Ua: NEGATIVE
Nitrite: NEGATIVE
Protein, ur: NEGATIVE mg/dL
Specific Gravity, Urine: 1.006 (ref 1.005–1.030)
pH: 8 (ref 5.0–8.0)

## 2023-08-15 LAB — CBC WITH DIFFERENTIAL/PLATELET
Abs Immature Granulocytes: 0.03 10*3/uL (ref 0.00–0.07)
Basophils Absolute: 0 10*3/uL (ref 0.0–0.1)
Basophils Relative: 0 %
Eosinophils Absolute: 0 10*3/uL (ref 0.0–0.5)
Eosinophils Relative: 0 %
HCT: 35.6 % — ABNORMAL LOW (ref 39.0–52.0)
Hemoglobin: 12.2 g/dL — ABNORMAL LOW (ref 13.0–17.0)
Immature Granulocytes: 1 %
Lymphocytes Relative: 10 %
Lymphs Abs: 0.4 10*3/uL — ABNORMAL LOW (ref 0.7–4.0)
MCH: 32.2 pg (ref 26.0–34.0)
MCHC: 34.3 g/dL (ref 30.0–36.0)
MCV: 93.9 fL (ref 80.0–100.0)
Monocytes Absolute: 0.3 10*3/uL (ref 0.1–1.0)
Monocytes Relative: 7 %
Neutro Abs: 3.7 10*3/uL (ref 1.7–7.7)
Neutrophils Relative %: 82 %
Platelets: 38 10*3/uL — ABNORMAL LOW (ref 150–400)
RBC: 3.79 MIL/uL — ABNORMAL LOW (ref 4.22–5.81)
RDW: 14.6 % (ref 11.5–15.5)
WBC: 4.6 10*3/uL (ref 4.0–10.5)
nRBC: 0 % (ref 0.0–0.2)

## 2023-08-15 LAB — COMPREHENSIVE METABOLIC PANEL
ALT: 51 U/L — ABNORMAL HIGH (ref 0–44)
AST: 127 U/L — ABNORMAL HIGH (ref 15–41)
Albumin: 3.1 g/dL — ABNORMAL LOW (ref 3.5–5.0)
Alkaline Phosphatase: 67 U/L (ref 38–126)
Anion gap: 12 (ref 5–15)
BUN: <5 mg/dL — ABNORMAL LOW (ref 8–23)
CO2: 23 mmol/L (ref 22–32)
Calcium: 8.9 mg/dL (ref 8.9–10.3)
Chloride: 97 mmol/L — ABNORMAL LOW (ref 98–111)
Creatinine, Ser: 0.8 mg/dL (ref 0.61–1.24)
GFR, Estimated: >60 mL/min (ref >60)
Glucose, Bld: 140 mg/dL — ABNORMAL HIGH (ref 70–99)
Potassium: 4.1 mmol/L (ref 3.5–5.1)
Sodium: 132 mmol/L — ABNORMAL LOW (ref 135–145)
Total Bilirubin: 1 mg/dL (ref 0.3–1.2)
Total Protein: 7.1 g/dL (ref 6.5–8.1)

## 2023-08-15 LAB — RAPID URINE DRUG SCREEN, HOSP PERFORMED
Amphetamines: NOT DETECTED
Barbiturates: POSITIVE — AB
Benzodiazepines: NOT DETECTED
Cocaine: NOT DETECTED
Opiates: NOT DETECTED
Tetrahydrocannabinol: NOT DETECTED

## 2023-08-15 LAB — RETICULOCYTES
Immature Retic Fract: 11.2 % (ref 2.3–15.9)
RBC.: 3.88 MIL/uL — ABNORMAL LOW (ref 4.22–5.81)
Retic Count, Absolute: 34.9 10*3/uL (ref 19.0–186.0)
Retic Ct Pct: 0.9 % (ref 0.4–3.1)

## 2023-08-15 LAB — PROTIME-INR
INR: 1.2 (ref 0.8–1.2)
Prothrombin Time: 15.1 s (ref 11.4–15.2)

## 2023-08-15 LAB — CREATININE, URINE, RANDOM: Creatinine, Urine: 20 mg/dL

## 2023-08-15 LAB — OSMOLALITY, URINE: Osmolality, Ur: 351 mosm/kg (ref 300–900)

## 2023-08-15 LAB — OSMOLALITY: Osmolality: 273 mosm/kg — ABNORMAL LOW (ref 275–295)

## 2023-08-15 LAB — TSH: TSH: 0.53 u[IU]/mL (ref 0.350–4.500)

## 2023-08-15 LAB — SODIUM, URINE, RANDOM: Sodium, Ur: 147 mmol/L

## 2023-08-15 LAB — ETHANOL: Alcohol, Ethyl (B): <10 mg/dL (ref <10)

## 2023-08-15 LAB — AMMONIA: Ammonia: 39 umol/L — ABNORMAL HIGH (ref 9–35)

## 2023-08-15 MED ORDER — PHENOBARBITAL SODIUM 130 MG/ML IJ SOLN: 260.0000 mg | Freq: Once | INTRAMUSCULAR | Status: DC

## 2023-08-15 MED ORDER — PHENOBARBITAL SODIUM 130 MG/ML IJ SOLN: 130.0000 mg | INTRAMUSCULAR | Status: DC

## 2023-08-15 MED ORDER — SODIUM CHLORIDE 0.9 % IV SOLN: INTRAVENOUS | Status: AC

## 2023-08-15 MED ORDER — LACTATED RINGERS IV BOLUS
1000.0000 mL | Freq: Once | INTRAVENOUS | Status: AC
  Administered 2023-08-15: 1000 mL via INTRAVENOUS

## 2023-08-15 MED ORDER — LORAZEPAM 2 MG/ML IJ SOLN: 0.5000 mg | INTRAMUSCULAR | Status: DC | PRN

## 2023-08-15 MED ORDER — PHENOBARBITAL 32.4 MG PO TABS: 32.4000 mg | ORAL_TABLET | Freq: Two times a day (BID) | ORAL | Status: DC

## 2023-08-15 MED ORDER — PHENOBARBITAL 64.8 MG PO TABS: 64.8000 mg | ORAL_TABLET | Freq: Two times a day (BID) | ORAL | Status: DC

## 2023-08-15 MED ORDER — PHENOBARBITAL SODIUM 130 MG/ML IJ SOLN
130.0000 mg | INTRAMUSCULAR | Status: AC
  Administered 2023-08-15 (×2): 130 mg via INTRAVENOUS
  Filled 2023-08-15 (×2): qty 1

## 2023-08-15 MED ORDER — ATORVASTATIN CALCIUM 80 MG PO TABS: 80.0000 mg | ORAL_TABLET | Freq: Every day | ORAL | Status: DC

## 2023-08-15 MED ORDER — ONDANSETRON HCL 4 MG/2ML IJ SOLN: 4.0000 mg | Freq: Four times a day (QID) | INTRAMUSCULAR | Status: DC | PRN

## 2023-08-15 MED ORDER — PHENOBARBITAL 32.4 MG PO TABS
64.8000 mg | ORAL_TABLET | Freq: Two times a day (BID) | ORAL | Status: AC
  Administered 2023-08-16 – 2023-08-17 (×3): 64.8 mg via ORAL
  Filled 2023-08-15 (×3): qty 2

## 2023-08-15 MED ORDER — ACETAMINOPHEN 650 MG RE SUPP: 650.0000 mg | Freq: Four times a day (QID) | RECTAL | Status: DC | PRN

## 2023-08-15 MED ORDER — ACETAMINOPHEN 325 MG PO TABS
650.0000 mg | ORAL_TABLET | Freq: Four times a day (QID) | ORAL | Status: DC | PRN
  Administered 2023-08-18: 650 mg via ORAL
  Filled 2023-08-15: qty 2

## 2023-08-15 MED ORDER — PHENOBARBITAL 32.4 MG PO TABS
32.4000 mg | ORAL_TABLET | Freq: Two times a day (BID) | ORAL | Status: AC
  Administered 2023-08-18 – 2023-08-19 (×4): 32.4 mg via ORAL
  Filled 2023-08-15 (×4): qty 1

## 2023-08-15 MED ORDER — TAMSULOSIN HCL 0.4 MG PO CAPS
0.4000 mg | ORAL_CAPSULE | Freq: Every day | ORAL | Status: DC
  Administered 2023-08-15 – 2023-08-20 (×6): 0.4 mg via ORAL
  Filled 2023-08-15 (×6): qty 1

## 2023-08-15 MED ORDER — ONDANSETRON HCL 4 MG PO TABS: 4.0000 mg | ORAL_TABLET | Freq: Four times a day (QID) | ORAL | Status: DC | PRN

## 2023-08-15 MED ORDER — HYDROCODONE-ACETAMINOPHEN 5-325 MG PO TABS: 1.0000 | ORAL_TABLET | ORAL | Status: DC | PRN

## 2023-08-15 MED ORDER — PHENOBARBITAL SODIUM 130 MG/ML IJ SOLN
130.0000 mg | INTRAMUSCULAR | Status: AC
  Administered 2023-08-16: 130 mg via INTRAVENOUS
  Filled 2023-08-15: qty 1

## 2023-08-15 NOTE — ED Notes (Signed)
Per lab, they did not receive the bloodwork that was collected and sent down at 1547. Bloodwork re-drawn and sent to lab.

## 2023-08-15 NOTE — ED Triage Notes (Signed)
Patient presents via EMS from home for two reported seizures. Per EMS, patient drinks 1/5 of liquor plus several beers daily but stopped drinking completely on Friday. Per EMS, patient was laying on a bed and did not sustain any trauma. Upon arrival, patient is alert and oriented only to self. Per EMS, patient was incontinent of urine upon their arrival to patient. Patient received approximately LR en route from EMS.

## 2023-08-15 NOTE — Assessment & Plan Note (Addendum)
Admit to progressive seizure precautions Per family patient have had seizures in the past likely associated with alcohol withdrawal. For ablation obtain CT head which was nonacute

## 2023-08-15 NOTE — Progress Notes (Addendum)
MEDICATION RELATED CONSULT NOTE - INITIAL   Pharmacy Consult for Phenobarbital Indication: AWS  Allergies  Allergen Reactions   Penicillins Nausea And Vomiting    Patient Measurements: Height: 5\' 11"  (180.3 cm) Weight: 70.3 kg (155 lb) IBW/kg (Calculated) : 75.3  Vital Signs: Temp: 98.5 F (36.9 C) (10/13 1532) Temp Source: Oral (10/13 1532) BP: 120/77 (10/13 1645) Pulse Rate: 102 (10/13 1645) Intake/Output from previous day: No intake/output data recorded. Intake/Output from this shift: Total I/O In: 600 [I.V.:600] Out: -   Labs: No results for input(s): "WBC", "HGB", "HCT", "PLT", "APTT", "CREATININE", "LABCREA", "CREAT24HRUR", "MG", "PHOS", "ALBUMIN", "PROT", "AST", "ALT", "ALKPHOS", "BILITOT", "BILIDIR", "IBILI" in the last 72 hours. CrCl cannot be calculated (Patient's most recent lab result is older than the maximum 21 days allowed.).   Microbiology: No results found for this or any previous visit (from the past 720 hour(s)).  Medical History: Past Medical History:  Diagnosis Date   BPH (benign prostatic hyperplasia)    Heart disease    Medical history non-contributory    Migraine    Myocardial infarction acute [facility])    Assessment: 37 yom presenting with AWS. Reported history of 30 drinks a day.   Plan:  Per discussion with ED MD will initiate phenobarbital dosing for AWS as outlined below:  Give just under 15mg /kg loading dose divided into 3 parts. Dose 1: Patient has already received 520 mg IV phenobarbital total since arrival to ED. Dose 2: Will give 260mg  IV phenobarbital 3 hours following dose 1. Dose 3: 260 mg IV phenobarbital 3 hours after dose 2. Note: given IV fluid shortage will utilize 130mg  slow IV pushes separated by 10 minutes for each dose to reduce IV fluid usage.  Day 2-5 Recommendations (should be given 8 hours after LD complete): Day 2-3: Phenobarbital 64.8 mg IM/IV/PO every 12 hours for four doses  Day 4-5: Phenobarbital 32.4 mg  IM/IV/PO every 12 hours for four doses   Monitoring:  - BP, HR, Resp Status - Hold if BP <90/50 mm Hg, HR <50 bpm, RR <10 bpm, or RASS score <-2. - CIWA (non-critical care units) and MINDS (critical care) can be used to assess patient's withdrawal severity but not to determine dosing during the use of phenobarbital.   Delmar Landau, PharmD, BCPS 08/15/2023 5:01 PM ED Clinical Pharmacist -  [phone]

## 2023-08-15 NOTE — ED Provider Notes (Signed)
[place] EMERGENCY DEPARTMENT AT [facility] Provider Note   CSN: [id number] Arrival date & time: 08/15/23  1524     History Chief Complaint  Patient presents with   seizure/withdrawal   Seizures    HPI William Baxter is a 62 y.o. male presenting for alcohol withdrawals.  History of 20 drinks per day for years.  Quit cold [place] on Friday.  Family noted 2 seizures today with generalized tonic-clonic activity.  EMS did not have any further seizures glucose within normal limits for them.  Still not back to baseline at this time.   Patient's recorded medical, surgical, social, medication list and allergies were reviewed in the Snapshot window as part of the initial history.   Review of Systems   Review of Systems  Physical Exam Updated Vital Signs BP 122/74   Pulse 83   Temp 98.5 F (36.9 C) (Oral)   Resp 17   Ht 5\' 11"  (1.803 m)   Wt 70.3 kg   SpO2 97%   BMI 21.62 kg/m  Physical Exam   ED Course/ Medical Decision Making/ A&P    Procedures .Critical Care  Performed by: Glyn Ade, MD Authorized by: Glyn Ade, MD   Critical care provider statement:    Critical care time (minutes):  95   Critical care was necessary to treat or prevent imminent or life-threatening deterioration of the following conditions:  Dehydration and CNS failure or compromise   Critical care was time spent personally by me on the following activities:  Development of treatment plan with patient or surrogate, discussions with consultants, evaluation of patient's response to treatment, examination of patient, ordering and review of laboratory studies, ordering and review of radiographic studies, ordering and performing treatments and interventions, pulse oximetry, re-evaluation of patient's condition and review of old charts    Medications Ordered in ED Medications  PHENObarbital (LUMINAL) injection 130 mg (has no administration in time range)    Followed by   PHENObarbital (LUMINAL) injection 130 mg (has no administration in time range)  PHENObarbital (LUMINAL) injection 130 mg (130 mg Intravenous Given 08/15/23 1550)  lactated ringers bolus 1,000 mL (0 mLs Intravenous Stopped 08/15/23 1658)  PHENObarbital (LUMINAL) injection 130 mg (130 mg Intravenous Given 08/15/23 1655)    Medical Decision Making:    William Baxter is a 62 y.o. male who presented to the ED today with seizure episodes detailed above.     Complete initial physical exam performed, notably the patient  was hemodynamically stable no acute distress.  Initial GCS 14.  He was quite tachycardic and tachypneic agitated on arrival.  CIWA approximately 24.      Reviewed and confirmed nursing documentation for past medical history, family history, social history.    Initial Assessment:   This is most consistent with an acute life/limb threatening illness complicated by underlying chronic conditions. This is a critically ill male presenting with severe alcohol withdrawals already status post seizures x 2 without return to baseline.  Given severity of presentation we will aggressively treat.  Loaded with 6 mg/kg of IV phenobarbital in the emergency room and observed for 4 hours.  He is clinically improved dramatically since his arrival.  Heart rate has decreased to 88 3, CIWA downtrending behavior improving agitation improving and he wants to trial p.o. intake now.   Consults:  Case discussed with pharmacy.  Phenobarb loading protocol. 15 mg/kg^ Total Loading Dose (LD) given as:  LD part 1 (40% of total): 6 mg/kg IM/IV immediately  LD part 2 (30% of total): 4.5 mg/kg IM/IV given 3 hours later  LD part 3 (30% of total): 4.5 mg/kg IM/IV given 3 hours later Reassessment and Plan:   Based on this presentation, patient appears to be stabilizing after extensive observation and workup in the emergency room. Given that he is stabilizing, medicine was consulted for admission.  They will  evaluate at bedside.  Disposition:   Based on the above findings, I believe this patient is stable for admission.    Patient/family educated about specific findings on our evaluation and explained exact reasons for admission.  Patient/family educated about clinical situation and time was allowed to answer questions.   Admission team communicated with and agreed with need for admission. Patient admitted. Patient  ready to move at this time.     Emergency Department Medication Summary:   Medications  PHENObarbital (LUMINAL) injection 130 mg (has no administration in time range)    Followed by  PHENObarbital (LUMINAL) injection 130 mg (has no administration in time range)  PHENObarbital (LUMINAL) injection 130 mg (130 mg Intravenous Given 08/15/23 1550)  lactated ringers bolus 1,000 mL (0 mLs Intravenous Stopped 08/15/23 1658)  PHENObarbital (LUMINAL) injection 130 mg (130 mg Intravenous Given 08/15/23 1655)         Clinical Impression:  1. Alcohol withdrawal syndrome without complication (HCC)      Admit   Final Clinical Impression(s) / ED Diagnoses Final diagnoses:  Alcohol withdrawal syndrome without complication [facility])    Rx / DC Orders ED Discharge Orders     None         Glyn Ade, MD 08/15/23 1911

## 2023-08-15 NOTE — Assessment & Plan Note (Signed)
In the setting of heavy ETOH abuse

## 2023-08-15 NOTE — Subjective & Objective (Signed)
Patient with history of heavy alcohol abuse drinks about 20 drinks per day attempted to quit today had 2 seizures witnessed by family tonic-clonic patient did not come back to baseline in between.  EMS arrived CBG unremarkable Patient brought into emergency department where he received 2 doses of phenobarbital with some improvement

## 2023-08-15 NOTE — H&P (Signed)
William Baxter BJY:[id number] DOB: [birth year] DOA: 08/15/2023     PCP: [facility]   Outpatient Specialists:  CARDS:   [facility]   Patient arrived to ER on 08/15/23 at 1524 Referred by Attending Therisa Doyne, MD   Patient coming from:    home Lives   WithSO     Chief Complaint:   Chief Complaint  Patient presents with   seizure/withdrawal   Seizures    HPI: William Baxter is a 62 y.o. male with medical history significant of CAD, heavy alcohol abuse, BPH, history migraine    Presented with alcohol withdrawal seizures Patient with history of heavy alcohol abuse drinks about 20 drinks per day attempted to quit today had 2 seizures witnessed by family tonic-clonic patient did not come back to baseline in between.  EMS arrived CBG unremarkable Patient brought into emergency department where he received 2 doses of phenobarbital with some improvement Family states he had a seizure few month ago famiy nsure why    significant ETOH intake    Smokes on occasion   Lab Results  Component Value Date   SARSCOV2NAA NEGATIVE 04/01/2022   SARSCOV2NAA NEGATIVE 07/06/2021      Regarding pertinent Chronic problems:     Hyperlipidemia -  on statins Lipitor (atorvastatin) noncompliant Lipid Panel     Component Value Date/Time   CHOL 176 12/16/2013 0301   TRIG 43 12/16/2013 0301   HDL 77 12/16/2013 0301   CHOLHDL 2.3 12/16/2013 0301   VLDL 9 12/16/2013 0301   LDLCALC 90 12/16/2013 0301       CAD  - On Aspirin, statin,   Plavix not compliant                -  followed by cardiology has not seen them for a while                    BPH - on Flomax,      Chronic anemia - baseline hg Hemoglobin & Hematocrit  Recent Labs    08/15/23 1800  HGB 12.2*     While in ER:   Started phenobarbital per ER discussed with pharmacy     Lab Orders         CBC with Differential         Comprehensive metabolic panel         Protime-INR          Rapid urine drug screen (hospital performed)         Ammonia         Magnesium         Phosphorus      CT HEAD   NON acute     CXR -  NON acute Right upper quadrant ultrasound hepatic steatosis Following Medications were ordered in ER: Medications  PHENObarbital (LUMINAL) injection 130 mg (has no administration in time range)    Followed by  PHENObarbital (LUMINAL) injection 130 mg (has no administration in time range)  PHENObarbital (LUMINAL) injection 130 mg (130 mg Intravenous Given 08/15/23 1550)  lactated ringers bolus 1,000 mL (0 mLs Intravenous Stopped 08/15/23 1658)  PHENObarbital (LUMINAL) injection 130 mg (130 mg Intravenous Given 08/15/23 1655)       ED Triage Vitals  Encounter Vitals Group     BP 08/15/23 1532 (!) 89/59     Systolic BP Percentile --      Diastolic BP Percentile --  Pulse Rate 08/15/23 1532 (!) 123     Resp 08/15/23 1532 18     Temp 08/15/23 1532 98.5 F (36.9 C)     Temp Source 08/15/23 1532 Oral     SpO2 08/15/23 1528 98 %     Weight 08/15/23 1541 155 lb (70.3 kg)     Height 08/15/23 1541 5\' 11"  (1.803 m)     Head Circumference --      Peak Flow --      Pain Score 08/15/23 1532 0     Pain Loc --      Pain Education --      Exclude from Growth Chart --   ZOXW(96)@     _________________________________________ Significant initial  Findings: Abnormal Labs Reviewed  CBC WITH DIFFERENTIAL/PLATELET - Abnormal; Notable for the following components:      Result Value   RBC 3.79 (*)    Hemoglobin 12.2 (*)    HCT 35.6 (*)    Platelets 38 (*)    Lymphs Abs 0.4 (*)    All other components within normal limits  COMPREHENSIVE METABOLIC PANEL - Abnormal; Notable for the following components:   Sodium 132 (*)    Chloride 97 (*)    Glucose, Bld 140 (*)    BUN <5 (*)    Albumin 3.1 (*)    AST 127 (*)    ALT 51 (*)    All other components within normal limits  RAPID URINE DRUG SCREEN, HOSP PERFORMED - Abnormal; Notable for the following  components:   Barbiturates POSITIVE (*)    All other components within normal limits         ECG: Ordered Personally reviewed and interpreted by me showing: HR : 125 Rhythm: Sinus tachycardia Borderline right axis deviation Probable anteroseptal infarct, old Minimal ST depression, inferior leads QTC 428     COVID-19 Labs  No results for input(s): "DDIMER", "FERRITIN", "LDH", "CRP" in the last 72 hours.  Lab Results  Component Value Date   SARSCOV2NAA NEGATIVE 04/01/2022   SARSCOV2NAA NEGATIVE 07/06/2021      The recent clinical data is shown below. Vitals:   08/15/23 1645 08/15/23 1800 08/15/23 1815 08/15/23 1845  BP: 120/77 115/80 121/82 122/74  Pulse: (!) 102 94 87 83  Resp: 17 18 15 17   Temp:      TempSrc:      SpO2: 98% 100% 98% 97%  Weight:      Height:        WBC     Component Value Date/Time   WBC 4.6 08/15/2023 1800   LYMPHSABS 0.4 (L) 08/15/2023 1800   MONOABS 0.3 08/15/2023 1800   EOSABS 0.0 08/15/2023 1800   BASOSABS 0.0 08/15/2023 1800      UA   ordered     Results for orders placed or performed during the hospital encounter of 09/15/22  Urine Culture     Status: None   Collection Time: 09/15/22  3:28 PM   Specimen: Urine, Clean Catch  Result Value Ref Range Status   Specimen Description URINE, CLEAN CATCH  Final   Special Requests NONE  Final   Culture   Final    NO GROWTH Performed at [facility], [address], [place], [place] [address]    Report Status 09/17/2022 FINAL  Final    __________________________________________________________ Recent Labs  Lab 08/15/23 1800  NA 132*  K 4.1  CO2 23  GLUCOSE 140*  BUN <5*  CREATININE 0.80  CALCIUM  8.9    Cr   stable,   Lab Results  Component Value Date   CREATININE 0.80 08/15/2023   CREATININE 0.65 04/03/2022   CREATININE 1.10 04/01/2022    Recent Labs  Lab 08/15/23 1800  AST 127*  ALT 51*  ALKPHOS 67  BILITOT 1.0  PROT 7.1  ALBUMIN 3.1*   Lab Results   Component Value Date   CALCIUM 8.9 08/15/2023    Plt: Lab Results  Component Value Date   PLT 38 (L) 08/15/2023       Recent Labs  Lab 08/15/23 1800  WBC 4.6  NEUTROABS 3.7  HGB 12.2*  HCT 35.6*  MCV 93.9  PLT 38*    HG/HCT stable     Component Value Date/Time   HGB 12.2 (L) 08/15/2023 1800   HCT 35.6 (L) 08/15/2023 1800   MCV 93.9 08/15/2023 1800      Recent Labs  Lab 08/15/23 1918  AMMONIA 39*     _______________________________________________ Hospitalist was called for admission for   Alcohol withdrawal syndrome without complication    The following Work up has been ordered so far:  Orders Placed This Encounter  Procedures   Critical Care   CBC with Differential   Comprehensive metabolic panel   Protime-INR   Rapid urine drug screen (hospital performed)   Ammonia   Magnesium   Phosphorus   Clinical institute withdrawal assessment   Cardiac Monitoring - Continuous Indefinite   Consult for [facility]mission   ED EKG   EKG 12-Lead   Admit to Inpatient (patient's expected length of stay will be greater than 2 midnights or inpatient only procedure)     OTHER Significant initial  Findings:  labs showing:     DM  labs:  HbA1C: No results for input(s): "HGBA1C" in the last 8760 hours.     CBG (last 3)  No results for input(s): "GLUCAP" in the last 72 hours.        Cultures:    Component Value Date/Time   SDES URINE, CLEAN CATCH 09/15/2022 1528   SPECREQUEST NONE 09/15/2022 1528   CULT  09/15/2022 1528    NO GROWTH Performed at [facility], [address], [place], [place] [address]    REPTSTATUS 09/17/2022 FINAL 09/15/2022 1528     Radiological Exams on Admission: CT HEAD WO CONTRAST ( )  Result Date: 08/15/2023 CLINICAL DATA:  Seizure EXAM: CT HEAD WITHOUT CONTRAST TECHNIQUE: Contiguous axial images were obtained from the base of the skull through the vertex without intravenous contrast. RADIATION DOSE  REDUCTION: This exam was performed according to the departmental dose-optimization program which includes automated exposure control, adjustment of the mA and/or kV according to patient size and/or use of iterative reconstruction technique. COMPARISON:  12/30/2016 FINDINGS: Brain: There is no mass, hemorrhage or extra-axial collection. The appearance of the white matter is normal for the patient's age. There is generalized atrophy. Vascular: Atherosclerotic calcification of the internal carotid arteries at the skull base. No abnormal hyperdensity of the major intracranial arteries or dural venous sinuses. Skull: The visualized skull base, calvarium and extracranial soft tissues are normal. Sinuses/Orbits: Small amount of right mastoid fluid. Minimal left sphenoid sinus mucosal thickening. Normal orbits. IMPRESSION: 1. No acute intracranial abnormality. 2. Generalized atrophy. 3. Small amount of right mastoid fluid. Electronically Signed   By: Deatra Robinson M.D.   On: 08/15/2023 21:22   US Abdomen Limited RUQ (LIVER/GB)  Result Date: 08/15/2023 CLINICAL DATA:  Elevated liver function tests. EXAM:  ULTRASOUND ABDOMEN LIMITED RIGHT UPPER QUADRANT COMPARISON:  None Available. FINDINGS: Gallbladder: No gallstones or wall thickening visualized (1.2 mm). No sonographic Murphy sign noted by sonographer. Common bile duct: Diameter: 4.3 mm Liver: No focal lesion identified. Diffusely increased echogenicity of the liver parenchyma is noted. Portal vein is patent on color Doppler imaging with normal direction of blood flow towards the liver. Other: None. IMPRESSION: Hepatic steatosis without focal liver lesions. Electronically Signed   By: Aram Candela M.D.   On: 08/15/2023 20:40   DG CHEST PORT 1 VIEW  Result Date: 08/15/2023 CLINICAL DATA:  Dyspnea EXAM: PORTABLE CHEST 1 VIEW COMPARISON:  02/06/2020 FINDINGS: Normal cardiac size. No focal opacity or pleural effusion. No pneumothorax. IMPRESSION: No active  disease. Electronically Signed   By: Jasmine Pang M.D.   On: 08/15/2023 20:28   _______________________________________________________________________________________________________ Latest  Blood pressure 122/74, pulse 83, temperature 98.5 F (36.9 C), temperature source Oral, resp. rate 17, height 5\' 11"  (1.803 m), weight 70.3 kg, SpO2 97%.   Vitals  labs and radiology finding personally reviewed  Review of Systems:    Pertinent positives include: seizure  Constitutional:  No weight loss, night sweats, Fevers, chills, fatigue, weight loss  HEENT:  No headaches, Difficulty swallowing,Tooth/dental problems,Sore throat,  No sneezing, itching, ear ache, nasal congestion, post nasal drip,  Cardio-vascular:  No chest pain, Orthopnea, PND, anasarca, dizziness, palpitations.no Bilateral lower extremity swelling  GI:  No heartburn, indigestion, abdominal pain, nausea, vomiting, diarrhea, change in bowel habits, loss of appetite, melena, blood in stool, hematemesis Resp:  no shortness of breath at rest. No dyspnea on exertion, No excess mucus, no productive cough, No non-productive cough, No coughing up of blood.No change in color of mucus.No wheezing. Skin:  no rash or lesions. No jaundice GU:  no dysuria, change in color of urine, no urgency or frequency. No straining to urinate.  No flank pain.  Musculoskeletal:  No joint pain or no joint swelling. No decreased range of motion. No back pain.  Psych:  No change in mood or affect. No depression or anxiety. No memory loss.  Neuro: no localizing neurological complaints, no tingling, no weakness, no double vision, no gait abnormality, no slurred speech, no confusion  All systems reviewed and apart from HOPI all are negative _______________________________________________________________________________________________ Past Medical History:   Past Medical History:  Diagnosis Date   BPH (benign prostatic hyperplasia)    Heart disease     Medical history non-contributory    Migraine    Myocardial infarction acute [facility])       Past Surgical History:  Procedure Laterality Date   CARDIAC CATHETERIZATION      Social History:  Ambulatory   independently      reports that he has been smoking cigarettes. He has never used smokeless tobacco. He reports current alcohol use. He reports that he does not use drugs.     Family History:   History reviewed. No pertinent family history. ______________________________________________________________________________________________ Allergies: Allergies  Allergen Reactions   Penicillins Nausea And Vomiting     Prior to Admission medications   Medication Sig Start Date End Date Taking? Authorizing Provider  acetaminophen (TYLENOL) 325 MG tablet Take 325 mg by mouth every 6 (six) hours as needed for mild pain.    Yes [provider]  albuterol (PROVENTIL HFA;VENTOLIN HFA) 108 (90 Base) MCG/ACT inhaler Inhale 2 puffs into the lungs every 6 (six) hours as needed for wheezing or shortness of breath. 06/15/16  Yes Joseph Art, DO  aspirin EC 81 MG EC tablet Take 1 tablet (81 mg total) by mouth daily. 12/19/13  Yes Orpah Cobb, MD  atorvastatin (LIPITOR) 80 MG tablet Take 1 tablet (80 mg total) by mouth daily at 6 PM. 12/19/13  Yes Orpah Cobb, MD  clopidogrel (PLAVIX) 75 MG tablet Take 1 tablet (75 mg total) by mouth daily with breakfast. 12/19/13  Yes Orpah Cobb, MD  etodolac (LODINE) 300 MG capsule Take 1 capsule (300 mg total) by mouth every 8 (eight) hours. 01/19/20  Yes Linwood Dibbles, MD  isosorbide mononitrate (IMDUR) 60 MG 24 hr tablet Take 60 mg by mouth daily.   Yes [provider]  metoprolol (LOPRESSOR) 25 MG tablet Take 1 tablet (25 mg total) by mouth 2 (two) times daily. 06/15/16  Yes Joseph Art, DO  nitroGLYCERIN (NITROSTAT) 0.4 MG SL tablet Place 1 tablet (0.4 mg total) under the tongue every 5 (five) minutes as needed for chest pain. 12/19/13  Yes  Orpah Cobb, MD  polyvinyl alcohol (LIQUIFILM TEARS) 1.4 % ophthalmic solution Place 1 drop into both eyes as needed for dry eyes.   Yes [provider]  tamsulosin (FLOMAX) 0.4 MG CAPS capsule Take 0.4 mg by mouth daily.   Yes [provider]  permethrin (ELIMITE) 5 % cream Apply Head to toe and wash off after 8 hours Patient not taking: Reported on 08/15/2023 03/11/17   Charlestine Night, PA-C    ___________________________________________________________________________________________________ Physical Exam:    08/15/2023    6:45 PM 08/15/2023    6:15 PM 08/15/2023    6:00 PM  Vitals with BMI  Systolic 122 121 782  Diastolic 74 82 80  Pulse 83 87 94     1. General:  in No  Acute distress   Chronically ill   -appearing 2. Psychological: Alert and   Oriented 3. Head/ENT:    Dry Mucous Membranes                          Head Non traumatic, neck supple                         Poor Dentition 4. SKIN:  decreased Skin turgor,  Skin clean Dry and intact no rash    5. Heart: Regular rate and rhythm no  Murmur, no Rub or gallop 6. Lungs:  no wheezes or crackles   7. Abdomen: Soft,  non-tender, Non distended bowel sounds present 8. Lower extremities: no clubbing, cyanosis, no  edema 9. Neurologically Grossly intact, moving all 4 extremities equally minimally tremulous 10. MSK: Normal range of motion    Chart has been reviewed  ______________________________________________________________________________________________  Assessment/Plan  62 y.o. male with medical history significant of CAD, heavy alcohol abuse, BPH, history migraine   Admitted for   Alcohol withdrawal syndrome alcohol withdrawal seizures  Present on Admission:  Alcohol withdrawal (HCC)  Thrombocytopenia (HCC)  Elevated LFTs     Alcohol withdrawal (HCC) CIWA monitoring Pt responded well to phenobrbital in ER Will continue per pharmacy would need to avoid benzodiazepines for  tonight Tomorrow can start on PRN 0.5 mg as needed Appreciate pharmacy consult for phenobarbital protocol     Alcohol withdrawal seizure (HCC) Admit to progressive seizure precautions Per family patient have had seizures in the past likely associated with alcohol withdrawal. For ablation obtain CT head which was nonacute   Thrombocytopenia (HCC) In the setting of heavy ETOH abuse  CAD S/P percutaneous  coronary angioplasty Patient no longer compliant with aspirin and Plavix or Lipitor Hold off for tonight as patient is at risk of falls Given history of heavy alcohol abuse would need to have discussion prior to discharge to see if that could be safely restarted  Elevated LFTs In the setting of alcohol use hold off on statin for tonight check hepatitis serologies Ultrasound showed only hepatic steatosis no focal liver lesions    Other plan as per orders.  DVT prophylaxis:  SCD      Code Status:    Code Status: Prior FULL COD as per patient   I had personally discussed CODE STATUS with patient   ACP   none    Family Communication:   Family not at  Bedside  plan of care was discussed w SO    Diet  heart healthy   Disposition Plan:       To home once workup is complete and patient is stable   Following barriers for discharge:                                                      Electrolytes corrected                               Anemia stable                              Alcohol withdrawal resolved                    Would benefit from PT/OT eval prior to DC  Ordered                                       Transition of care consulted                   Nutrition    consulted                                  Consults called: None pharmacy is   Admission status:  ED Disposition     ED Disposition  Admit   Condition  --   Comment  Hospital [facility] [[id number]]  Level of Care: Progressive [102]  Admit to Progressive based on following  criteria: NEUROLOGICAL AND NEUROSURGICAL complex patients with significant risk of instability, who do not meet ICU criteria, yet require close observation or frequent assessment (< / = every 2 - 4 hours) with medical / nursing intervention.  May admit patient to [facility] or [facility] if equivalent level of care is available:: No  Covid Evaluation: Asymptomatic - no recent exposure (last 10 days) testing not required  Diagnosis: Alcohol withdrawal (HCC) [291.81.ICD-9-CM]  Admitting Physician: Therisa Doyne [[id number]]  Attending Physician: Therisa Doyne [[id number]]  Certification:: I certify this patient will need inpatient services for at least 2 midnights  Expected Medical Readiness: 08/18/2023               inpatient     I Expect 2 midnight stay secondary  to severity of patient's current illness need for inpatient interventions justified by the following:  hemodynamic instability despite optimal treatment (tachycardia  hypotension )  Severe lab/radiological/exam abnormalities including:    seizures and extensive comorbidities including:  substance abuse    That are currently affecting medical management.   I expect  patient to be hospitalized for 2 midnights requiring inpatient medical care.  Patient is at high risk for adverse outcome (such as loss of life or disability) if not treated.  Indication for inpatient stay as follows:  Severe change from baseline regarding mental status   Need for IV antibiotics, IV fluids,    Level of care    progressive     stepdown   tele indefinitely please discontinue once patient no longer qualifies COVID-19 Labs    Paz Fuentes 08/15/2023, 9:38 PM    [facility]     after 2 AM please page floor coverage PA If 7AM-7PM, please contact the day team taking care of the patient using [email or web address]

## 2023-08-15 NOTE — Assessment & Plan Note (Addendum)
CIWA monitoring Pt responded well to phenobrbital in ER Will continue per pharmacy would need to avoid benzodiazepines for tonight Tomorrow can start on PRN 0.5 mg as needed Appreciate pharmacy consult for phenobarbital protocol

## 2023-08-15 NOTE — Assessment & Plan Note (Signed)
Patient no longer compliant with aspirin and Plavix or Lipitor Hold off for tonight as patient is at risk of falls Given history of heavy alcohol abuse would need to have discussion prior to discharge to see if that could be safely restarted

## 2023-08-15 NOTE — Assessment & Plan Note (Signed)
In the setting of alcohol use hold off on statin for tonight check hepatitis serologies Ultrasound showed only hepatic steatosis no focal liver lesions

## 2023-08-16 DIAGNOSIS — F10930 Alcohol use, unspecified with withdrawal, uncomplicated: Secondary | ICD-10-CM | POA: Diagnosis not present

## 2023-08-16 DIAGNOSIS — E871 Hypo-osmolality and hyponatremia: Secondary | ICD-10-CM | POA: Diagnosis not present

## 2023-08-16 DIAGNOSIS — F10931 Alcohol use, unspecified with withdrawal delirium: Secondary | ICD-10-CM | POA: Diagnosis not present

## 2023-08-16 DIAGNOSIS — R7989 Other specified abnormal findings of blood chemistry: Secondary | ICD-10-CM | POA: Diagnosis not present

## 2023-08-16 LAB — IRON AND TIBC
Iron: 148 ug/dL (ref 45–182)
Saturation Ratios: 51 % — ABNORMAL HIGH (ref 17.9–39.5)
TIBC: 293 ug/dL (ref 250–450)
UIBC: 145 ug/dL

## 2023-08-16 LAB — HEPATITIS PANEL, ACUTE
HCV Ab: NONREACTIVE
Hep A IgM: NONREACTIVE
Hep B C IgM: NONREACTIVE
Hepatitis B Surface Ag: NONREACTIVE

## 2023-08-16 LAB — CBC
HCT: 37.3 % — ABNORMAL LOW (ref 39.0–52.0)
Hemoglobin: 13.3 g/dL (ref 13.0–17.0)
MCH: 33.6 pg (ref 26.0–34.0)
MCHC: 35.7 g/dL (ref 30.0–36.0)
MCV: 94.2 fL (ref 80.0–100.0)
Platelets: 36 10*3/uL — ABNORMAL LOW (ref 150–400)
RBC: 3.96 MIL/uL — ABNORMAL LOW (ref 4.22–5.81)
RDW: 14.2 % (ref 11.5–15.5)
WBC: 3.2 10*3/uL — ABNORMAL LOW (ref 4.0–10.5)
nRBC: 0 % (ref 0.0–0.2)

## 2023-08-16 LAB — COMPREHENSIVE METABOLIC PANEL
ALT: 47 U/L — ABNORMAL HIGH (ref 0–44)
AST: 110 U/L — ABNORMAL HIGH (ref 15–41)
Albumin: 3.6 g/dL (ref 3.5–5.0)
Alkaline Phosphatase: 59 U/L (ref 38–126)
Anion gap: 14 (ref 5–15)
BUN: <5 mg/dL — ABNORMAL LOW (ref 8–23)
CO2: 22 mmol/L (ref 22–32)
Calcium: 9.1 mg/dL (ref 8.9–10.3)
Chloride: 95 mmol/L — ABNORMAL LOW (ref 98–111)
Creatinine, Ser: 0.72 mg/dL (ref 0.61–1.24)
GFR, Estimated: >60 mL/min (ref >60)
Glucose, Bld: 93 mg/dL (ref 70–99)
Potassium: 3.4 mmol/L — ABNORMAL LOW (ref 3.5–5.1)
Sodium: 131 mmol/L — ABNORMAL LOW (ref 135–145)
Total Bilirubin: 1.3 mg/dL — ABNORMAL HIGH (ref 0.3–1.2)
Total Protein: 7.6 g/dL (ref 6.5–8.1)

## 2023-08-16 LAB — PHOSPHORUS
Phosphorus: 2.9 mg/dL (ref 2.5–4.6)
Phosphorus: 2.5 mg/dL (ref 2.5–4.6)

## 2023-08-16 LAB — MAGNESIUM
Magnesium: 1.9 mg/dL (ref 1.7–2.4)
Magnesium: 1.7 mg/dL (ref 1.7–2.4)

## 2023-08-16 LAB — PREALBUMIN: Prealbumin: 20 mg/dL (ref 18–38)

## 2023-08-16 LAB — FERRITIN: Ferritin: 1400 ng/mL — ABNORMAL HIGH (ref 24–336)

## 2023-08-16 LAB — HIV ANTIBODY (ROUTINE TESTING W REFLEX): HIV Screen 4th Generation wRfx: NONREACTIVE

## 2023-08-16 LAB — FOLATE: Folate: 7.7 ng/mL (ref >5.9)

## 2023-08-16 LAB — CK: Total CK: 726 U/L — ABNORMAL HIGH (ref 49–397)

## 2023-08-16 LAB — VITAMIN B12: Vitamin B-12: 694 pg/mL (ref 180–914)

## 2023-08-16 MED ORDER — THIAMINE MONONITRATE 100 MG PO TABS
100.0000 mg | ORAL_TABLET | Freq: Every day | ORAL | Status: DC
  Administered 2023-08-16 – 2023-08-17 (×2): 100 mg via ORAL
  Filled 2023-08-16 (×3): qty 1

## 2023-08-16 MED ORDER — LORAZEPAM 2 MG/ML IJ SOLN
1.0000 mg | INTRAMUSCULAR | Status: AC | PRN
  Administered 2023-08-16 (×2): 1 mg via INTRAVENOUS
  Administered 2023-08-16 (×2): 2 mg via INTRAVENOUS
  Filled 2023-08-16 (×4): qty 1

## 2023-08-16 MED ORDER — ENSURE ENLIVE PO LIQD
237.0000 mL | Freq: Two times a day (BID) | ORAL | Status: DC
  Administered 2023-08-17 – 2023-08-20 (×7): 237 mL via ORAL

## 2023-08-16 MED ORDER — LORAZEPAM 1 MG PO TABS: 1.0000 mg | ORAL_TABLET | ORAL | Status: AC | PRN

## 2023-08-16 MED ORDER — POTASSIUM CHLORIDE 10 MEQ/100ML IV SOLN
10.0000 meq | INTRAVENOUS | Status: AC
  Administered 2023-08-16 (×3): 10 meq via INTRAVENOUS
  Filled 2023-08-16 (×3): qty 100

## 2023-08-16 MED ORDER — FOLIC ACID 1 MG PO TABS
1.0000 mg | ORAL_TABLET | Freq: Every day | ORAL | Status: DC
  Administered 2023-08-16 – 2023-08-20 (×5): 1 mg via ORAL
  Filled 2023-08-16 (×5): qty 1

## 2023-08-16 MED ORDER — THIAMINE HCL 100 MG/ML IJ SOLN
100.0000 mg | Freq: Every day | INTRAMUSCULAR | Status: DC
  Filled 2023-08-16 (×2): qty 2

## 2023-08-16 MED ORDER — LORAZEPAM 2 MG/ML IJ SOLN: 0.5000 mg | INTRAMUSCULAR | Status: DC | PRN

## 2023-08-16 MED ORDER — ADULT MULTIVITAMIN W/MINERALS CH
1.0000 | ORAL_TABLET | Freq: Every day | ORAL | Status: DC
  Administered 2023-08-16 – 2023-08-20 (×5): 1 via ORAL
  Filled 2023-08-16 (×5): qty 1

## 2023-08-16 MED ORDER — POTASSIUM CHLORIDE CRYS ER 20 MEQ PO TBCR
40.0000 meq | EXTENDED_RELEASE_TABLET | Freq: Two times a day (BID) | ORAL | Status: AC
  Administered 2023-08-16 (×2): 40 meq via ORAL
  Filled 2023-08-16 (×2): qty 2

## 2023-08-16 NOTE — ED Notes (Signed)
ED TO INPATIENT HANDOFF REPORT  ED Nurse Name and Phone #: Jaycen Vercher [phone]  S Name/Age/Gender William Baxter 62 y.o. male Room/Bed: 045C/045C  Code Status   Code Status: Full Code  Home/SNF/Other Home Patient oriented to: self Is this baseline? No   Triage Complete: Triage complete  Chief Complaint Alcohol withdrawal [facility]) [F10.939]  Triage Note Patient presents via EMS from home for two reported seizures. Per EMS, patient drinks 1/5 of liquor plus several beers daily but stopped drinking completely on Friday. Per EMS, patient was laying on a bed and did not sustain any trauma. Upon arrival, patient is alert and oriented only to self. Per EMS, patient was incontinent of urine upon their arrival to patient. Patient received approximately LR en route from EMS.   Allergies Allergies  Allergen Reactions   Penicillins Nausea And Vomiting    Level of Care/Admitting Diagnosis ED Disposition     ED Disposition  Admit   Condition  --   Comment  Hospital Area: [facility] [[id number]]  Level of Care: Progressive [102]  Admit to Progressive based on following criteria: NEUROLOGICAL AND NEUROSURGICAL complex patients with significant risk of instability, who do not meet ICU criteria, yet require close observation or frequent assessment (< / = every 2 - 4 hours) with medical / nursing intervention.  May admit patient to [facility] or [facility] if equivalent level of care is available:: No  Covid Evaluation: Asymptomatic - no recent exposure (last 10 days) testing not required  Diagnosis: Alcohol withdrawal (HCC) [291.81.ICD-9-CM]  Admitting Physician: Therisa Doyne [[id number]]  Attending Physician: Therisa Doyne [[id number]]  Certification:: I certify this patient will need inpatient services for at least 2 midnights  Expected Medical Readiness: 08/18/2023          B Medical/Surgery History Past Medical History:  Diagnosis Date   BPH (benign  prostatic hyperplasia)    Heart disease    Medical history non-contributory    Migraine    Myocardial infarction acute [facility])    Past Surgical History:  Procedure Laterality Date   CARDIAC CATHETERIZATION       A IV Location/Drains/Wounds Patient Lines/Drains/Airways Status     Active Line/Drains/Airways     Name Placement date Placement time Site Days   Peripheral IV 08/15/23 18 G Anterior;Distal;Right;Upper Arm 08/15/23  1547  Arm  1            Intake/Output Last 24 hours  Intake/Output Summary (Last 24 hours) at 08/16/2023 1106 Last data filed at 08/16/2023 0618 Gross per 24 hour  Intake 1700 ml  Output 920 ml  Net 780 ml    Labs/Imaging Results for orders placed or performed during the hospital encounter of 08/15/23 (from the past 48 hour(s))  CBC with Differential     Status: Abnormal   Collection Time: 08/15/23  6:00 PM  Result Value Ref Range   WBC 4.6 4.0 - 10.5 K/uL   RBC 3.79 (L) 4.22 - 5.81 MIL/uL   Hemoglobin 12.2 (L) 13.0 - 17.0 g/dL   HCT 09.8 (L) 11.9 - 14.7 %   MCV 93.9 80.0 - 100.0 fL   MCH 32.2 26.0 - 34.0 pg   MCHC 34.3 30.0 - 36.0 g/dL   RDW 82.9 56.2 - 13.0 %   Platelets 38 (L) 150 - 400 K/uL    Comment: Immature Platelet Fraction may be clinically indicated, consider ordering this additional test [id number] REPEATED TO VERIFY PLATELET COUNT CONFIRMED BY SMEAR    nRBC  0.0 0.0 - 0.2 %   Neutrophils Relative % 82 %   Neutro Abs 3.7 1.7 - 7.7 K/uL   Lymphocytes Relative 10 %   Lymphs Abs 0.4 (L) 0.7 - 4.0 K/uL   Monocytes Relative 7 %   Monocytes Absolute 0.3 0.1 - 1.0 K/uL   Eosinophils Relative 0 %   Eosinophils Absolute 0.0 0.0 - 0.5 K/uL   Basophils Relative 0 %   Basophils Absolute 0.0 0.0 - 0.1 K/uL   Immature Granulocytes 1 %   Abs Immature Granulocytes 0.03 0.00 - 0.07 K/uL    Comment: Performed at [facility], [address], [place], [place] [address]  Comprehensive metabolic panel     Status: Abnormal    Collection Time: 08/15/23  6:00 PM  Result Value Ref Range   Sodium 132 (L) 135 - 145 mmol/L   Potassium 4.1 3.5 - 5.1 mmol/L   Chloride 97 (L) 98 - 111 mmol/L   CO2 23 22 - 32 mmol/L   Glucose, Bld 140 (H) 70 - 99 mg/dL    Comment: Glucose reference range applies only to samples taken after fasting for at least 8 hours.   BUN <5 (L) 8 - 23 mg/dL   Creatinine, Ser 2.84 0.61 - 1.24 mg/dL   Calcium 8.9 8.9 - 13.2 mg/dL   Total Protein 7.1 6.5 - 8.1 g/dL   Albumin 3.1 (L) 3.5 - 5.0 g/dL   AST 440 (H) 15 - 41 U/L   ALT 51 (H) 0 - 44 U/L   Alkaline Phosphatase 67 38 - 126 U/L   Total Bilirubin 1.0 0.3 - 1.2 mg/dL   GFR, Estimated >10 >27 mL/min    Comment: (NOTE) Calculated using the CKD-EPI Creatinine Equation (2021)    Anion gap 12 5 - 15    Comment: Performed at [facility], [address], [place], [place] [address]  Protime-INR     Status: None   Collection Time: 08/15/23  6:00 PM  Result Value Ref Range   Prothrombin Time 15.1 11.4 - 15.2 seconds   INR 1.2 0.8 - 1.2    Comment: (NOTE) INR goal varies based on device and disease states. Performed at [facility], [address] [place], [place] [address]   Rapid urine drug screen (hospital performed)     Status: Abnormal   Collection Time: 08/15/23  6:00 PM  Result Value Ref Range   Opiates NONE DETECTED NONE DETECTED   Cocaine NONE DETECTED NONE DETECTED   Benzodiazepines NONE DETECTED NONE DETECTED   Amphetamines NONE DETECTED NONE DETECTED   Tetrahydrocannabinol NONE DETECTED NONE DETECTED   Barbiturates POSITIVE (A) NONE DETECTED    Comment: (NOTE) DRUG SCREEN FOR MEDICAL PURPOSES ONLY.  IF CONFIRMATION IS NEEDED FOR ANY PURPOSE, NOTIFY LAB WITHIN 5 DAYS.  LOWEST DETECTABLE LIMITS FOR URINE DRUG SCREEN Drug Class                     Cutoff (ng/mL) Amphetamine and metabolites    1000 Barbiturate and metabolites    200 Benzodiazepine                 200 Opiates and metabolites        300 Cocaine  and metabolites        300 THC                            50  Performed at [facility], [address] [place], [place] [address]   Ammonia     Status: Abnormal   Collection Time: 08/15/23  7:18 PM  Result Value Ref Range   Ammonia 39 (H) 9 - 35 umol/L    Comment: Performed at [facility], [address], [place], [place] [address]  Urinalysis, Complete w Microscopic -Urine, Clean Catch     Status: Abnormal   Collection Time: 08/15/23  9:05 PM  Result Value Ref Range   Color, Urine STRAW (A) YELLOW   APPearance CLEAR CLEAR   Specific Gravity, Urine 1.006 1.005 - 1.030   pH 8.0 5.0 - 8.0   Glucose, UA NEGATIVE NEGATIVE mg/dL   Hgb urine dipstick NEGATIVE NEGATIVE   Bilirubin Urine NEGATIVE NEGATIVE   Ketones, ur NEGATIVE NEGATIVE mg/dL   Protein, ur NEGATIVE NEGATIVE mg/dL   Nitrite NEGATIVE NEGATIVE   Leukocytes,Ua NEGATIVE NEGATIVE   RBC / HPF 0-5 0 - 5 RBC/hpf   WBC, UA 0-5 0 - 5 WBC/hpf   Bacteria, UA RARE (A) NONE SEEN   Squamous Epithelial / HPF 0-5 0 - 5 /HPF   Mucus PRESENT     Comment: Performed at [facility], [address], [place], [place] [address]  Osmolality, urine     Status: None   Collection Time: 08/15/23  9:05 PM  Result Value Ref Range   Osmolality, Ur 351 300 - 900 mOsm/kg    Comment: Performed at [facility], [address] [place], [place] [address]  Creatinine, urine, random     Status: None   Collection Time: 08/15/23  9:05 PM  Result Value Ref Range   Creatinine, Urine 20 mg/dL    Comment: Performed at [facility], [address], [place], [place] [address]  Sodium, urine, random     Status: None   Collection Time: 08/15/23  9:05 PM  Result Value Ref Range   Sodium, Ur 147 mmol/L    Comment: Performed at [facility], [address] [place], [place] [address]  Magnesium     Status: None   Collection Time: 08/15/23 10:15 PM  Result Value Ref Range   Magnesium 1.9 1.7 - 2.4 mg/dL    Comment: Performed  at [facility], [address] [place], [place] [address]  Phosphorus     Status: None   Collection Time: 08/15/23 10:15 PM  Result Value Ref Range   Phosphorus 2.9 2.5 - 4.6 mg/dL    Comment: Performed at [facility], [address], [place], [place] [address]  Iron and TIBC     Status: Abnormal   Collection Time: 08/15/23 10:15 PM  Result Value Ref Range   Iron 148 45 - 182 ug/dL   TIBC 284 132 - 440 ug/dL   Saturation Ratios 51 (H) 17.9 - 39.5 %   UIBC 145 ug/dL    Comment: Performed at [facility], [address] [place], [place] [address]  Ferritin     Status: Abnormal   Collection Time: 08/15/23 10:15 PM  Result Value Ref Range   Ferritin 1,400 (H) 24 - 336 ng/mL    Comment: Performed at [facility], [address], [place], [place] [address]  Reticulocytes     Status: Abnormal   Collection Time: 08/15/23 10:15 PM  Result Value Ref Range   Retic Ct Pct 0.9 0.4 - 3.1 %   RBC.  3.88 (L) 4.22 - 5.81 MIL/uL   Retic Count, Absolute 34.9 19.0 - 186.0 K/uL   Immature Retic Fract 11.2 2.3 - 15.9 %    Comment: Performed at [facility], [address] [place], [place] [address]  Ethanol     Status: None   Collection Time: 08/15/23 10:15 PM  Result Value Ref Range   Alcohol, Ethyl (B) <10 <10 mg/dL    Comment: (NOTE) Lowest detectable limit for serum alcohol is 10 mg/dL.  For medical purposes only. Performed at [facility], [address], [place], [place] [address]   CK     Status: Abnormal   Collection Time: 08/15/23 10:15 PM  Result Value Ref Range   Total CK 726 (H) 49 - 397 U/L    Comment: Performed at [facility], [address] [place], [place] [address]  Osmolality     Status: Abnormal   Collection Time: 08/15/23 10:15 PM  Result Value Ref Range   Osmolality 273 (L) 275 - 295 mOsm/kg    Comment: Performed at [facility], [address] [place], [place] [address]  TSH     Status: None   Collection Time:  08/15/23 10:15 PM  Result Value Ref Range   TSH 0.530 0.350 - 4.500 uIU/mL    Comment: Performed by a 3rd Generation assay with a functional sensitivity of <=0.01 uIU/mL. Performed at [facility], [address], [place], [place] [address]   Basic metabolic panel     Status: Abnormal   Collection Time: 08/15/23 10:15 PM  Result Value Ref Range   Sodium 129 (L) 135 - 145 mmol/L   Potassium 3.4 (L) 3.5 - 5.1 mmol/L   Chloride 93 (L) 98 - 111 mmol/L   CO2 23 22 - 32 mmol/L   Glucose, Bld 100 (H) 70 - 99 mg/dL    Comment: Glucose reference range applies only to samples taken after fasting for at least 8 hours.   BUN <5 (L) 8 - 23 mg/dL   Creatinine, Ser 5.28 0.61 - 1.24 mg/dL   Calcium 8.9 8.9 - 41.3 mg/dL   GFR, Estimated >24 >40 mL/min    Comment: (NOTE) Calculated using the CKD-EPI Creatinine Equation (2021)    Anion gap 13 5 - 15    Comment: Performed at [facility], [address] [place], [place] [address]  HIV Antibody (routine testing w rflx)     Status: None   Collection Time: 08/15/23 10:15 PM  Result Value Ref Range   HIV Screen 4th Generation wRfx Non Reactive Non Reactive    Comment: Performed at [facility], [address], [place], [place] [address]   CT HEAD WO CONTRAST ( )  Result Date: 08/15/2023 CLINICAL DATA:  Seizure EXAM: CT HEAD WITHOUT CONTRAST TECHNIQUE: Contiguous axial images were obtained from the base of the skull through the vertex without intravenous contrast. RADIATION DOSE REDUCTION: This exam was performed according to the departmental dose-optimization program which includes automated exposure control, adjustment of the mA and/or kV according to patient size and/or use of iterative reconstruction technique. COMPARISON:  12/30/2016 FINDINGS: Brain: There is no mass, hemorrhage or extra-axial collection. The appearance of the white matter is normal for the patient's age. There is generalized atrophy. Vascular: Atherosclerotic  calcification of the internal carotid arteries at the skull base. No abnormal hyperdensity of the major intracranial arteries or dural venous sinuses. Skull: The visualized skull base, calvarium and  extracranial soft tissues are normal. Sinuses/Orbits: Small amount of right mastoid fluid. Minimal left sphenoid sinus mucosal thickening. Normal orbits. IMPRESSION: 1. No acute intracranial abnormality. 2. Generalized atrophy. 3. Small amount of right mastoid fluid. Electronically Signed   By: Deatra Robinson M.D.   On: 08/15/2023 21:22   US Abdomen Limited RUQ (LIVER/GB)  Result Date: 08/15/2023 CLINICAL DATA:  Elevated liver function tests. EXAM: ULTRASOUND ABDOMEN LIMITED RIGHT UPPER QUADRANT COMPARISON:  None Available. FINDINGS: Gallbladder: No gallstones or wall thickening visualized (1.2 mm). No sonographic Murphy sign noted by sonographer. Common bile duct: Diameter: 4.3 mm Liver: No focal lesion identified. Diffusely increased echogenicity of the liver parenchyma is noted. Portal vein is patent on color Doppler imaging with normal direction of blood flow towards the liver. Other: None. IMPRESSION: Hepatic steatosis without focal liver lesions. Electronically Signed   By: Aram Candela M.D.   On: 08/15/2023 20:40   DG CHEST PORT 1 VIEW  Result Date: 08/15/2023 CLINICAL DATA:  Dyspnea EXAM: PORTABLE CHEST 1 VIEW COMPARISON:  02/06/2020 FINDINGS: Normal cardiac size. No focal opacity or pleural effusion. No pneumothorax. IMPRESSION: No active disease. Electronically Signed   By: Jasmine Pang M.D.   On: 08/15/2023 20:28    Pending Labs Unresulted Labs (From admission, onward)     Start     Ordered   08/17/23 0500  CK  Tomorrow morning,   R        08/16/23 0652   08/16/23 1500  Basic metabolic panel  Once,   R        08/16/23 0654   08/16/23 0653  Rapid urine drug screen (hospital performed)  ONCE - STAT,   STAT        08/16/23 0652   08/16/23 0500  Hepatitis panel, acute  Tomorrow morning,    R        08/15/23 1945   08/16/23 0500  Vitamin B12  (Anemia Panel (PNL))  Tomorrow morning,   R        08/15/23 1946   08/16/23 0500  Folate  (Anemia Panel (PNL))  Tomorrow morning,   R        08/15/23 1946   08/16/23 0500  Prealbumin  Tomorrow morning,   R        08/15/23 1946   08/16/23 0500  Magnesium  Tomorrow morning,   R        08/15/23 2043   08/16/23 0500  Phosphorus  Tomorrow morning,   R        08/15/23 2043   08/16/23 0500  Comprehensive metabolic panel  Tomorrow morning,   R       Question:  Release to patient  Answer:  Immediate   08/15/23 2043   08/16/23 0500  CBC  Tomorrow morning,   R       Question:  Release to patient  Answer:  Immediate   08/15/23 2043   08/15/23 1947  Vitamin B1  Add-on,   AD        08/15/23 1946            Vitals/Pain Today's Vitals   08/16/23 0845 08/16/23 0859 08/16/23 1030 08/16/23 1033  BP: 120/84  109/72 109/72  Pulse:    (!) 109  Resp: 14  14   Temp:  98.9 F (37.2 C)    TempSrc:  Oral    SpO2:      Weight:      Height:      PainSc:  Isolation Precautions No active isolations  Medications Medications  atorvastatin (LIPITOR) tablet 80 mg (has no administration in time range)  tamsulosin (FLOMAX) capsule 0.4 mg (0.4 mg Oral Given 08/16/23 0936)  acetaminophen (TYLENOL) tablet 650 mg (has no administration in time range)    Or  acetaminophen (TYLENOL) suppository 650 mg (has no administration in time range)  HYDROcodone-acetaminophen (NORCO/VICODIN) 5-325 MG per tablet 1-2 tablet (has no administration in time range)  ondansetron (ZOFRAN) tablet 4 mg (has no administration in time range)    Or  ondansetron (ZOFRAN) injection 4 mg (has no administration in time range)  0.9 %  sodium chloride infusion (0 mLs Intravenous Stopped 08/16/23 0736)  PHENObarbital (LUMINAL) injection 130 mg (130 mg Intravenous Given 08/15/23 2104)    Followed by  PHENObarbital (LUMINAL) injection 130 mg (130 mg Intravenous Given  08/16/23 0123)  PHENobarbital (LUMINAL) tablet 64.8 mg (has no administration in time range)    Followed by  PHENobarbital (LUMINAL) tablet 32.4 mg (has no administration in time range)  potassium chloride 10 mEq in 100 mL IVPB (0 mEq Intravenous Stopped 08/16/23 0736)  LORazepam (ATIVAN) tablet 1-4 mg ( Oral See Alternative 08/16/23 1029)    Or  LORazepam (ATIVAN) injection 1-4 mg (2 mg Intravenous Given 08/16/23 1029)  thiamine (VITAMIN B1) tablet 100 mg (100 mg Oral Given 08/16/23 0935)    Or  thiamine (VITAMIN B1) injection 100 mg ( Intravenous See Alternative 08/16/23 0935)  folic acid (FOLVITE) tablet 1 mg (1 mg Oral Given 08/16/23 0936)  multivitamin with minerals tablet 1 tablet (1 tablet Oral Given 08/16/23 0935)  potassium chloride SA (KLOR-CON M) CR tablet 40 mEq (40 mEq Oral Given 08/16/23 0934)  feeding supplement (ENSURE ENLIVE / ENSURE PLUS) liquid 237 mL (has no administration in time range)  PHENObarbital (LUMINAL) injection 130 mg (130 mg Intravenous Given 08/15/23 1550)  lactated ringers bolus 1,000 mL (0 mLs Intravenous Stopped 08/15/23 1658)  PHENObarbital (LUMINAL) injection 130 mg (130 mg Intravenous Given 08/15/23 1655)    Mobility walks with person assist     Focused Assessments    R Recommendations: See Admitting Provider Note  Report given to:   Additional Notes:

## 2023-08-16 NOTE — Assessment & Plan Note (Signed)
In the setting of alcohol abuse Continue to obtain serial labs and follow

## 2023-08-16 NOTE — Progress Notes (Signed)
OT Cancellation Note  Patient Details Name: William Baxter MRN: [id number] DOB: 10/12/1961   Cancelled Treatment:    Reason Eval/Treat Not Completed: Fatigue/lethargy limiting ability to participate.  On going restlessness with calming meds given.  Patient not able to participate with eval at this time, order for transfer to the floor.  OT will try later as appropriate.    [name] [name] 08/16/2023, 11:25 AM 08/16/2023  RP, OTR/L  Acute Rehabilitation Services  Office:  [phone]

## 2023-08-16 NOTE — Evaluation (Signed)
Occupational Therapy Evaluation Patient Details Name: William Baxter MRN: [id number] DOB: [birth year] Today's Date: 08/16/2023   History of Present Illness 62 y.o. male with medical history significant of CAD, heavy alcohol abuse, BPH, history migraine.  Presented 10/13 with wittness alcohol withdrawal seizures.   Clinical Impression   Patient admitted for the diagnosis above.  PTA he lives at home with his SO, and is independent with his ADL, iADL and did not need an AD for mobility.  Patient no longer drives.  Currently he is essentially obtunded, and attempting to sit EOB did nothing to increase his LOA.  Patient returned to supine in bed, and SO provided supplies so she could wash his face and upper torso.  OT to continue efforts in the acute setting, and would expect a return to baseline once he is outside of the withdrawals window.  No post acute OT is anticipated.         If plan is discharge home, recommend the following: Assist for transportation    Functional Status Assessment  Patient has had a recent decline in their functional status and demonstrates the ability to make significant improvements in function in a reasonable and predictable amount of time.  Equipment Recommendations  None recommended by OT    Recommendations for Other Services       Precautions / Restrictions Precautions Precautions: Fall Precaution Comments: agitation Restrictions Weight Bearing Restrictions: No      Mobility Bed Mobility Overal bed mobility: Needs Assistance Bed Mobility: Rolling, Sidelying to Sit, Sit to Sidelying Rolling: Max assist Sidelying to sit: Max assist     Sit to sidelying: Max assist General bed mobility comments: Would expect quick return to baseline    Transfers                   General transfer comment: NT      Balance Overall balance assessment: Needs assistance Sitting-balance support: Feet supported Sitting balance-Leahy Scale: Poor          Standing balance comment: NT                           ADL either performed or assessed with clinical judgement   ADL                                         General ADL Comments: Unable to follow commands due to lethargy.     Vision   Vision Assessment?: No apparent visual deficits     Perception Perception: Not tested       Praxis Praxis: Not tested       Pertinent Vitals/Pain Pain Assessment Pain Assessment: Faces Faces Pain Scale: No hurt     Extremity/Trunk Assessment Upper Extremity Assessment Upper Extremity Assessment: Overall WFL for tasks assessed (non purposeful AROM to B UE's)   Lower Extremity Assessment Lower Extremity Assessment: Defer to PT evaluation   Cervical / Trunk Assessment Cervical / Trunk Assessment: Normal   Communication Communication Communication: Difficulty following commands/understanding;Difficulty communicating thoughts/reduced clarity of speech   Cognition Arousal: Lethargic, Suspect due to medications Behavior During Therapy: Restless Overall Cognitive Status: Difficult to assess  Home Living Family/patient expects to be discharged to:: Private residence Living Arrangements: Spouse/significant other Available Help at Discharge: Family;Available 24 hours/day Type of Home: Apartment Home Access: Stairs to enter [employer] of Steps: full flight to 2nd floor apartment.   Home Layout: One level     Bathroom Shower/Tub: Chief Strategy Officer: Standard Bathroom Accessibility: Yes How Accessible: Accessible via walker Home Equipment: None          Prior Functioning/Environment Prior Level of Function : Independent/Modified Independent             Mobility Comments: Walks without an AD ADLs Comments: Ind with ADL and iADL, no longer drives.        OT Problem List: Impaired  balance (sitting and/or standing);Decreased cognition      OT Treatment/Interventions: Self-care/ADL training;Therapeutic activities;DME and/or AE instruction;Balance training;Patient/family education    OT Goals(Current goals can be found in the care plan section) Acute Rehab OT Goals Patient Stated Goal: Return home OT Goal Formulation: With patient Time For Goal Achievement: 08/30/23 Potential to Achieve Goals: Good ADL Goals Pt Will Perform Grooming: with supervision;standing Pt Will Perform Lower Body Dressing: with supervision;sit to/from stand Pt Will Transfer to Toilet: with supervision;ambulating;regular height toilet  OT Frequency: Min 1X/week    Co-evaluation              AM-PAC OT "6 Clicks" Daily Activity     Outcome Measure Help from another person eating meals?: Total Help from another person taking care of personal grooming?: Total Help from another person toileting, which includes using toliet, bedpan, or urinal?: Total Help from another person bathing (including washing, rinsing, drying)?: Total Help from another person to put on and taking off regular upper body clothing?: Total Help from another person to put on and taking off regular lower body clothing?: Total 6 Click Score: 6   End of Session Nurse Communication: Other (comment) (SO wishing patient to be cleaned)  Activity Tolerance: Patient limited by lethargy Patient left: in bed;with call bell/phone within reach;with family/visitor present  OT Visit Diagnosis: Other symptoms and signs involving cognitive function                Time: 1350-1409 OT Time Calculation (min): 19 min Charges:  OT General Charges $OT Visit: 1 Visit OT Evaluation $OT Eval Moderate Complexity: 1 Mod  08/16/2023  RP, OTR/L  Acute Rehabilitation Services  Office:  [phone]   Suzanna Obey 08/16/2023, 2:17 PM

## 2023-08-16 NOTE — ED Notes (Signed)
Admitting team paged about pt's increasing confusion/restlessness/agitation and no current PRN for same until later in morning.

## 2023-08-16 NOTE — Plan of Care (Signed)
  Problem: Education: Goal: Knowledge of General Education information will improve Description: Including pain rating scale, medication(s)/side effects and non-pharmacologic comfort measures Outcome: Progressing   Problem: Health Behavior/Discharge Planning: Goal: Ability to manage health-related needs will improve Outcome: Progressing   Problem: Clinical Measurements: Goal: Ability to maintain clinical measurements within normal limits will improve Outcome: Progressing Goal: Will remain free from infection Outcome: Progressing Goal: Diagnostic test results will improve Outcome: Progressing Goal: Respiratory complications will improve Outcome: Progressing Goal: Cardiovascular complication will be avoided Outcome: Progressing   Problem: Activity: Goal: Risk for activity intolerance will decrease Outcome: Progressing   Problem: Nutrition: Goal: Adequate nutrition will be maintained Outcome: Progressing   Problem: Coping: Goal: Level of anxiety will decrease Outcome: Progressing   Problem: Elimination: Goal: Will not experience complications related to bowel motility Outcome: Progressing Goal: Will not experience complications related to urinary retention Outcome: Progressing   Problem: Pain Managment: Goal: General experience of comfort will improve Outcome: Progressing   Problem: Safety: Goal: Ability to remain free from injury will improve Outcome: Progressing   Problem: Skin Integrity: Goal: Risk for impaired skin integrity will decrease Outcome: Progressing   Problem: Education: Goal: Knowledge of General Education information will improve Description: Including pain rating scale, medication(s)/side effects and non-pharmacologic comfort measures Outcome: Progressing   Problem: Health Behavior/Discharge Planning: Goal: Ability to manage health-related needs will improve Outcome: Progressing   Problem: Clinical Measurements: Goal: Ability to maintain  clinical measurements within normal limits will improve Outcome: Progressing   Problem: Clinical Measurements: Goal: Will remain free from infection Outcome: Progressing   Problem: Clinical Measurements: Goal: Diagnostic test results will improve Outcome: Progressing   Problem: Activity: Goal: Risk for activity intolerance will decrease Outcome: Progressing   Problem: Clinical Measurements: Goal: Cardiovascular complication will be avoided Outcome: Progressing   Problem: Clinical Measurements: Goal: Cardiovascular complication will be avoided Outcome: Progressing   Problem: Activity: Goal: Risk for activity intolerance will decrease Outcome: Progressing   Problem: Nutrition: Goal: Adequate nutrition will be maintained Outcome: Progressing   Problem: Nutrition: Goal: Adequate nutrition will be maintained Outcome: Progressing   Problem: Nutrition: Goal: Adequate nutrition will be maintained Outcome: Progressing   Problem: Coping: Goal: Level of anxiety will decrease Outcome: Progressing   Problem: Elimination: Goal: Will not experience complications related to urinary retention Outcome: Progressing   Problem: Elimination: Goal: Will not experience complications related to urinary retention Outcome: Progressing   Problem: Pain Managment: Goal: General experience of comfort will improve Outcome: Progressing   Problem: Pain Managment: Goal: General experience of comfort will improve Outcome: Progressing   Problem: Safety: Goal: Ability to remain free from injury will improve Outcome: Progressing   Problem: Skin Integrity: Goal: Risk for impaired skin integrity will decrease Outcome: Progressing   Problem: Skin Integrity: Goal: Risk for impaired skin integrity will decrease Outcome: Progressing

## 2023-08-16 NOTE — ED Notes (Addendum)
Return page received from Washburn MD; provider updating orders for CIWA/agitation.

## 2023-08-16 NOTE — Progress Notes (Signed)
PT Cancellation Note  Patient Details Name: William Baxter MRN: [id number] DOB: Sep 30, 1961   Cancelled Treatment:    Reason Eval/Treat Not Completed: Other (comment)  Continues to be restless and unable to participate;   Will follow up later today as time allows;  Otherwise, will follow up for PT tomorrow;   Thank you,  Van Clines, PT  Acute Rehabilitation Services Office [phone]    Levi Aland 08/16/2023, 1:33 PM

## 2023-08-16 NOTE — Progress Notes (Signed)
[facility] PROGRESS NOTE    Progress Note  William Baxter  ZOX:[id number] DOB: 07-26-1961 DOA: 08/15/2023 PCP: [facility]     Brief Narrative:   William Baxter is an 62 y.o. male past medical history significant for CAD, alcohol abuse, BPH and migraine presents with alcoholic seizure withdrawals.  Drinking about 2 days prior to admission family found him having tonic-clonic with postictal state received 2 doses of phenobarbital in the ED  Assessment/Plan:   Delirium tremens/alcohol withdrawal (HCC)/ Alcohol withdrawal seizure : Responded well to phenobarbital in the ED. Will continue phenobarbital, heart rate and blood pressures down, he was go anywhere from 2-10. CT of the head showed no acute intracranial findings general atrophy. INR 1.2, albumin 3.1.  Chronic Thrombocytopenia: Appears to be chronic but lower than usual usually ranges around 60-90, on admission it was 40. Fortunately PT and INR stable continue with check platelets intermittently.  CAD S/P percutaneous coronary angioplasty Hold statins he is not on aspirin and Plavix at home.  Elevated LFTs Likely due to alcohol abuse hold statins, AST and ALT are split 2-1. Abdominal ultrasound showed hepatic steatosis.  Hyponatremia: He appears euvolemic on physical exam.  Taking low-dose urine it does appear slightly concentrated. Specific gravity in urine is 1006 urine appears clear, TSH 0.5 sodium is low chloride is low BUN is less than 5. Urine osmolarity 351 urine sodium 147. He was started on IV fluids sodium is slowly trending down. Will go ahead and continue IV fluids for now recheck a basic metabolic panel around 3:00 pm.  Mild rhabdomyolysis: Continue IV fluids recheck tomorrow morning.  Hypokalemia: Replete orally recheck in the morning.    DVT prophylaxis: lovenox Family Communication:none Status is: Inpatient Remains inpatient appropriate because: Alcohol withdrawal  seizures.    Code Status:     Code Status Orders  (From admission, onward)           Start     Ordered   08/15/23 1951  Full code  Continuous       Question:  By:  Answer:  Consent: discussion documented in EHR   08/15/23 1950           Code Status History     Date Active Date Inactive Code Status Order ID Comments User Context   06/14/2016 0536 06/15/2016 1201 Full Code [id number]  Hillary Bow, DO ED   12/18/2013 1504 12/19/2013 1436 Full Code [id number]  Orpah Cobb, MD Inpatient   12/16/2013 0156 12/18/2013 1504 Full Code [id number]  Orpah Cobb, MD Inpatient         IV Access:   Peripheral IV   Procedures and diagnostic studies:   CT HEAD WO CONTRAST ( )  Result Date: 08/15/2023 CLINICAL DATA:  Seizure EXAM: CT HEAD WITHOUT CONTRAST TECHNIQUE: Contiguous axial images were obtained from the base of the skull through the vertex without intravenous contrast. RADIATION DOSE REDUCTION: This exam was performed according to the departmental dose-optimization program which includes automated exposure control, adjustment of the mA and/or kV according to patient size and/or use of iterative reconstruction technique. COMPARISON:  12/30/2016 FINDINGS: Brain: There is no mass, hemorrhage or extra-axial collection. The appearance of the white matter is normal for the patient's age. There is generalized atrophy. Vascular: Atherosclerotic calcification of the internal carotid arteries at the skull base. No abnormal hyperdensity of the major intracranial arteries or dural venous sinuses. Skull: The visualized skull base, calvarium and extracranial soft tissues are normal. Sinuses/Orbits: Small amount of  right mastoid fluid. Minimal left sphenoid sinus mucosal thickening. Normal orbits. IMPRESSION: 1. No acute intracranial abnormality. 2. Generalized atrophy. 3. Small amount of right mastoid fluid. Electronically Signed   By: Deatra Robinson M.D.   On: 08/15/2023 21:22   US Abdomen  Limited RUQ (LIVER/GB)  Result Date: 08/15/2023 CLINICAL DATA:  Elevated liver function tests. EXAM: ULTRASOUND ABDOMEN LIMITED RIGHT UPPER QUADRANT COMPARISON:  None Available. FINDINGS: Gallbladder: No gallstones or wall thickening visualized (1.2 mm). No sonographic Murphy sign noted by sonographer. Common bile duct: Diameter: 4.3 mm Liver: No focal lesion identified. Diffusely increased echogenicity of the liver parenchyma is noted. Portal vein is patent on color Doppler imaging with normal direction of blood flow towards the liver. Other: None. IMPRESSION: Hepatic steatosis without focal liver lesions. Electronically Signed   By: Aram Candela M.D.   On: 08/15/2023 20:40   DG CHEST PORT 1 VIEW  Result Date: 08/15/2023 CLINICAL DATA:  Dyspnea EXAM: PORTABLE CHEST 1 VIEW COMPARISON:  02/06/2020 FINDINGS: Normal cardiac size. No focal opacity or pleural effusion. No pneumothorax. IMPRESSION: No active disease. Electronically Signed   By: Jasmine Pang M.D.   On: 08/15/2023 20:28     Medical Consultants:   None.   Subjective:    William Baxter sedated this morning he knows he is in the hospital.  Objective:    Vitals:   08/16/23 0200 08/16/23 0230 08/16/23 0300 08/16/23 0500  BP: (!) 125/96 (!) 132/91 122/82 113/64  Pulse:    90  Resp: (!) 24 18 15 14   Temp:    99.1 F (37.3 C)  TempSrc:      SpO2: 98%   98%  Weight:      Height:       SpO2: 98 % O2 Flow Rate (L/min): 2 L/min   Intake/Output Summary (Last 24 hours) at 08/16/2023 0631 Last data filed at 08/16/2023 0618 Gross per 24 hour  Intake 1700 ml  Output 920 ml  Net 780 ml   Filed Weights   08/15/23 1541  Weight: 70.3 kg    Exam: General exam: In no acute distress. Respiratory system: Good air movement and clear to auscultation. Cardiovascular system: S1 & S2 heard, RRR. No JVD. Gastrointestinal system: Abdomen is nondistended, soft and nontender.  Extremities: No pedal edema. Skin: No rashes,  lesions or ulcers Psychiatry: No judgment or insight of medical condition.   Data Reviewed:    Labs: Basic Metabolic Panel: Recent Labs  Lab 08/15/23 1800 08/15/23 2215  NA 132* 129*  K 4.1 3.4*  CL 97* 93*  CO2 23 23  GLUCOSE 140* 100*  BUN <5* <5*  CREATININE 0.80 0.78  CALCIUM 8.9 8.9  MG  --  1.9  PHOS  --  2.9   GFR Estimated Creatinine Clearance: 94 mL/min (by C-G formula based on SCr of 0.78 mg/dL). Liver Function Tests: Recent Labs  Lab 08/15/23 1800  AST 127*  ALT 51*  ALKPHOS 67  BILITOT 1.0  PROT 7.1  ALBUMIN 3.1*   No results for input(s): "LIPASE", "AMYLASE" in the last 168 hours. Recent Labs  Lab 08/15/23 1918  AMMONIA 39*   Coagulation profile Recent Labs  Lab 08/15/23 1800  INR 1.2   COVID-19 Labs  Recent Labs    08/15/23 2215  FERRITIN 1,400*    Lab Results  Component Value Date   SARSCOV2NAA NEGATIVE 04/01/2022   SARSCOV2NAA NEGATIVE 07/06/2021    CBC: Recent Labs  Lab 08/15/23 1800  WBC 4.6  NEUTROABS 3.7  HGB 12.2*  HCT 35.6*  MCV 93.9  PLT 38*   Cardiac Enzymes: Recent Labs  Lab 08/15/23 2214-08-22  CKTOTAL 726*   BNP (last 3 results) No results for input(s): "PROBNP" in the last 8760 hours. CBG: No results for input(s): "GLUCAP" in the last 168 hours. D-Dimer: No results for input(s): "DDIMER" in the last 72 hours. Hgb A1c: No results for input(s): "HGBA1C" in the last 72 hours. Lipid Profile: No results for input(s): "CHOL", "HDL", "LDLCALC", "TRIG", "CHOLHDL", "LDLDIRECT" in the last 72 hours. Thyroid function studies: Recent Labs    08/15/23 2214-08-22  TSH 0.530   Anemia work up: Recent Labs    08/15/23 22-Aug-2214  FERRITIN 1,400*  TIBC 293  IRON 148  RETICCTPCT 0.9   Sepsis Labs: Recent Labs  Lab 08/15/23 1800  WBC 4.6   Microbiology No results found for this or any previous visit (from the past 240 hour(s)).   Medications:    atorvastatin  80 mg Oral q1800   folic acid  1 mg Oral Daily    multivitamin with minerals  1 tablet Oral Daily   phenobarbital  64.8 mg Oral Q12H   Followed by   Melene Muller ON 08/18/2023] phenobarbital  32.4 mg Oral Q12H   tamsulosin  0.4 mg Oral Daily   thiamine  100 mg Oral Daily   Or   thiamine  100 mg Intravenous Daily   Continuous Infusions:  sodium chloride 125 mL/hr at 08/16/23 0621      LOS: 1 day   Marinda Elk  [facility]  08/16/2023, 6:31 AM

## 2023-08-16 NOTE — ED Notes (Signed)
Pt continues to be restless in bed.  While this RN taking care of another room, pt pulled monitoring equipment off and removed IV.

## 2023-08-16 NOTE — ED Notes (Signed)
Pt trying to get out of bed and go to restroom.  Visitor at bedside trying to keep pt in bed.  This RN stepped in room to remind pt of fall risk and need to stay in bed for his safety.  Pt frustrated but cooperative.

## 2023-08-17 ENCOUNTER — Inpatient Hospital Stay (HOSPITAL_COMMUNITY): Payer: No Typology Code available for payment source

## 2023-08-17 DIAGNOSIS — F10931 Alcohol use, unspecified with withdrawal delirium: Secondary | ICD-10-CM | POA: Diagnosis not present

## 2023-08-17 DIAGNOSIS — R569 Unspecified convulsions: Secondary | ICD-10-CM | POA: Diagnosis not present

## 2023-08-17 LAB — BASIC METABOLIC PANEL
Anion gap: 11 (ref 5–15)
BUN: 5 mg/dL — ABNORMAL LOW (ref 8–23)
CO2: 20 mmol/L — ABNORMAL LOW (ref 22–32)
Calcium: 9.5 mg/dL (ref 8.9–10.3)
Chloride: 97 mmol/L — ABNORMAL LOW (ref 98–111)
Creatinine, Ser: 0.73 mg/dL (ref 0.61–1.24)
GFR, Estimated: >60 mL/min (ref >60)
Glucose, Bld: 99 mg/dL (ref 70–99)
Potassium: 3.7 mmol/L (ref 3.5–5.1)
Sodium: 128 mmol/L — ABNORMAL LOW (ref 135–145)

## 2023-08-17 LAB — COMPREHENSIVE METABOLIC PANEL
ALT: 42 U/L (ref 0–44)
AST: 108 U/L — ABNORMAL HIGH (ref 15–41)
Albumin: 3.7 g/dL (ref 3.5–5.0)
Alkaline Phosphatase: 64 U/L (ref 38–126)
Anion gap: 12 (ref 5–15)
BUN: <5 mg/dL — ABNORMAL LOW (ref 8–23)
CO2: 20 mmol/L — ABNORMAL LOW (ref 22–32)
Calcium: 9.9 mg/dL (ref 8.9–10.3)
Chloride: 98 mmol/L (ref 98–111)
Creatinine, Ser: 0.79 mg/dL (ref 0.61–1.24)
GFR, Estimated: >60 mL/min (ref >60)
Glucose, Bld: 90 mg/dL (ref 70–99)
Potassium: 4.1 mmol/L (ref 3.5–5.1)
Sodium: 130 mmol/L — ABNORMAL LOW (ref 135–145)
Total Bilirubin: 1.1 mg/dL (ref 0.3–1.2)
Total Protein: 8.1 g/dL (ref 6.5–8.1)

## 2023-08-17 LAB — CBC WITH DIFFERENTIAL/PLATELET
Abs Immature Granulocytes: 0.02 10*3/uL (ref 0.00–0.07)
Basophils Absolute: 0 10*3/uL (ref 0.0–0.1)
Basophils Relative: 1 %
Eosinophils Absolute: 0.1 10*3/uL (ref 0.0–0.5)
Eosinophils Relative: 1 %
HCT: 39.3 % (ref 39.0–52.0)
Hemoglobin: 13.8 g/dL (ref 13.0–17.0)
Immature Granulocytes: 1 %
Lymphocytes Relative: 21 %
Lymphs Abs: 0.9 10*3/uL (ref 0.7–4.0)
MCH: 32.3 pg (ref 26.0–34.0)
MCHC: 35.1 g/dL (ref 30.0–36.0)
MCV: 92 fL (ref 80.0–100.0)
Monocytes Absolute: 0.5 10*3/uL (ref 0.1–1.0)
Monocytes Relative: 11 %
Neutro Abs: 2.8 10*3/uL (ref 1.7–7.7)
Neutrophils Relative %: 65 %
Platelets: 39 10*3/uL — ABNORMAL LOW (ref 150–400)
RBC: 4.27 MIL/uL (ref 4.22–5.81)
RDW: 14.5 % (ref 11.5–15.5)
WBC: 4.3 10*3/uL (ref 4.0–10.5)
nRBC: 0 % (ref 0.0–0.2)

## 2023-08-17 LAB — MAGNESIUM
Magnesium: 1.7 mg/dL (ref 1.7–2.4)
Magnesium: 1.9 mg/dL (ref 1.7–2.4)

## 2023-08-17 LAB — CK: Total CK: 2052 U/L — ABNORMAL HIGH (ref 49–397)

## 2023-08-17 LAB — C-REACTIVE PROTEIN: CRP: 2.4 mg/dL — ABNORMAL HIGH (ref <1.0)

## 2023-08-17 LAB — PROCALCITONIN: Procalcitonin: 0.14 ng/mL

## 2023-08-17 MED ORDER — CLONIDINE HCL 0.1 MG PO TABS: 0.1000 mg | ORAL_TABLET | Freq: Four times a day (QID) | ORAL | Status: DC | PRN

## 2023-08-17 MED ORDER — POTASSIUM CHLORIDE 2 MEQ/ML IV SOLN
INTRAVENOUS | Status: DC
Start: 2023-08-17 — End: 2023-08-17

## 2023-08-17 MED ORDER — METOPROLOL TARTRATE 50 MG PO TABS
50.0000 mg | ORAL_TABLET | Freq: Two times a day (BID) | ORAL | Status: DC
  Administered 2023-08-17 – 2023-08-20 (×7): 50 mg via ORAL
  Filled 2023-08-17 (×7): qty 1

## 2023-08-17 MED ORDER — MAGNESIUM OXIDE -MG SUPPLEMENT 400 (240 MG) MG PO TABS
800.0000 mg | ORAL_TABLET | Freq: Two times a day (BID) | ORAL | Status: AC
  Administered 2023-08-17 (×2): 800 mg via ORAL
  Filled 2023-08-17 (×2): qty 2

## 2023-08-17 MED ORDER — POTASSIUM CHLORIDE IN NACL 20-0.9 MEQ/L-% IV SOLN
INTRAVENOUS | Status: AC
  Filled 2023-08-17 (×3): qty 1000

## 2023-08-17 MED ORDER — PANTOPRAZOLE SODIUM 40 MG PO TBEC
40.0000 mg | DELAYED_RELEASE_TABLET | Freq: Every day | ORAL | Status: DC
  Administered 2023-08-17 – 2023-08-20 (×4): 40 mg via ORAL
  Filled 2023-08-17 (×4): qty 1

## 2023-08-17 MED ORDER — SODIUM CHLORIDE 0.9 % IV SOLN
3.0000 g | Freq: Four times a day (QID) | INTRAVENOUS | Status: AC
  Administered 2023-08-17 – 2023-08-19 (×10): 3 g via INTRAVENOUS
  Filled 2023-08-17 (×10): qty 8

## 2023-08-17 NOTE — Plan of Care (Signed)
Problem: Education: Goal: Knowledge of General Education information will improve Description: Including pain rating scale, medication(s)/side effects and non-pharmacologic comfort measures Outcome: Progressing   Problem: Health Behavior/Discharge Planning: Goal: Ability to manage health-related needs will improve Outcome: Progressing   Problem: Clinical Measurements: Goal: Ability to maintain clinical measurements within normal limits will improve Outcome: Progressing Goal: Will remain free from infection Outcome: Progressing Goal: Diagnostic test results will improve Outcome: Progressing Goal: Respiratory complications will improve Outcome: Progressing Goal: Cardiovascular complication will be avoided Outcome: Progressing   Problem: Activity: Goal: Risk for activity intolerance will decrease Outcome: Progressing   Problem: Nutrition: Goal: Adequate nutrition will be maintained Outcome: Progressing   Problem: Coping: Goal: Level of anxiety will decrease Outcome: Progressing   Problem: Elimination: Goal: Will not experience complications related to bowel motility Outcome: Progressing Goal: Will not experience complications related to urinary retention Outcome: Progressing   Problem: Pain Managment: Goal: General experience of comfort will improve Outcome: Progressing   Problem: Safety: Goal: Ability to remain free from injury will improve Outcome: Progressing   Problem: Skin Integrity: Goal: Risk for impaired skin integrity will decrease Outcome: Progressing   Problem: Education: Goal: Knowledge of General Education information will improve Description: Including pain rating scale, medication(s)/side effects and non-pharmacologic comfort measures Outcome: Progressing   Problem: Health Behavior/Discharge Planning: Goal: Ability to manage health-related needs will improve Outcome: Progressing   Problem: Clinical Measurements: Goal: Ability to maintain  clinical measurements within normal limits will improve Outcome: Progressing   Problem: Clinical Measurements: Goal: Will remain free from infection Outcome: Progressing   Problem: Clinical Measurements: Goal: Diagnostic test results will improve Outcome: Progressing   Problem: Clinical Measurements: Goal: Respiratory complications will improve Outcome: Progressing   Problem: Clinical Measurements: Goal: Cardiovascular complication will be avoided Outcome: Progressing   Problem: Nutrition: Goal: Adequate nutrition will be maintained Outcome: Progressing   Problem: Coping: Goal: Level of anxiety will decrease Outcome: Progressing   Problem: Elimination: Goal: Will not experience complications related to bowel motility Outcome: Progressing   Problem: Elimination: Goal: Will not experience complications related to urinary retention Outcome: Progressing   Problem: Pain Managment: Goal: General experience of comfort will improve Outcome: Progressing   Problem: Safety: Goal: Ability to remain free from injury will improve Outcome: Progressing   Problem: Skin Integrity: Goal: Risk for impaired skin integrity will decrease Outcome: Progressing

## 2023-08-17 NOTE — Procedures (Signed)
Patient Name: William Baxter  MRN: [id number]  Epilepsy Attending: Charlsie Quest  Referring Physician/Provider: Leroy Sea, MD  Date: 08/17/2023  Duration: 25.57 mins  Patient history: 62 year old male with history of alcohol use presented with alcohol withdrawal seizure.  EEG to evaluate for seizure.  Level of alertness: Awake, asleep  AEDs during EEG study: Phenobarbital  Technical aspects: This EEG study was done with scalp electrodes positioned according to the 10-20 International system of electrode placement. Electrical activity was reviewed with band pass filter of 1-70Hz , sensitivity of 7 uV/mm, display speed of 19mm/sec with a 60Hz  notched filter applied as appropriate. EEG data were recorded continuously and digitally stored.  Video monitoring was available and reviewed as appropriate.  Description: No clear posterior dominant rhythm consists of 9-10 Hz activity of moderate voltage (25-35 uV) seen predominantly in posterior head regions, symmetric and reactive to eye opening and eye closing. Sleep was characterized by vertex waves, sleep spindles (12 to 14 Hz), maximal frontocentral region. There is an excessive amount of 15 to 18 Hz, beta activity distributed symmetrically and diffusely. Hyperventilation and photic stimulation were not performed.     ABNORMALITY - Excessive beta, generalized  IMPRESSION: This study is within normal limits. The excessive beta activity seen in the background is most likely due to the effect of benzodiazepine and is a benign EEG pattern. No seizures or epileptiform discharges were seen throughout the recording.  A normal interictal EEG does not exclude the diagnosis of epilepsy.  Herman Mell Annabelle Harman

## 2023-08-17 NOTE — Progress Notes (Signed)
EEG complete - results pending 

## 2023-08-17 NOTE — Evaluation (Signed)
Physical Therapy Evaluation Patient Details Name: William Baxter MRN: [id number] DOB: 1961/07/16 Today's Date: 08/17/2023  History of Present Illness  62 y.o. male presents 08/16/23 with alcoholic seizure withdrawals.Head CT unremarkable; going into DTs;   PMH significant for CAD, alcohol abuse, BPH and migraine  Clinical Impression   Pt admitted secondary to problem above with deficits below. PTA patient was independent with all mobility. Pt currently requires +2 moderate assist overall for basic mobility (not including ambulation as pt too obtunded to attempt). Anticipate good progress once sedating meds are out of his system. Anticipate patient will benefit from PT to address problems listed below.Will continue to follow acutely to maximize functional mobility independence and safety.           If plan is discharge home, recommend the following: A little help with walking and/or transfers;A little help with bathing/dressing/bathroom;Assistance with cooking/housework;Direct supervision/assist for medications management;Direct supervision/assist for financial management;Assist for transportation;Help with stairs or ramp for entrance;Supervision due to cognitive status   Can travel by private vehicle        Equipment Recommendations None recommended by PT  Recommendations for Other Services       Functional Status Assessment Patient has had a recent decline in their functional status and demonstrates the ability to make significant improvements in function in a reasonable and predictable amount of time.     Precautions / Restrictions Precautions Precautions: Fall Precaution Comments: agitation Restrictions Weight Bearing Restrictions: No      Mobility  Bed Mobility Overal bed mobility: Needs Assistance Bed Mobility: Rolling, Sidelying to Sit, Sit to Sidelying Rolling: Max assist Sidelying to sit: Total assist, +2 for physical assistance, HOB elevated     Sit to  sidelying: Max assist, +2 for physical assistance General bed mobility comments: Would expect quick return to baseline    Transfers Overall transfer level: Needs assistance Equipment used: None Transfers: Sit to/from Stand Sit to Stand: Min assist, +2 physical assistance           General transfer comment: became alert and stood up without prompts    Ambulation/Gait               General Gait Details: unsafe too lethargic  Stairs            Wheelchair Mobility     Tilt Bed    Modified Rankin (Stroke Patients Only)       Balance Overall balance assessment: Needs assistance Sitting-balance support: Feet supported Sitting balance-Leahy Scale: Poor     Standing balance support: Bilateral upper extremity supported Standing balance-Leahy Scale: Poor                               Pertinent Vitals/Pain Pain Assessment Faces Pain Scale: No hurt    Home Living Family/patient expects to be discharged to:: Private residence Living Arrangements: Spouse/significant other Available Help at Discharge: Family;Available 24 hours/day Type of Home: Apartment Home Access: Stairs to enter   [employer] of Steps: full flight to 2nd floor apartment.   Home Layout: One level Home Equipment: None      Prior Function Prior Level of Function : Independent/Modified Independent             Mobility Comments: Walks without an AD ADLs Comments: Ind with ADL and iADL, no longer drives.     Extremity/Trunk Assessment   Upper Extremity Assessment Upper Extremity Assessment: Defer to OT evaluation  Lower Extremity Assessment Lower Extremity Assessment: Difficult to assess due to impaired cognition    Cervical / Trunk Assessment Cervical / Trunk Assessment: Normal  Communication   Communication Communication: Difficulty following commands/understanding;Difficulty communicating thoughts/reduced clarity of speech (very low  volume) Following commands: Follows one step commands inconsistently Cueing Techniques: Verbal cues;Tactile cues  Cognition Arousal: Suspect due to medications, Obtunded Behavior During Therapy: Restless Overall Cognitive Status: Difficult to assess                                 General Comments: able to state name, DOB, [place]        General Comments General comments (skin integrity, edema, etc.): Fiance present and providing +2 assist    Exercises     Assessment/Plan    PT Assessment Patient needs continued PT services  PT Problem List Decreased activity tolerance;Decreased balance;Decreased mobility;Decreased cognition;Decreased knowledge of use of DME;Decreased safety awareness;Decreased knowledge of precautions       PT Treatment Interventions DME instruction;Gait training;Stair training;Functional mobility training;Therapeutic activities;Therapeutic exercise;Cognitive remediation;Patient/family education    PT Goals (Current goals can be found in the Care Plan section)  Acute Rehab PT Goals Patient Stated Goal: unable to state PT Goal Formulation: With family Time For Goal Achievement: 08/31/23 Potential to Achieve Goals: Good    Frequency Min 1X/week     Co-evaluation               AM-PAC PT "6 Clicks" Mobility  Outcome Measure Help needed turning from your back to your side while in a flat bed without using bedrails?: A Lot Help needed moving from lying on your back to sitting on the side of a flat bed without using bedrails?: Total Help needed moving to and from a bed to a chair (including a wheelchair)?: Total Help needed standing up from a chair using your arms (e.g., wheelchair or bedside chair)?: Total Help needed to walk in hospital room?: Total Help needed climbing 3-5 steps with a railing? : Total 6 Click Score: 7    End of Session   Activity Tolerance: Patient limited by lethargy Patient left: in bed;with call bell/phone  within reach;with bed alarm set;with family/visitor present   PT Visit Diagnosis: Difficulty in walking, not elsewhere classified (R26.2)    Time: 8657-8469 PT Time Calculation (min) (ACUTE ONLY): 18 min   Charges:   PT Evaluation $PT Eval Low Complexity: 1 Low   PT General Charges $$ ACUTE PT VISIT: 1 Visit          Jerolyn Center, PT Acute Rehabilitation Services  Office [phone]   Zena Amos 08/17/2023, 4:15 PM

## 2023-08-17 NOTE — Progress Notes (Addendum)
Initial Nutrition Assessment  DOCUMENTATION CODES:   Non-severe (moderate) malnutrition in context of social or environmental circumstances  INTERVENTION:  Snack in evening Assist with meal service.    NUTRITION DIAGNOSIS:   Moderate Malnutrition related to social / environmental circumstances as evidenced by meal completion < 25%, moderate fat depletion, moderate muscle depletion.    GOAL:   Patient will meet greater than or equal to 90% of their needs    MONITOR:   PO intake, Supplement acceptance, Labs, Weight trends  REASON FOR ASSESSMENT:   Consult Assessment of nutrition requirement/status  ASSESSMENT:  Admitted with ETOH withdraw seizures.  Drinking 2 days prior.  Pmhx; CAD, alcohol abuse, BPH, and migraines. Noticed missed meal. Patient sleeping and non responsive to RD voice.  Fiance in room.  Stated that he was not eating due to drinking.  She was not sure if he had any weight loss.  She reported that he stopped drinking cold [place].  She was able to start getting him to eat a little. He is more restless at night. Disabled and un employed at this time.  Admit weight: 70.3 kg Current weight: 70.3 kg  Weight history; 08/15/23 70.3 kg  04/03/22 77.1 kg      Average Meal Intake: No current documentation available   Nutritionally Relevant Medications: Scheduled Meds:  feeding supplement  237 mL Oral BID BM   folic acid  1 mg Oral Daily   multivitamin with minerals  1 tablet Oral Daily   phenobarbital  64.8 mg Oral Q12H   tamsulosin  0.4 mg Oral Daily   thiamine  100 mg Oral Daily   Or   thiamine  100 mg Intravenous Daily    PRN Meds:.acetaminophen **OR** acetaminophen, cloNIDine, LORazepam **OR** LORazepam, ondansetron **OR** ondansetron (ZOFRAN) IV  Labs Reviewed: Na;131, K; 3.4    NUTRITION - FOCUSED PHYSICAL EXAM:  Flowsheet Row Most Recent Value  Orbital Region Mild depletion  Upper Arm Region Moderate depletion  Thoracic and Lumbar  Region Moderate depletion  Buccal Region Mild depletion  Temple Region Moderate depletion  Clavicle Bone Region Moderate depletion  Clavicle and Acromion Bone Region Moderate depletion  Scapular Bone Region Unable to assess  Dorsal Hand Unable to assess  Patellar Region Mild depletion  Anterior Thigh Region Mild depletion  Posterior Calf Region Mild depletion  Edema (RD Assessment) None  Hair Reviewed  Eyes Reviewed  Mouth Reviewed  Skin Reviewed  Nails Reviewed       Diet Order:   Diet Order             Diet Heart Room service appropriate? Yes; Fluid consistency: Thin  Diet effective now                   EDUCATION NEEDS:   Not appropriate for education at this time  Skin:  Skin Assessment: Reviewed RN Assessment  Last BM:  10/13  Height:   Ht Readings from Last 1 Encounters:  08/15/23 5\' 11"  (1.803 m)    Weight:   Wt Readings from Last 1 Encounters:  08/15/23 70.3 kg    Ideal Body Weight:     BMI:  Body mass index is 21.62 kg/m.  Estimated Nutritional Needs:   Kcal:  2100-2450 kcal  Protein:  90-105 g  Fluid:  26ml/kcal    Jamelle Haring RDN, LDN Clinical Dietitian  RDN pager # available on Amion

## 2023-08-17 NOTE — Progress Notes (Signed)
PROGRESS NOTE                                                                                                                                                                                                             Patient Demographics:    William Baxter, is a 62 y.o. male, DOB - 1961-02-20, WGN:[id number]  Outpatient Primary [facility] for the [facility] is [facility]    LOS - 2  Admit date - 08/15/2023    Chief Complaint  Patient presents with   seizure/withdrawal   Seizures       Brief Narrative (HPI from H&P)   62 y.o. male past medical history significant for CAD, alcohol abuse, BPH and migraine presents with alcoholic seizure withdrawals.  According to patient's fianc he is a heavy alcohol user and abruptly quit drinking 2 days prior to his seizure, was brought to the ER where head CT was unremarkable, he was found to be going into DTs and admitted to the hospital.   Subjective:    62 [name] today has, No headache, No chest pain, no SOB   Assessment  & Plan :    DTs with alcohol withdrawal seizure.  Head CT stable, placed on phenobarb taper along with CIWA protocol, DTs have improved, continue to monitor, will check a EEG to rule out any ongoing seizure activity which appears unlikely, he has been counseled to continue to abstain from alcohol.      Mildly elevated LFTs.  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  Abstain from alcohol.    Chronic thrombocytopenia.  Due to above..  Mildly elevated CKs.  Due to rhabdo.  Hydrate, monitor CK levels, check EEG to rule out ongoing seizure activity.  CAD s/p percutaneous angioplasty.  Hold statins due to rising CKs, not on antiplatelets due to underlying thrombocytopenia.  Supportive care.  Low-dose  beta-blocker.  Hypertension.  Beta-blocker and as needed Catapres.  Low-grade fever on 08/17/2023.  At risk for aspiration, placed on Unasyn, chest x-ray, bladder scan, UA, check CBC and inflammatory markers.  Mild hyponatremia.  Gentle IV fluids      Condition -  Guarded  Family Communication  :   New onset  bedside on 08/17/2023  Code Status : Full code  Consults  :  None  PUD Prophylaxis :  PPI   Procedures  :     CT head.  Nonacute.    Right upper quadrant ultrasound. Hepatic steatosis without focal liver lesions      Disposition Plan  :    Status is: Inpatient   DVT Prophylaxis  :    SCDs Start: 08/15/23 2044    Lab Results  Component Value Date   PLT 36 (L) 08/16/2023    Diet :  Diet Order             Diet Heart Room service appropriate? Yes; Fluid consistency: Thin  Diet effective now                    Inpatient Medications  Scheduled Meds:  feeding supplement  237 mL Oral BID BM   folic acid  1 mg Oral Daily   multivitamin with minerals  1 tablet Oral Daily   phenobarbital  64.8 mg Oral Q12H   Followed by   Melene Muller ON 08/18/2023] phenobarbital  32.4 mg Oral Q12H   tamsulosin  0.4 mg Oral Daily   thiamine  100 mg Oral Daily   Or   thiamine  100 mg Intravenous Daily   Continuous Infusions:  dextrose 5 % 1,000 mL with potassium chloride 10 mEq/L Pediatric IV infusion     PRN Meds:.acetaminophen **OR** acetaminophen, LORazepam **OR** LORazepam, ondansetron **OR** ondansetron (ZOFRAN) IV  Antibiotics  :    Anti-infectives (From admission, onward)    None         Objective:   Vitals:   08/17/23 0000 08/17/23 0304 08/17/23 0434 08/17/23 0840  BP:  (!) 142/98  (!) 122/100  Pulse: (!) 116     Resp:  (!) 23  20  Temp:  99.6 F (37.6 C) 100 F (37.8 C) 99.6 F (37.6 C)  TempSrc:  Axillary Axillary Oral  SpO2:    100%  Weight:      Height:        Wt Readings from Last 3 Encounters:  08/15/23 70.3 kg  04/03/22 77.1 kg   08/18/20 77 kg     Intake/Output Summary (Last 24 hours) at 08/17/2023 1002 Last data filed at 08/17/2023 0324 Gross per 24 hour  Intake --  Output 1000 ml  Net -1000 ml     Physical Exam  Awake Alert, No new F.N deficits, Normal affect .AT,PERRAL Supple Neck, No JVD,   Symmetrical Chest wall movement, Good air movement bilaterally, CTAB RRR,No Gallops,Rubs or new Murmurs,  +ve B.Sounds, Abd Soft, No tenderness,   No Cyanosis, Clubbing or edema     Data Review:    Recent Labs  Lab 08/15/23 1800 08/16/23 1248  WBC 4.6 3.2*  HGB 12.2* 13.3  HCT 35.6* 37.3*  PLT 38* 36*  MCV 93.9 94.2  MCH 32.2 33.6  MCHC 34.3 35.7  RDW 14.6 14.2  LYMPHSABS 0.4*  --   MONOABS 0.3  --   EOSABS 0.0  --   BASOSABS 0.0  --     Recent Labs  Lab 08/15/23 1800 08/15/23 1918 08/15/23 2215 08/16/23 1248  NA 132*  --  129* 131*  K 4.1  --  3.4* 3.4*  CL 97*  --  93* 95*  CO2 23  --  23 22  ANIONGAP 12  --  13 14  GLUCOSE 140*  --  100* 93  BUN <  5*  --  <5* <5*  CREATININE 0.80  --  0.78 0.72  AST 127*  --   --  110*  ALT 51*  --   --  47*  ALKPHOS 67  --   --  59  BILITOT 1.0  --   --  1.3*  ALBUMIN 3.1*  --   --  3.6  INR 1.2  --   --   --   TSH  --   --  0.530  --   AMMONIA  --  39*  --   --   MG  --   --  1.9 1.7  CALCIUM 8.9  --  8.9 9.1      Recent Labs  Lab 08/15/23 1800 08/15/23 1918 08/15/23 2215 08/16/23 1248  INR 1.2  --   --   --   TSH  --   --  0.530  --   AMMONIA  --  39*  --   --   MG  --   --  1.9 1.7  CALCIUM 8.9  --  8.9 9.1    --------------------------------------------------------------------------------------------------------------- Lab Results  Component Value Date   CHOL 176 12/16/2013   HDL 77 12/16/2013   LDLCALC 90 12/16/2013   TRIG 43 12/16/2013   CHOLHDL 2.3 12/16/2013    No results found for: "HGBA1C" Recent Labs    08/15/23 2215  TSH 0.530   Recent Labs    08/15/23 2215 08/16/23 1248  VITAMINB12  --  694   FOLATE  --  7.7  FERRITIN 1,400*  --   TIBC 293  --   IRON 148  --   RETICCTPCT 0.9  --       Radiology Reports CT HEAD WO CONTRAST ( )  Result Date: 08/15/2023 CLINICAL DATA:  Seizure EXAM: CT HEAD WITHOUT CONTRAST TECHNIQUE: Contiguous axial images were obtained from the base of the skull through the vertex without intravenous contrast. RADIATION DOSE REDUCTION: This exam was performed according to the departmental dose-optimization program which includes automated exposure control, adjustment of the mA and/or kV according to patient size and/or use of iterative reconstruction technique. COMPARISON:  12/30/2016 FINDINGS: Brain: There is no mass, hemorrhage or extra-axial collection. The appearance of the white matter is normal for the patient's age. There is generalized atrophy. Vascular: Atherosclerotic calcification of the internal carotid arteries at the skull base. No abnormal hyperdensity of the major intracranial arteries or dural venous sinuses. Skull: The visualized skull base, calvarium and extracranial soft tissues are normal. Sinuses/Orbits: Small amount of right mastoid fluid. Minimal left sphenoid sinus mucosal thickening. Normal orbits. IMPRESSION: 1. No acute intracranial abnormality. 2. Generalized atrophy. 3. Small amount of right mastoid fluid. Electronically Signed   By: Deatra Robinson M.D.   On: 08/15/2023 21:22   US Abdomen Limited RUQ (LIVER/GB)  Result Date: 08/15/2023 CLINICAL DATA:  Elevated liver function tests. EXAM: ULTRASOUND ABDOMEN LIMITED RIGHT UPPER QUADRANT COMPARISON:  None Available. FINDINGS: Gallbladder: No gallstones or wall thickening visualized (1.2 mm). No sonographic Murphy sign noted by sonographer. Common bile duct: Diameter: 4.3 mm Liver: No focal lesion identified. Diffusely increased echogenicity of the liver parenchyma is noted. Portal vein is patent on color Doppler imaging with normal direction of blood flow towards the liver. Other: None.  IMPRESSION: Hepatic steatosis without focal liver lesions. Electronically Signed   By: Aram Candela M.D.   On: 08/15/2023 20:40   DG CHEST PORT 1 VIEW  Result Date: 08/15/2023 CLINICAL DATA:  Dyspnea EXAM:  PORTABLE CHEST 1 VIEW COMPARISON:  02/06/2020 FINDINGS: Normal cardiac size. No focal opacity or pleural effusion. No pneumothorax. IMPRESSION: No active disease. Electronically Signed   By: Jasmine Pang M.D.   On: 08/15/2023 20:28      Signature  -   Susa Raring M.D on 08/17/2023 at 10:02 AM   -  To page go to [email or web address]

## 2023-08-17 NOTE — Progress Notes (Signed)
Pharmacy Antibiotic Note  William Baxter is a 62 y.o. male admitted on 08/15/2023 with DTs with alcohol withdrawal seizure.  Pharmacy has been consulted for Unasyn dosing aspiration pneumonia.  Plan: Unasyn 3g IV q6h Follow up renal function, cultures as available, clinical progress, length of tx  Height: 5\' 11"  (180.3 cm) Weight: 70.3 kg (155 lb) IBW/kg (Calculated) : 75.3  Temp (24hrs), Avg:99.1 F (37.3 C), Min:98.2 F (36.8 C), Max:100 F (37.8 C)  Recent Labs  Lab 08/15/23 1800 08/15/23 2215 08/16/23 1248 08/17/23 0818 08/17/23 1025  WBC 4.6  --  3.2*  --  4.3  CREATININE 0.80 0.78 0.72 0.79 0.73    Estimated Creatinine Clearance: 94 mL/min (by C-G formula based on SCr of 0.73 mg/dL).    Allergies  Allergen Reactions   Penicillins Nausea And Vomiting    Antimicrobials this admission: 10/15 Unasyn >>  Dose adjustments this admission:  Microbiology results:  Thank you for allowing pharmacy to be a part of this patient's care.  Loralee Pacas, PharmD, BCPS 08/17/2023 1:13 PM

## 2023-08-18 DIAGNOSIS — F10931 Alcohol use, unspecified with withdrawal delirium: Secondary | ICD-10-CM | POA: Diagnosis not present

## 2023-08-18 DIAGNOSIS — R7989 Other specified abnormal findings of blood chemistry: Secondary | ICD-10-CM | POA: Diagnosis not present

## 2023-08-18 DIAGNOSIS — E871 Hypo-osmolality and hyponatremia: Secondary | ICD-10-CM | POA: Diagnosis not present

## 2023-08-18 LAB — CBC WITH DIFFERENTIAL/PLATELET
Abs Immature Granulocytes: 0.01 10*3/uL (ref 0.00–0.07)
Basophils Absolute: 0 10*3/uL (ref 0.0–0.1)
Basophils Relative: 1 %
Eosinophils Absolute: 0.1 10*3/uL (ref 0.0–0.5)
Eosinophils Relative: 1 %
HCT: 39.8 % (ref 39.0–52.0)
Hemoglobin: 14.1 g/dL (ref 13.0–17.0)
Immature Granulocytes: 0 %
Lymphocytes Relative: 24 %
Lymphs Abs: 0.9 10*3/uL (ref 0.7–4.0)
MCH: 32.9 pg (ref 26.0–34.0)
MCHC: 35.4 g/dL (ref 30.0–36.0)
MCV: 93 fL (ref 80.0–100.0)
Monocytes Absolute: 0.3 10*3/uL (ref 0.1–1.0)
Monocytes Relative: 9 %
Neutro Abs: 2.3 10*3/uL (ref 1.7–7.7)
Neutrophils Relative %: 65 %
Platelets: 42 10*3/uL — ABNORMAL LOW (ref 150–400)
RBC: 4.28 MIL/uL (ref 4.22–5.81)
RDW: 14.8 % (ref 11.5–15.5)
WBC: 3.6 10*3/uL — ABNORMAL LOW (ref 4.0–10.5)
nRBC: 0 % (ref 0.0–0.2)

## 2023-08-18 LAB — CK: Total CK: 6720 U/L — ABNORMAL HIGH (ref 49–397)

## 2023-08-18 LAB — COMPREHENSIVE METABOLIC PANEL
ALT: 39 U/L (ref 0–44)
AST: 146 U/L — ABNORMAL HIGH (ref 15–41)
Albumin: 3.4 g/dL — ABNORMAL LOW (ref 3.5–5.0)
Alkaline Phosphatase: 60 U/L (ref 38–126)
Anion gap: 13 (ref 5–15)
BUN: 6 mg/dL — ABNORMAL LOW (ref 8–23)
CO2: 18 mmol/L — ABNORMAL LOW (ref 22–32)
Calcium: 9.2 mg/dL (ref 8.9–10.3)
Chloride: 100 mmol/L (ref 98–111)
Creatinine, Ser: 0.71 mg/dL (ref 0.61–1.24)
GFR, Estimated: >60 mL/min (ref >60)
Glucose, Bld: 91 mg/dL (ref 70–99)
Potassium: 4 mmol/L (ref 3.5–5.1)
Sodium: 131 mmol/L — ABNORMAL LOW (ref 135–145)
Total Bilirubin: 0.9 mg/dL (ref 0.3–1.2)
Total Protein: 7.6 g/dL (ref 6.5–8.1)

## 2023-08-18 LAB — PHOSPHORUS: Phosphorus: 3.1 mg/dL (ref 2.5–4.6)

## 2023-08-18 LAB — MAGNESIUM: Magnesium: 1.7 mg/dL (ref 1.7–2.4)

## 2023-08-18 LAB — VITAMIN B1: Vitamin B1 (Thiamine): 66.8 nmol/L (ref 66.5–200.0)

## 2023-08-18 LAB — PROCALCITONIN: Procalcitonin: 0.1 ng/mL

## 2023-08-18 LAB — BRAIN NATRIURETIC PEPTIDE: B Natriuretic Peptide: 68.2 pg/mL (ref 0.0–100.0)

## 2023-08-18 LAB — C-REACTIVE PROTEIN: CRP: 3.6 mg/dL — ABNORMAL HIGH (ref <1.0)

## 2023-08-18 MED ORDER — THIAMINE MONONITRATE 100 MG PO TABS
100.0000 mg | ORAL_TABLET | Freq: Every day | ORAL | Status: DC
  Administered 2023-08-19 – 2023-08-20 (×2): 100 mg via ORAL
  Filled 2023-08-18 (×2): qty 1

## 2023-08-18 MED ORDER — THIAMINE HCL 100 MG/ML IJ SOLN: 100.0000 mg | Freq: Every day | INTRAMUSCULAR | Status: DC

## 2023-08-18 MED ORDER — THIAMINE HCL 100 MG/ML IJ SOLN
500.0000 mg | Freq: Three times a day (TID) | INTRAVENOUS | Status: AC
  Administered 2023-08-18 – 2023-08-19 (×3): 500 mg via INTRAVENOUS
  Filled 2023-08-18 (×4): qty 5

## 2023-08-18 MED ORDER — MAGNESIUM SULFATE 2 GM/50ML IV SOLN
2.0000 g | Freq: Once | INTRAVENOUS | Status: AC
  Administered 2023-08-18: 2 g via INTRAVENOUS
  Filled 2023-08-18: qty 50

## 2023-08-18 NOTE — Progress Notes (Signed)
Ok to stop unasyn 10/17 and give magnesium sulfate 2g x1 per Dr. Randol Kern.  Ulyses Southward, PharmD, BCIDP, AAHIVP, CPP Infectious Disease Pharmacist 08/18/2023 1:01 PM

## 2023-08-18 NOTE — TOC Progression Note (Signed)
Transition of Care [facility]) - Progression Note    Patient Details  Name: William Baxter MRN: [id number] Date of Birth: [birth year]  [facility]) CM/SW Contact  Emeril Stille Reeves Forth, Student-Social Work Phone Number: 08/18/2023, 3:27 PM  Clinical Narrative:    MSW Intern spoke with patient's girlfriend about resources for alcohol use. Patients girlfriend was very thankful and accepted the resource packets MSW Intern offered her. MSW Intern provided girlfriend with resource packet and [employer].         Expected Discharge Plan and Services                                               Social Determinants of Health (SDOH) Interventions SDOH Screenings   Food Insecurity: No Food Insecurity (08/16/2023)  Housing: Low Risk  (08/16/2023)  Transportation Needs: No Transportation Needs (08/16/2023)  Utilities: Not At Risk (08/16/2023)  Tobacco Use: High Risk (08/15/2023)    Readmission Risk Interventions     No data to display

## 2023-08-18 NOTE — Progress Notes (Addendum)
PROGRESS NOTE                                                                                                                                                                                                             Patient Demographics:    William Baxter, is a 62 y.o. male, DOB - Jul 04, 1961, MWU:[id number]  Outpatient Primary MD for the [facility] is [facility]    LOS - 3  Admit date - 08/15/2023    Chief Complaint  Patient presents with   seizure/withdrawal   Seizures       Brief Narrative (HPI from H&P)     62 y.o. male past medical history significant for CAD, alcohol abuse, BPH and migraine presents with alcoholic seizure withdrawals.  According to patient's fianc he is a heavy alcohol user and abruptly quit drinking 2 days prior to his seizure, was brought to the ER where head CT was unremarkable, he was found to be going into DTs and admitted to the hospital.   Subjective:    William Baxter today has, No headache, No chest pain, no SOB   Assessment  & Plan :    DTs with alcohol withdrawal seizure.  -  Head CT stable, placed on phenobarb taper along with CIWA protocol, DTs have improved, continue to monitor, EEG with no evidence of seizures  -, he has been counseled to continue to abstain from alcohol.     Acute metabolic encephalopathy -He is significantly somnolent, altered this morning, this is most likely secondary to withdrawals, as well possibly phenobarbital taper is contributing, I will start on high-dose IV thiamine empirically   Mildly elevated LFTs.  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  Abstain from alcohol.    Chronic thrombocytopenia.  Due to above..  Mildly elevated CKs.  Due to rhabdo.  Hydrate, monitor CK levels,  continue to monitor and CK still trending up  CAD s/p percutaneous angioplasty.  Hold statins due to rising CKs, not on antiplatelets due to underlying thrombocytopenia.  Supportive care.  Low-dose beta-blocker.  Hypertension.  Beta-blocker and as needed Catapres.  Fever 10/15 -Patient high risk for aspiration, so started empirically on Unasyn, his chest x-ray is  reassuring, UA is negative, CBC and Pro-Cal is reassuring, will DC antibiotic in 24 hours.     Mild hyponatremia.  Gentle IV fluids      Condition -  Guarded  Family Communication  : Fianc at bedside  Code Status : Full code  Consults  :  None  PUD Prophylaxis :  PPI   Procedures  :     CT head.  Nonacute.    Right upper quadrant ultrasound. Hepatic steatosis without focal liver lesions      Disposition Plan  :    Status is: Inpatient   DVT Prophylaxis  :    SCDs Start: 08/15/23 2044    Lab Results  Component Value Date   PLT 42 (L) 08/18/2023    Diet :  Diet Order             Diet Heart Room service appropriate? Yes; Fluid consistency: Thin  Diet effective now                    Inpatient Medications  Scheduled Meds:  feeding supplement  237 mL Oral BID BM   folic acid  1 mg Oral Daily   metoprolol tartrate  50 mg Oral BID   multivitamin with minerals  1 tablet Oral Daily   pantoprazole  40 mg Oral Daily   phenobarbital  32.4 mg Oral Q12H   tamsulosin  0.4 mg Oral Daily   [START ON 08/19/2023] thiamine  100 mg Oral Daily   Or   [START ON 08/19/2023] thiamine  100 mg Intravenous Daily   Continuous Infusions:  ampicillin-sulbactam (UNASYN) IV 3 g (08/18/23 1437)   magnesium sulfate bolus IVPB 2 g (08/18/23 1431)   thiamine (VITAMIN B1) injection 500 mg (08/18/23 1155)   PRN Meds:.acetaminophen **OR** acetaminophen, cloNIDine, LORazepam **OR** LORazepam, ondansetron **OR** ondansetron (ZOFRAN) IV  Antibiotics  :    Anti-infectives (From admission, onward)    Start      Dose/Rate Route Frequency Ordered Stop   08/17/23 1130  Ampicillin-Sulbactam (UNASYN) 3 g in sodium chloride 0.9 % 100 mL IVPB        3 g 200 mL/hr over 30 Minutes Intravenous Every 6 hours 08/17/23 1043 08/19/23 2359         Objective:   Vitals:   08/18/23 0425 08/18/23 0800 08/18/23 0943 08/18/23 1200  BP:  125/81 125/81 113/84  Pulse:  81 80 77  Resp:  20  18  Temp: 98.1 F (36.7 C) 98.1 F (36.7 C)  98.2 F (36.8 C)  TempSrc: Axillary Oral  Oral  SpO2:    97%  Weight:      Height:        Wt Readings from Last 3 Encounters:  08/15/23 70.3 kg  04/03/22 77.1 kg  08/18/20 77 kg     Intake/Output Summary (Last 24 hours) at 08/18/2023 1446 Last data filed at 08/18/2023 0300 Gross per 24 hour  Intake --  Output 900 ml  Net -900 ml     Physical Exam  Somnolent, confused, unable to follow commands or answer questions appropriately Symmetrical Chest wall movement, Good air movement bilaterally, CTAB RRR,No Gallops,Rubs or new Murmurs, No Parasternal Heave +ve B.Sounds, Abd Soft, No tenderness, No rebound - guarding or rigidity. No Cyanosis, Clubbing or edema, No new Rash or bruise       Data Review:    Recent Labs  Lab 08/15/23 1800 08/16/23 1248 08/17/23 1025 08/18/23 0346  WBC 4.6 3.2* 4.3  3.6*  HGB 12.2* 13.3 13.8 14.1  HCT 35.6* 37.3* 39.3 39.8  PLT 38* 36* 39* 42*  MCV 93.9 94.2 92.0 93.0  MCH 32.2 33.6 32.3 32.9  MCHC 34.3 35.7 35.1 35.4  RDW 14.6 14.2 14.5 14.8  LYMPHSABS 0.4*  --  0.9 0.9  MONOABS 0.3  --  0.5 0.3  EOSABS 0.0  --  0.1 0.1  BASOSABS 0.0  --  0.0 0.0    Recent Labs  Lab 08/15/23 1800 08/15/23 1918 08/15/23 2215 08/16/23 1248 08/17/23 0818 08/17/23 1025 08/18/23 0346  NA 132*  --  129* 131* 130* 128* 131*  K 4.1  --  3.4* 3.4* 4.1 3.7 4.0  CL 97*  --  93* 95* 98 97* 100  CO2 23  --  23 22 20* 20* 18*  ANIONGAP 12  --  13 14 12 11 13   GLUCOSE 140*  --  100* 93 90 99 91  BUN <5*  --  <5* <5* <5* 5* 6*  CREATININE  0.80  --  0.78 0.72 0.79 0.73 0.71  AST 127*  --   --  110* 108*  --  146*  ALT 51*  --   --  47* 42  --  39  ALKPHOS 67  --   --  59 64  --  60  BILITOT 1.0  --   --  1.3* 1.1  --  0.9  ALBUMIN 3.1*  --   --  3.6 3.7  --  3.4*  CRP  --   --   --   --   --  2.4* 3.6*  PROCALCITON  --   --   --   --   --  0.14 0.10  INR 1.2  --   --   --   --   --   --   TSH  --   --  0.530  --   --   --   --   AMMONIA  --  39*  --   --   --   --   --   BNP  --   --   --   --   --   --  68.2  MG  --   --  1.9 1.7 1.7 1.9 1.7  CALCIUM 8.9  --  8.9 9.1 9.9 9.5 9.2      Recent Labs  Lab 08/15/23 1800 08/15/23 1918 08/15/23 2215 08/16/23 1248 08/17/23 0818 08/17/23 1025 08/18/23 0346  CRP  --   --   --   --   --  2.4* 3.6*  PROCALCITON  --   --   --   --   --  0.14 0.10  INR 1.2  --   --   --   --   --   --   TSH  --   --  0.530  --   --   --   --   AMMONIA  --  39*  --   --   --   --   --   BNP  --   --   --   --   --   --  68.2  MG  --   --  1.9 1.7 1.7 1.9 1.7  CALCIUM 8.9  --  8.9 9.1 9.9 9.5 9.2    --------------------------------------------------------------------------------------------------------------- Lab Results  Component Value Date   CHOL 176 12/16/2013   HDL 77 12/16/2013   LDLCALC 90 12/16/2013  TRIG 43 12/16/2013   CHOLHDL 2.3 12/16/2013    No results found for: "HGBA1C" Recent Labs    08/15/23 2215  TSH 0.530   Recent Labs    08/15/23 2215 08/16/23 1248  VITAMINB12  --  694  FOLATE  --  7.7  FERRITIN 1,400*  --   TIBC 293  --   IRON 148  --   RETICCTPCT 0.9  --       Radiology Reports EEG adult  Result Date: 08/17/2023 Charlsie Quest, MD     08/17/2023  2:34 PM Patient Name: William Baxter MRN: [id number] Epilepsy Attending: Charlsie Quest Referring Physician/Provider: Leroy Sea, MD Date: 08/17/2023 Duration: 25.57 mins Patient history: 62 year old male with history of alcohol use presented with alcohol withdrawal seizure.  EEG to  evaluate for seizure. Level of alertness: Awake, asleep AEDs during EEG study: Phenobarbital Technical aspects: This EEG study was done with scalp electrodes positioned according to the 10-20 International system of electrode placement. Electrical activity was reviewed with band pass filter of 1-70Hz , sensitivity of 7 uV/mm, display speed of 16mm/sec with a 60Hz  notched filter applied as appropriate. EEG data were recorded continuously and digitally stored.  Video monitoring was available and reviewed as appropriate. Description: No clear posterior dominant rhythm consists of 9-10 Hz activity of moderate voltage (25-35 uV) seen predominantly in posterior head regions, symmetric and reactive to eye opening and eye closing. Sleep was characterized by vertex waves, sleep spindles (12 to 14 Hz), maximal frontocentral region. There is an excessive amount of 15 to 18 Hz, beta activity distributed symmetrically and diffusely. Hyperventilation and photic stimulation were not performed.   ABNORMALITY - Excessive beta, generalized IMPRESSION: This study is within normal limits. The excessive beta activity seen in the background is most likely due to the effect of benzodiazepine and is a benign EEG pattern. No seizures or epileptiform discharges were seen throughout the recording. A normal interictal EEG does not exclude the diagnosis of epilepsy. Charlsie Quest   DG Chest Port 1 View  Result Date: 08/17/2023 CLINICAL DATA:  Shortness of breath.  Seizures. EXAM: PORTABLE CHEST 1 VIEW COMPARISON:  08/15/2023 FINDINGS: Artifact overlies the chest. Heart size is normal. Lungs are clear. The vascularity is normal. No effusions. No abnormal bone finding. IMPRESSION: No active disease. Electronically Signed   By: Paulina Fusi M.D.   On: 08/17/2023 12:55   CT HEAD WO CONTRAST ( )  Result Date: 08/15/2023 CLINICAL DATA:  Seizure EXAM: CT HEAD WITHOUT CONTRAST TECHNIQUE: Contiguous axial images were obtained from the base  of the skull through the vertex without intravenous contrast. RADIATION DOSE REDUCTION: This exam was performed according to the departmental dose-optimization program which includes automated exposure control, adjustment of the mA and/or kV according to patient size and/or use of iterative reconstruction technique. COMPARISON:  12/30/2016 FINDINGS: Brain: There is no mass, hemorrhage or extra-axial collection. The appearance of the white matter is normal for the patient's age. There is generalized atrophy. Vascular: Atherosclerotic calcification of the internal carotid arteries at the skull base. No abnormal hyperdensity of the major intracranial arteries or dural venous sinuses. Skull: The visualized skull base, calvarium and extracranial soft tissues are normal. Sinuses/Orbits: Small amount of right mastoid fluid. Minimal left sphenoid sinus mucosal thickening. Normal orbits. IMPRESSION: 1. No acute intracranial abnormality. 2. Generalized atrophy. 3. Small amount of right mastoid fluid. Electronically Signed   By: Deatra Robinson M.D.   On: 08/15/2023 21:22   US Abdomen Limited  RUQ (LIVER/GB)  Result Date: 08/15/2023 CLINICAL DATA:  Elevated liver function tests. EXAM: ULTRASOUND ABDOMEN LIMITED RIGHT UPPER QUADRANT COMPARISON:  None Available. FINDINGS: Gallbladder: No gallstones or wall thickening visualized (1.2 mm). No sonographic Murphy sign noted by sonographer. Common bile duct: Diameter: 4.3 mm Liver: No focal lesion identified. Diffusely increased echogenicity of the liver parenchyma is noted. Portal vein is patent on color Doppler imaging with normal direction of blood flow towards the liver. Other: None. IMPRESSION: Hepatic steatosis without focal liver lesions. Electronically Signed   By: Aram Candela M.D.   On: 08/15/2023 20:40   DG CHEST PORT 1 VIEW  Result Date: 08/15/2023 CLINICAL DATA:  Dyspnea EXAM: PORTABLE CHEST 1 VIEW COMPARISON:  02/06/2020 FINDINGS: Normal cardiac size. No  focal opacity or pleural effusion. No pneumothorax. IMPRESSION: No active disease. Electronically Signed   By: Jasmine Pang M.D.   On: 08/15/2023 20:28      Signature  -   Huey Bienenstock M.D on 08/18/2023 at 2:46 PM   -  To page go to [email or web address]

## 2023-08-18 NOTE — Progress Notes (Signed)
Physical Therapy Treatment Patient Details Name: William Baxter MRN: [id number] DOB: 12-May-1961 Today's Date: 08/18/2023   History of Present Illness 62 y.o. male presents 08/16/23 with alcoholic seizure withdrawals. Head CT unremarkable; going into DTs;   PMH significant for CAD, alcohol abuse, BPH and migraine    PT Comments  Pt received in supine, somnolent but able to be awoken to verbal cues, fiance present and supportive. Pt needing increased time/heavy cues for attention to task and frequent cues to open his eyes during session. Pt mildly impulsive but will follow cues when given and participatory, mild dysarthria/disorganized speech, anticipate due to withdrawal but RN/MD notified of pt coughing while attempting to take sips of water. Pt BP soft while pt standing (BP 95/62 standing). Pt needing up to modA (+2 safety) for bed mobility and transfers this date with HHA for standing balance. Not yet able to progress gait due to pt fatigue/drowsiness contributing to pt difficulty following commands safely and BP drop in standing/increased risk of syncope. Pt continues to benefit from PT services to progress toward functional mobility goals.    If plan is discharge home, recommend the following: A little help with bathing/dressing/bathroom;Assistance with cooking/housework;Direct supervision/assist for medications management;Direct supervision/assist for financial management;Assist for transportation;Help with stairs or ramp for entrance;Supervision due to cognitive status;A lot of help with walking and/or transfers   Can travel by private vehicle        Equipment Recommendations  None recommended by PT (TBD)    Recommendations for Other Services Speech consult (coughing with thin liquids)     Precautions / Restrictions Precautions Precautions: Fall Precaution Comments: CIWA, coughing with thin liquids 10/16 Restrictions Weight Bearing Restrictions: No     Mobility  Bed  Mobility Overal bed mobility: Needs Assistance Bed Mobility: Rolling, Sidelying to Sit, Sit to Supine   Sidelying to sit: HOB elevated, Used rails, Mod assist, +2 for safety/equipment   Sit to supine: +2 for physical assistance, Used rails, HOB elevated, Mod assist   General bed mobility comments: Mod cues to initiate, slightly improved participation this date, but slow to perform and remains groggy. Fiance present and agreeable to assist if needed but mostly only needing to stand by for safety.    Transfers Overall transfer level: Needs assistance Equipment used: 2 person hand held assist Transfers: Sit to/from Stand Sit to Stand: Min assist, +2 physical assistance           General transfer comment: Pt impulsive to stand prior to cues given, +2 HHA provided for safety, no buckling but pt endorses fatigue after short time in standing. See BP below, borderline orthostatic today. +2 mod/maxA for x3 sidesteps toward [facility] prior to sitting, see below    Ambulation/Gait Ambulation/Gait assistance: +2 physical assistance, Mod assist   Assistive device: 2 person hand held assist Gait Pattern/deviations: Shuffle     Pre-gait activities: 2-3 lateral steps toward HOB with +2 modA via HHA, max cues, poor foot clearance General Gait Details: see below   Stairs             Wheelchair Mobility     Tilt Bed    Modified Rankin (Stroke Patients Only)       Balance Overall balance assessment: Needs assistance Sitting-balance support: Feet supported Sitting balance-Leahy Scale: Poor Sitting balance - Comments: minA to maintain upright unsupported   Standing balance support: Bilateral upper extremity supported Standing balance-Leahy Scale: Poor Standing balance comment: +2 HHA  Cognition Arousal: Obtunded Behavior During Therapy: Flat affect, Impulsive Overall Cognitive Status: Difficult to assess                                  General Comments: Difficult to understanding speech at times due to level of drowsiness. Pt groggy and pt/spouse educated on safety with oral intake given decreased alertness but pt thirsty, when taking sips of water he takes large gulps and often coughing. Fiance educated on limiting oral intake to only when he is alert and RN/MD notified in case he needs SLP consult to further assess. Slow processing throughout session, pt opens eyes to command but doesn't keep them open. Blinds opened further and fiance educated on methods to decrease delirium/encourage better sleep/wake cycle including TV off/blinds open.        Exercises Other Exercises Other Exercises: Encouraged supine BLE AROM: ankle pumps, heel slides, seated LAQ, will reinforce next session when pt more alert    General Comments General comments (skin integrity, edema, etc.): Borderline orthostatic hypotension, BP standing 95/62 (74) and BP in bed chiar posture 114/79 (91) HR 71 bpm resting. May want to further assess in future sessions if pt c/o fatigue/lightheadedness, he denies dizziness today but limited standing tolerance. BP not working correctly when attempted sitting EOB, tried x2 with no reading, cuff adjusted and worked correctly once in supine.     Pertinent Vitals/Pain Pain Assessment Pain Assessment: Faces Faces Pain Scale: No hurt Pain Intervention(s): Monitored during session, Repositioned     PT Goals (current goals can now be found in the care plan section) Acute Rehab PT Goals Patient Stated Goal: unable to state, per fiance for him to be able to go home PT Goal Formulation: With family Time For Goal Achievement: 08/31/23 Progress towards PT goals: Progressing toward goals (slowly)    Frequency    Min 1X/week      PT Plan         AM-PAC PT "6 Clicks" Mobility   Outcome Measure  Help needed turning from your back to your side while in a flat bed without using bedrails?: A Lot Help needed  moving from lying on your back to sitting on the side of a flat bed without using bedrails?: A Lot Help needed moving to and from a bed to a chair (including a wheelchair)?: Total Help needed standing up from a chair using your arms (e.g., wheelchair or bedside chair)?: A Lot Help needed to walk in hospital room?: Total Help needed climbing 3-5 steps with a railing? : Total 6 Click Score: 9    End of Session Equipment Utilized During Treatment: Gait belt Activity Tolerance: Patient limited by lethargy Patient left: in bed;with call bell/phone within reach;with bed alarm set;with family/visitor present Nurse Communication: Mobility status;Other (comment) (may benefit from SLP consult, coughing with thin liquids and fiance needed education to remove straw after small sips) PT Visit Diagnosis: Difficulty in walking, not elsewhere classified (R26.2)     Time: 8657-8469 PT Time Calculation (min) (ACUTE ONLY): 23 min  Charges:    $Therapeutic Activity: 23-37 mins PT General Charges $$ ACUTE PT VISIT: 1 Visit                     Danen Lapaglia P., PTA Acute Rehabilitation Services Secure Chat Preferred 9a-5:30pm Office: [phone]    Dorathy Kinsman [facility] 08/18/2023, 7:06 PM

## 2023-08-19 DIAGNOSIS — F10930 Alcohol use, unspecified with withdrawal, uncomplicated: Secondary | ICD-10-CM | POA: Diagnosis not present

## 2023-08-19 DIAGNOSIS — F10931 Alcohol use, unspecified with withdrawal delirium: Secondary | ICD-10-CM | POA: Diagnosis not present

## 2023-08-19 DIAGNOSIS — R569 Unspecified convulsions: Secondary | ICD-10-CM | POA: Diagnosis not present

## 2023-08-19 LAB — COMPREHENSIVE METABOLIC PANEL
ALT: 39 U/L (ref 0–44)
AST: 177 U/L — ABNORMAL HIGH (ref 15–41)
Albumin: 3 g/dL — ABNORMAL LOW (ref 3.5–5.0)
Alkaline Phosphatase: 55 U/L (ref 38–126)
Anion gap: 13 (ref 5–15)
BUN: 6 mg/dL — ABNORMAL LOW (ref 8–23)
CO2: 21 mmol/L — ABNORMAL LOW (ref 22–32)
Calcium: 9.6 mg/dL (ref 8.9–10.3)
Chloride: 99 mmol/L (ref 98–111)
Creatinine, Ser: 0.62 mg/dL (ref 0.61–1.24)
GFR, Estimated: >60 mL/min (ref >60)
Glucose, Bld: 98 mg/dL (ref 70–99)
Potassium: 3.7 mmol/L (ref 3.5–5.1)
Sodium: 133 mmol/L — ABNORMAL LOW (ref 135–145)
Total Bilirubin: 0.8 mg/dL (ref 0.3–1.2)
Total Protein: 6.9 g/dL (ref 6.5–8.1)

## 2023-08-19 LAB — CBC WITH DIFFERENTIAL/PLATELET
Abs Immature Granulocytes: 0.02 10*3/uL (ref 0.00–0.07)
Basophils Absolute: 0 10*3/uL (ref 0.0–0.1)
Basophils Relative: 1 %
Eosinophils Absolute: 0.1 10*3/uL (ref 0.0–0.5)
Eosinophils Relative: 3 %
HCT: 39 % (ref 39.0–52.0)
Hemoglobin: 13.6 g/dL (ref 13.0–17.0)
Immature Granulocytes: 1 %
Lymphocytes Relative: 26 %
Lymphs Abs: 0.8 10*3/uL (ref 0.7–4.0)
MCH: 32.6 pg (ref 26.0–34.0)
MCHC: 34.9 g/dL (ref 30.0–36.0)
MCV: 93.5 fL (ref 80.0–100.0)
Monocytes Absolute: 0.4 10*3/uL (ref 0.1–1.0)
Monocytes Relative: 15 %
Neutro Abs: 1.6 10*3/uL — ABNORMAL LOW (ref 1.7–7.7)
Neutrophils Relative %: 54 %
Platelets: 47 10*3/uL — ABNORMAL LOW (ref 150–400)
RBC: 4.17 MIL/uL — ABNORMAL LOW (ref 4.22–5.81)
RDW: 15 % (ref 11.5–15.5)
WBC: 2.9 10*3/uL — ABNORMAL LOW (ref 4.0–10.5)
nRBC: 0 % (ref 0.0–0.2)

## 2023-08-19 LAB — PROCALCITONIN: Procalcitonin: <0.1 ng/mL

## 2023-08-19 LAB — BRAIN NATRIURETIC PEPTIDE: B Natriuretic Peptide: 77.5 pg/mL (ref 0.0–100.0)

## 2023-08-19 LAB — C-REACTIVE PROTEIN: CRP: 3.4 mg/dL — ABNORMAL HIGH (ref <1.0)

## 2023-08-19 LAB — PHOSPHORUS: Phosphorus: 4 mg/dL (ref 2.5–4.6)

## 2023-08-19 LAB — MAGNESIUM: Magnesium: 2.1 mg/dL (ref 1.7–2.4)

## 2023-08-19 NOTE — Progress Notes (Signed)
PROGRESS NOTE                                                                                                                                                                                                             Patient Demographics:    William Baxter, is a 62 y.o. male, DOB - 10-08-1961, ZDG:[id number]  Outpatient Primary MD for the [facility] is [facility]    LOS - 4  Admit date - 08/15/2023    Chief Complaint  Patient presents with   seizure/withdrawal   Seizures       Brief Narrative (HPI from H&P)     62 y.o. male past medical history significant for CAD, alcohol abuse, BPH and migraine presents with alcoholic seizure withdrawals.  According to patient's fianc he is a heavy alcohol user and abruptly quit drinking 2 days prior to his seizure, was brought to the ER where head CT was unremarkable, he was found to be going into DTs and admitted to the hospital.   Subjective:    [name] today h reports he is feeling better, appetite has improved, fianc at bedside reports he has been improving.   Assessment  & Plan :    DTs with alcohol withdrawal seizure.  -  Head CT stable, placed on phenobarb taper along with CIWA protocol, DTs have improved, continue to monitor, EEG with no evidence of seizures  -He has been counseled to abstain from alcohol.  Acute metabolic encephalopathy -He is significantly somnolent, altered this morning, this is most likely secondary to withdrawals, as well possibly phenobarbital taper is contributing, -Continue with IV thiamine empirically -Yesterday he had come coughing with eating, but he did well with SLP today as mentation much improved  Mildly elevated LFTs.  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   Abstain from alcohol.    Chronic thrombocytopenia.  Due to above..  Mildly elevated CKs.  Due to rhabdo.  Hydrate, monitor CK levels, continue to monitor and CK still trending up  CAD s/p percutaneous angioplasty.  Hold statins due to rising CKs, not on antiplatelets due to underlying thrombocytopenia.  Supportive care.  Low-dose beta-blocker.  Hypertension.  Beta-blocker and as needed Catapres.  Fever 10/15 -Patient high risk for aspiration, so started empirically on Unasyn, his chest x-ray is reassuring, UA is negative, CBC and Pro-Cal is reassuring, will DC antibiotic .     Mild hyponatremia.  Gentle IV fluids      Condition -  Guarded  Family Communication  : Fianc at bedside  Code Status : Full code  Consults  :  None  PUD Prophylaxis :  PPI   Procedures  :     CT head.  Nonacute.    Right upper quadrant ultrasound. Hepatic steatosis without focal liver lesions      Disposition Plan  :    Status is: Inpatient   DVT Prophylaxis  :    Place and maintain sequential compression device Start: 08/19/23 0703 SCDs Start: 08/15/23 2044    Lab Results  Component Value Date   PLT 47 (L) 08/19/2023    Diet :  Diet Order             Diet Heart Room service appropriate? Yes; Fluid consistency: Thin  Diet effective now                    Inpatient Medications  Scheduled Meds:  feeding supplement  237 mL Oral BID BM   folic acid  1 mg Oral Daily   metoprolol tartrate  50 mg Oral BID   multivitamin with minerals  1 tablet Oral Daily   pantoprazole  40 mg Oral Daily   phenobarbital  32.4 mg Oral Q12H   tamsulosin  0.4 mg Oral Daily   thiamine  100 mg Oral Daily   Or   thiamine  100 mg Intravenous Daily   Continuous Infusions:  ampicillin-sulbactam (UNASYN) IV 3 g (08/19/23 0849)   PRN Meds:.acetaminophen **OR** acetaminophen, cloNIDine, ondansetron **OR** ondansetron (ZOFRAN) IV  Antibiotics  :    Anti-infectives (From admission, onward)     Start     Dose/Rate Route Frequency Ordered Stop   08/17/23 1130  Ampicillin-Sulbactam (UNASYN) 3 g in sodium chloride 0.9 % 100 mL IVPB        3 g 200 mL/hr over 30 Minutes Intravenous Every 6 hours 08/17/23 1043 08/19/23 2359         Objective:   Vitals:   08/19/23 0000 08/19/23 0400 08/19/23 0800 08/19/23 1200  BP: 98/76 99/70 (!) 128/90 106/69  Pulse: 70 65 62 63  Resp: 15 14 11 11   Temp: 98.9 F (37.2 C) 97.6 F (36.4 C)    TempSrc: Oral Oral    SpO2: 98% 97%  99%  Weight:      Height:        Wt Readings from Last 3 Encounters:  08/15/23 70.3 kg  04/03/22 77.1 kg  08/18/20 77 kg     Intake/Output Summary (Last 24 hours) at 08/19/2023 1558 Last data filed at 08/19/2023 0543 Gross per 24 hour  Intake --  Output 1000 ml  Net -1000 ml     Physical Exam  Awake Alert, Oriented X 3, No new F.N deficits, Normal affect Symmetrical Chest wall movement, Good air movement bilaterally, CTAB RRR,No Gallops,Rubs or new Murmurs, No Parasternal Heave +ve B.Sounds, Abd Soft, No tenderness, No rebound - guarding or rigidity. No Cyanosis, Clubbing or edema, No new Rash or bruise        Data Review:    Recent Labs  Lab 08/15/23 1800 08/16/23 1248 08/17/23 1025 08/18/23 0346 08/19/23 0331  WBC 4.6 3.2* 4.3 3.6* 2.9*  HGB  12.2* 13.3 13.8 14.1 13.6  HCT 35.6* 37.3* 39.3 39.8 39.0  PLT 38* 36* 39* 42* 47*  MCV 93.9 94.2 92.0 93.0 93.5  MCH 32.2 33.6 32.3 32.9 32.6  MCHC 34.3 35.7 35.1 35.4 34.9  RDW 14.6 14.2 14.5 14.8 15.0  LYMPHSABS 0.4*  --  0.9 0.9 0.8  MONOABS 0.3  --  0.5 0.3 0.4  EOSABS 0.0  --  0.1 0.1 0.1  BASOSABS 0.0  --  0.0 0.0 0.0    Recent Labs  Lab 08/15/23 1800 08/15/23 1800 08/15/23 1918 08/15/23 2215 08/16/23 1248 08/17/23 0818 08/17/23 1025 08/18/23 0346 08/19/23 0331  NA 132*  --   --  129* 131* 130* 128* 131* 133*  K 4.1  --   --  3.4* 3.4* 4.1 3.7 4.0 3.7  CL 97*  --   --  93* 95* 98 97* 100 99  CO2 23  --   --  23 22 20* 20*  18* 21*  ANIONGAP 12  --   --  13 14 12 11 13 13   GLUCOSE 140*  --   --  100* 93 90 99 91 98  BUN <5*  --   --  <5* <5* <5* 5* 6* 6*  CREATININE 0.80  --   --  0.78 0.72 0.79 0.73 0.71 0.62  AST 127*  --   --   --  110* 108*  --  146* 177*  ALT 51*  --   --   --  47* 42  --  39 39  ALKPHOS 67  --   --   --  59 64  --  60 55  BILITOT 1.0  --   --   --  1.3* 1.1  --  0.9 0.8  ALBUMIN 3.1*  --   --   --  3.6 3.7  --  3.4* 3.0*  CRP  --   --   --   --   --   --  2.4* 3.6* 3.4*  PROCALCITON  --   --   --   --   --   --  0.14 0.10 <0.10  INR 1.2  --   --   --   --   --   --   --   --   TSH  --   --   --  0.530  --   --   --   --   --   AMMONIA  --   --  39*  --   --   --   --   --   --   BNP  --   --   --   --   --   --   --  68.2 77.5  MG  --    < >  --  1.9 1.7 1.7 1.9 1.7 2.1  CALCIUM 8.9  --   --  8.9 9.1 9.9 9.5 9.2 9.6   < > = values in this interval not displayed.      Recent Labs  Lab 08/15/23 1800 08/15/23 1800 08/15/23 1918 08/15/23 2215 08/16/23 1248 08/17/23 0818 08/17/23 1025 08/18/23 0346 08/19/23 0331  CRP  --   --   --   --   --   --  2.4* 3.6* 3.4*  PROCALCITON  --   --   --   --   --   --  0.14 0.10 <0.10  INR 1.2  --   --   --   --   --   --   --   --  TSH  --   --   --  0.530  --   --   --   --   --   AMMONIA  --   --  39*  --   --   --   --   --   --   BNP  --   --   --   --   --   --   --  68.2 77.5  MG  --    < >  --  1.9 1.7 1.7 1.9 1.7 2.1  CALCIUM 8.9  --   --  8.9 9.1 9.9 9.5 9.2 9.6   < > = values in this interval not displayed.    --------------------------------------------------------------------------------------------------------------- Lab Results  Component Value Date   CHOL 176 12/16/2013   HDL 77 12/16/2013   LDLCALC 90 12/16/2013   TRIG 43 12/16/2013   CHOLHDL 2.3 12/16/2013    No results found for: "HGBA1C" No results for input(s): "TSH", "T4TOTAL", "FREET4", "T3FREE", "THYROIDAB" in the last 72 hours.  No results for  input(s): "VITAMINB12", "FOLATE", "FERRITIN", "TIBC", "IRON", "RETICCTPCT" in the last 72 hours.     Radiology Reports EEG adult  Result Date: 08/17/2023 Charlsie Quest, MD     08/17/2023  2:34 PM Patient Name: JABEZ MOLNER MRN: [id number] Epilepsy Attending: Charlsie Quest Referring Physician/Provider: Leroy Sea, MD Date: 08/17/2023 Duration: 25.57 mins Patient history: 62 year old male with history of alcohol use presented with alcohol withdrawal seizure.  EEG to evaluate for seizure. Level of alertness: Awake, asleep AEDs during EEG study: Phenobarbital Technical aspects: This EEG study was done with scalp electrodes positioned according to the 10-20 International system of electrode placement. Electrical activity was reviewed with band pass filter of 1-70Hz , sensitivity of 7 uV/mm, display speed of 85mm/sec with a 60Hz  notched filter applied as appropriate. EEG data were recorded continuously and digitally stored.  Video monitoring was available and reviewed as appropriate. Description: No clear posterior dominant rhythm consists of 9-10 Hz activity of moderate voltage (25-35 uV) seen predominantly in posterior head regions, symmetric and reactive to eye opening and eye closing. Sleep was characterized by vertex waves, sleep spindles (12 to 14 Hz), maximal frontocentral region. There is an excessive amount of 15 to 18 Hz, beta activity distributed symmetrically and diffusely. Hyperventilation and photic stimulation were not performed.   ABNORMALITY - Excessive beta, generalized IMPRESSION: This study is within normal limits. The excessive beta activity seen in the background is most likely due to the effect of benzodiazepine and is a benign EEG pattern. No seizures or epileptiform discharges were seen throughout the recording. A normal interictal EEG does not exclude the diagnosis of epilepsy. Charlsie Quest   DG Chest Port 1 View  Result Date: 08/17/2023 CLINICAL DATA:  Shortness  of breath.  Seizures. EXAM: PORTABLE CHEST 1 VIEW COMPARISON:  08/15/2023 FINDINGS: Artifact overlies the chest. Heart size is normal. Lungs are clear. The vascularity is normal. No effusions. No abnormal bone finding. IMPRESSION: No active disease. Electronically Signed   By: Paulina Fusi M.D.   On: 08/17/2023 12:55   CT HEAD WO CONTRAST ( )  Result Date: 08/15/2023 CLINICAL DATA:  Seizure EXAM: CT HEAD WITHOUT CONTRAST TECHNIQUE: Contiguous axial images were obtained from the base of the skull through the vertex without intravenous contrast. RADIATION DOSE REDUCTION: This exam was performed according to the departmental dose-optimization program which includes automated exposure control, adjustment of the mA and/or kV according to patient size  and/or use of iterative reconstruction technique. COMPARISON:  12/30/2016 FINDINGS: Brain: There is no mass, hemorrhage or extra-axial collection. The appearance of the white matter is normal for the patient's age. There is generalized atrophy. Vascular: Atherosclerotic calcification of the internal carotid arteries at the skull base. No abnormal hyperdensity of the major intracranial arteries or dural venous sinuses. Skull: The visualized skull base, calvarium and extracranial soft tissues are normal. Sinuses/Orbits: Small amount of right mastoid fluid. Minimal left sphenoid sinus mucosal thickening. Normal orbits. IMPRESSION: 1. No acute intracranial abnormality. 2. Generalized atrophy. 3. Small amount of right mastoid fluid. Electronically Signed   By: Deatra Robinson M.D.   On: 08/15/2023 21:22   US Abdomen Limited RUQ (LIVER/GB)  Result Date: 08/15/2023 CLINICAL DATA:  Elevated liver function tests. EXAM: ULTRASOUND ABDOMEN LIMITED RIGHT UPPER QUADRANT COMPARISON:  None Available. FINDINGS: Gallbladder: No gallstones or wall thickening visualized (1.2 mm). No sonographic Murphy sign noted by sonographer. Common bile duct: Diameter: 4.3 mm Liver: No focal  lesion identified. Diffusely increased echogenicity of the liver parenchyma is noted. Portal vein is patent on color Doppler imaging with normal direction of blood flow towards the liver. Other: None. IMPRESSION: Hepatic steatosis without focal liver lesions. Electronically Signed   By: Aram Candela M.D.   On: 08/15/2023 20:40   DG CHEST PORT 1 VIEW  Result Date: 08/15/2023 CLINICAL DATA:  Dyspnea EXAM: PORTABLE CHEST 1 VIEW COMPARISON:  02/06/2020 FINDINGS: Normal cardiac size. No focal opacity or pleural effusion. No pneumothorax. IMPRESSION: No active disease. Electronically Signed   By: Jasmine Pang M.D.   On: 08/15/2023 20:28      Signature  -   Huey Bienenstock M.D on 08/19/2023 at 3:58 PM   -  To page go to [email or web address]

## 2023-08-19 NOTE — Progress Notes (Signed)
Physical Therapy Treatment Patient Details Name: William Baxter MRN: [id number] DOB: 12/25/1960 Today's Date: 08/19/2023   History of Present Illness 62 y.o. male presents 08/16/23 with alcoholic seizure withdrawals. Head CT unremarkable; going into DTs;   PMH significant for CAD, alcohol abuse, BPH and migraine    PT Comments  Pt received in supine, drowsy but awoken with increased time, pt with improving alertness this date but still with some slowed processing and notable STM and balance deficits. Pt unable to recall previous PT session yesterday evening. Pt needing up to minA for stability due to posterior LOB when unsupported standing at bedside and steadier using RW, but still needing minA via gait belt for household distance gait trial due to instability/narrow BOS and gait deviations. Pt continues to benefit from PT services to progress toward functional mobility goals, pt may need DME recommendation for home and post-acute services, will continue to assess next session.   If plan is discharge home, recommend the following: A little help with bathing/dressing/bathroom;Assistance with cooking/housework;Direct supervision/assist for medications management;Direct supervision/assist for financial management;Assist for transportation;Help with stairs or ramp for entrance;Supervision due to cognitive status;A little help with walking and/or transfers   Can travel by private vehicle        Equipment Recommendations  None recommended by PT;Other (comment) (maybe RW? pending progress 10/18)    Recommendations for Other Services       Precautions / Restrictions Precautions Precautions: Fall Precaution Comments: CIWA Restrictions Weight Bearing Restrictions: No     Mobility  Bed Mobility Overal bed mobility: Needs Assistance Bed Mobility: Supine to Sit     Supine to sit: Contact guard     General bed mobility comments: Increased time to initiate, but once moving pt does well  with bed rails, HOB elevated. Plan to practice to/from flat bed next session.    Transfers Overall transfer level: Needs assistance Equipment used: Rolling walker (2 wheels) Transfers: Sit to/from Stand, Bed to chair/wheelchair/BSC Sit to Stand: Min assist   Step pivot transfers: Min assist       General transfer comment: Pt standing without BUE support but posterior LOB upon standing needing min to modA to correct and RW for safety/stability. Pt stands from EOB and recliner heights with minA to rise and for stability.    Ambulation/Gait Ambulation/Gait assistance: Min assist, +2 safety/equipment Gait Distance (Feet): 150 Feet Assistive device: Rolling walker (2 wheels) Gait Pattern/deviations: Narrow base of support, Decreased stride length, Step-through pattern, Scissoring       General Gait Details: x1 episode of scissoring, but mostly just with narrow BOS, pt improves to wider BOS with cues but does not maintain; Moderate BUE reliance on RW support, some posterior bias and lateral instability. Min cues for attention to task and activity pacing. Pt steadier using RW than without.   Stairs Stairs:  (defer, pt not yet alert/steady enough.)           Wheelchair Mobility     Tilt Bed    Modified Rankin (Stroke Patients Only)       Balance Overall balance assessment: Needs assistance Sitting-balance support: Feet supported, No upper extremity supported Sitting balance-Leahy Scale: Fair Sitting balance - Comments: Supervision at EOB today   Standing balance support: Bilateral upper extremity supported Standing balance-Leahy Scale: Poor Standing balance comment: reliant on RW for dynamic tasks vs +2 HHA  Cognition Arousal: Lethargic Behavior During Therapy: Flat affect (slightly increased affect from previous date) Overall Cognitive Status: Impaired/Different from baseline Area of Impairment: Safety/judgement, Problem  solving, Memory                     Memory: Decreased short-term memory   Safety/Judgement: Decreased awareness of safety, Decreased awareness of deficits   Problem Solving: Slow processing, Difficulty sequencing, Requires verbal cues General Comments: Pt sleeping upon PTA arrival to room, but awakens with increased time. Slow processing but pt responding to commands better this date and able to maintain eyes open whole session. Decreased awareness of safety and pt unable to recall session with PTA on previous date.        Exercises      General Comments        Pertinent Vitals/Pain Pain Assessment Pain Assessment: No/denies pain Faces Pain Scale: No hurt Pain Intervention(s): Monitored during session, Repositioned    Home Living                          Prior Function            PT Goals (current goals can now be found in the care plan section) Acute Rehab PT Goals Patient Stated Goal: To go home as soon as possible. PT Goal Formulation: With family Time For Goal Achievement: 08/31/23 Progress towards PT goals: Progressing toward goals    Frequency    Min 1X/week      PT Plan      Co-evaluation              AM-PAC PT "6 Clicks" Mobility   Outcome Measure  Help needed turning from your back to your side while in a flat bed without using bedrails?: A Little Help needed moving from lying on your back to sitting on the side of a flat bed without using bedrails?: A Little Help needed moving to and from a bed to a chair (including a wheelchair)?: A Little Help needed standing up from a chair using your arms (e.g., wheelchair or bedside chair)?: A Little Help needed to walk in hospital room?: A Lot Help needed climbing 3-5 steps with a railing? : Total 6 Click Score: 15    End of Session Equipment Utilized During Treatment: Gait belt Activity Tolerance: Patient tolerated treatment well Patient left: with call bell/phone within  reach;with family/visitor present;in chair;with chair alarm set (Fiance in the room with him) Nurse Communication: Mobility status PT Visit Diagnosis: Difficulty in walking, not elsewhere classified (R26.2)     Time: 1730-1753 PT Time Calculation (min) (ACUTE ONLY): 23 min  Charges:    $Gait Training: 8-22 mins $Therapeutic Activity: 8-22 mins PT General Charges $$ ACUTE PT VISIT: 1 Visit                     Elidia Bonenfant P., PTA Acute Rehabilitation Services Secure Chat Preferred 9a-5:30pm Office: [phone]    Dorathy Kinsman [facility] 08/19/2023, 6:24 PM

## 2023-08-19 NOTE — Plan of Care (Signed)
  Problem: Education: Goal: Knowledge of General Education information will improve Description: Including pain rating scale, medication(s)/side effects and non-pharmacologic comfort measures Outcome: Progressing   Problem: Health Behavior/Discharge Planning: Goal: Ability to manage health-related needs will improve Outcome: Progressing   Problem: Clinical Measurements: Goal: Ability to maintain clinical measurements within normal limits will improve Outcome: Progressing Goal: Will remain free from infection Outcome: Progressing   Problem: Education: Goal: Knowledge of General Education information will improve Description: Including pain rating scale, medication(s)/side effects and non-pharmacologic comfort measures Outcome: Progressing   Problem: Health Behavior/Discharge Planning: Goal: Ability to manage health-related needs will improve Outcome: Progressing   Problem: Clinical Measurements: Goal: Ability to maintain clinical measurements within normal limits will improve Outcome: Progressing Goal: Will remain free from infection Outcome: Progressing

## 2023-08-19 NOTE — Evaluation (Signed)
Clinical/Bedside Swallow Evaluation Patient Details  Name: William Baxter MRN: [id number] Date of Birth: December 20, 1960  Today's Date: 08/19/2023 Time: SLP Start Time (ACUTE ONLY): 0822 SLP Stop Time (ACUTE ONLY): 0831 SLP Time Calculation (min) (ACUTE ONLY): 9 min  Past Medical History:  Past Medical History:  Diagnosis Date   BPH (benign prostatic hyperplasia)    Heart disease    Medical history non-contributory    Migraine    Myocardial infarction acute [facility])    Past Surgical History:  Past Surgical History:  Procedure Laterality Date   CARDIAC CATHETERIZATION     HPI:  William Baxter is a 62 y.o. male presented with alcoholic seizure withdrawals. Head CT 10/13 with no acute findings.  CXR 10/13 and 15 with no active disease.  Pt with past medical history significant for CAD, alcohol abuse, BPH and migraine.    Assessment / Plan / Recommendation  Clinical Impression  Pt presents with functional swallowing as assessed clinically.  Pt tolerated all consistencies trialed, including serial straw sips of thin liquid, with no clinical s/s of aspiration, and exhbited good oral clearance of solids. Recommend continuing current diet.  Pt has no further ST needs at this time. SLP will sign off.    Pt appears somewhat confused, but has been improving significantly since admission per fiancee at bedside.  If mentation does not return to baseline, consider cognitive linguistic assessment.    Recommend regular texture diet with thin liquids.   SLP Visit Diagnosis: Dysphagia, unspecified (R13.10)    Aspiration Risk  No limitations    Diet Recommendation Regular;Thin liquid    Liquid Administration via: Cup;Straw Medication Administration: Whole meds with liquid Supervision: Staff to assist with self feeding Compensations: Slow rate;Small sips/bites Postural Changes: Seated upright at 90 degrees    Other  Recommendations Oral Care Recommendations: Oral care BID    Recommendations  for follow up therapy are one component of a multi-disciplinary discharge planning process, led by the attending physician.  Recommendations may be updated based on patient status, additional functional criteria and insurance authorization.  Follow up Recommendations No SLP follow up      Assistance Recommended at Discharge  N/A  Functional Status Assessment Patient has not had a recent decline in their functional status  Frequency and Duration  (N/A)          Prognosis Prognosis for improved oropharyngeal function:  (N/A)      Swallow Study   General Date of Onset: 08/15/23 HPI: William Baxter is a 62 y.o. male presented with alcoholic seizure withdrawals. Head CT 10/13 with no acute findings.  CXR 10/13 and 15 with no active disease.  Pt with past medical history significant for CAD, alcohol abuse, BPH and migraine. Type of Study: Bedside Swallow Evaluation Previous Swallow Assessment: None Diet Prior to this Study: Regular;Thin liquids (Level 0) Temperature Spikes Noted: No Respiratory Status: Room air History of Recent Intubation: No Behavior/Cognition: Alert;Cooperative;Pleasant mood Oral Cavity Assessment: Within Functional Limits Oral Care Completed by SLP: No Oral Cavity - Dentition: Adequate natural dentition Self-Feeding Abilities: Needs assist Patient Positioning: Upright in bed;Partially reclined Baseline Vocal Quality: Normal Volitional Cough:  (Politely declined) Volitional Swallow: Able to elicit    Oral/Motor/Sensory Function Overall Oral Motor/Sensory Function: Within functional limits Facial ROM: Within Functional Limits Facial Symmetry: Within Functional Limits Lingual ROM: Within Functional Limits Lingual Symmetry: Within Functional Limits Lingual Strength: Within Functional Limits Velum: Within Functional Limits Mandible: Within Functional Limits   Ice Chips Ice chips: Not tested  Thin Liquid Thin Liquid: Within functional limits Presentation:  Straw    Nectar Thick Nectar Thick Liquid: Not tested   Honey Thick Honey Thick Liquid: Not tested   Puree Puree: Within functional limits Presentation: Spoon   Solid     Solid: Within functional limits Presentation:  (SLP fed)      Kerrie Pleasure, MA, CCC-SLP Acute Rehabilitation Services Office: [phone] 08/19/2023,8:40 AM

## 2023-08-20 ENCOUNTER — Other Ambulatory Visit (HOSPITAL_COMMUNITY): Payer: Self-pay

## 2023-08-20 DIAGNOSIS — F10930 Alcohol use, unspecified with withdrawal, uncomplicated: Secondary | ICD-10-CM | POA: Diagnosis not present

## 2023-08-20 DIAGNOSIS — F10931 Alcohol use, unspecified with withdrawal delirium: Secondary | ICD-10-CM | POA: Diagnosis not present

## 2023-08-20 DIAGNOSIS — I2510 Atherosclerotic heart disease of native coronary artery without angina pectoris: Secondary | ICD-10-CM | POA: Diagnosis not present

## 2023-08-20 DIAGNOSIS — R7989 Other specified abnormal findings of blood chemistry: Secondary | ICD-10-CM | POA: Diagnosis not present

## 2023-08-20 LAB — COMPREHENSIVE METABOLIC PANEL
ALT: 37 U/L (ref 0–44)
AST: 155 U/L — ABNORMAL HIGH (ref 15–41)
Albumin: 2.8 g/dL — ABNORMAL LOW (ref 3.5–5.0)
Alkaline Phosphatase: 61 U/L (ref 38–126)
Anion gap: 9 (ref 5–15)
BUN: 5 mg/dL — ABNORMAL LOW (ref 8–23)
CO2: 24 mmol/L (ref 22–32)
Calcium: 9.4 mg/dL (ref 8.9–10.3)
Chloride: 96 mmol/L — ABNORMAL LOW (ref 98–111)
Creatinine, Ser: 0.67 mg/dL (ref 0.61–1.24)
GFR, Estimated: >60 mL/min (ref >60)
Glucose, Bld: 100 mg/dL — ABNORMAL HIGH (ref 70–99)
Potassium: 3.5 mmol/L (ref 3.5–5.1)
Sodium: 129 mmol/L — ABNORMAL LOW (ref 135–145)
Total Bilirubin: 0.9 mg/dL (ref 0.3–1.2)
Total Protein: 6.7 g/dL (ref 6.5–8.1)

## 2023-08-20 LAB — CBC WITH DIFFERENTIAL/PLATELET
Abs Immature Granulocytes: 0.02 10*3/uL (ref 0.00–0.07)
Basophils Absolute: 0 10*3/uL (ref 0.0–0.1)
Basophils Relative: 1 %
Eosinophils Absolute: 0.1 10*3/uL (ref 0.0–0.5)
Eosinophils Relative: 4 %
HCT: 38.5 % — ABNORMAL LOW (ref 39.0–52.0)
Hemoglobin: 13.3 g/dL (ref 13.0–17.0)
Immature Granulocytes: 1 %
Lymphocytes Relative: 33 %
Lymphs Abs: 1 10*3/uL (ref 0.7–4.0)
MCH: 32.9 pg (ref 26.0–34.0)
MCHC: 34.5 g/dL (ref 30.0–36.0)
MCV: 95.3 fL (ref 80.0–100.0)
Monocytes Absolute: 0.5 10*3/uL (ref 0.1–1.0)
Monocytes Relative: 16 %
Neutro Abs: 1.4 10*3/uL — ABNORMAL LOW (ref 1.7–7.7)
Neutrophils Relative %: 45 %
Platelets: 51 10*3/uL — ABNORMAL LOW (ref 150–400)
RBC: 4.04 MIL/uL — ABNORMAL LOW (ref 4.22–5.81)
RDW: 14.6 % (ref 11.5–15.5)
WBC: 3 10*3/uL — ABNORMAL LOW (ref 4.0–10.5)
nRBC: 0 % (ref 0.0–0.2)

## 2023-08-20 LAB — C-REACTIVE PROTEIN: CRP: 2.4 mg/dL — ABNORMAL HIGH (ref <1.0)

## 2023-08-20 LAB — PROCALCITONIN: Procalcitonin: <0.1 ng/mL

## 2023-08-20 LAB — MAGNESIUM: Magnesium: 1.6 mg/dL — ABNORMAL LOW (ref 1.7–2.4)

## 2023-08-20 LAB — BRAIN NATRIURETIC PEPTIDE: B Natriuretic Peptide: 115.5 pg/mL — ABNORMAL HIGH (ref 0.0–100.0)

## 2023-08-20 LAB — PHOSPHORUS: Phosphorus: 3.3 mg/dL (ref 2.5–4.6)

## 2023-08-20 MED ORDER — PANTOPRAZOLE SODIUM 40 MG PO TBEC
40.0000 mg | DELAYED_RELEASE_TABLET | Freq: Every day | ORAL | 0 refills | Status: AC
  Filled 2023-08-20: qty 30, 30d supply, fill #0

## 2023-08-20 MED ORDER — FOLIC ACID 1 MG PO TABS
1.0000 mg | ORAL_TABLET | Freq: Every day | ORAL | 0 refills | Status: AC
  Filled 2023-08-20: qty 30, 30d supply, fill #0

## 2023-08-20 MED ORDER — MAGNESIUM OXIDE -MG SUPPLEMENT 400 (240 MG) MG PO TABS
400.0000 mg | ORAL_TABLET | Freq: Every day | ORAL | Status: DC
  Administered 2023-08-20: 400 mg via ORAL
  Filled 2023-08-20: qty 1

## 2023-08-20 MED ORDER — THIAMINE HCL 100 MG PO TABS
100.0000 mg | ORAL_TABLET | Freq: Every day | ORAL | 0 refills | Status: AC
  Filled 2023-08-20: qty 30, 30d supply, fill #0

## 2023-08-20 MED ORDER — ENSURE ENLIVE PO LIQD: 237.0000 mL | Freq: Two times a day (BID) | ORAL | Status: AC

## 2023-08-20 MED ORDER — MAGNESIUM OXIDE -MG SUPPLEMENT 400 (240 MG) MG PO TABS
400.0000 mg | ORAL_TABLET | Freq: Every day | ORAL | 0 refills | Status: AC
  Filled 2023-08-20: qty 5, 5d supply, fill #0

## 2023-08-20 MED ORDER — MAGNESIUM SULFATE 4 GM/100ML IV SOLN
4.0000 g | Freq: Once | INTRAVENOUS | Status: DC
  Filled 2023-08-20: qty 100

## 2023-08-20 MED ORDER — ADULT MULTIVITAMIN W/MINERALS CH
1.0000 | ORAL_TABLET | Freq: Every day | ORAL | 0 refills | Status: AC
  Filled 2023-08-20: qty 30, 30d supply, fill #0

## 2023-08-20 NOTE — TOC Transition Note (Addendum)
Transition of Care [facility]) - CM/SW Discharge Note   Patient Details  Name: William Baxter MRN: [id number] Date of Birth: [birth year]  Transition of Care [facility]) CM/SW Contact:  Gordy Clement, RN Phone Number: 08/20/2023, 11:04 AM   Clinical Narrative:     Patient will DC today to the home of [name] [phone]). Her address is [address] [place], [place]  [address].  Patient requesting RW be delivered there. RNCM will reach out to [place] to order RW  No additional TOC needs            Patient Goals and CMS Choice      Discharge Placement                         Discharge Plan and Services Additional resources added to the After Visit Summary for                                       Social Determinants of Health (SDOH) Interventions SDOH Screenings   Food Insecurity: No Food Insecurity (08/16/2023)  Housing: Low Risk  (08/16/2023)  Transportation Needs: No Transportation Needs (08/16/2023)  Utilities: Not At Risk (08/16/2023)  Tobacco Use: High Risk (08/15/2023)     Readmission Risk Interventions     No data to display

## 2023-08-20 NOTE — Plan of Care (Signed)
Problem: Education: Goal: Knowledge of General Education information will improve Description: Including pain rating scale, medication(s)/side effects and non-pharmacologic comfort measures Outcome: Progressing   Problem: Health Behavior/Discharge Planning: Goal: Ability to manage health-related needs will improve Outcome: Progressing   Problem: Clinical Measurements: Goal: Diagnostic test results will improve Outcome: Progressing   Problem: Activity: Goal: Risk for activity intolerance will decrease Outcome: Progressing   Problem: Nutrition: Goal: Adequate nutrition will be maintained Outcome: Progressing   Problem: Coping: Goal: Level of anxiety will decrease Outcome: Progressing   Problem: Pain Managment: Goal: General experience of comfort will improve Outcome: Progressing

## 2023-08-20 NOTE — Discharge Instructions (Signed)
Follow with Primary MD [facility] in 7 days   Get CBC, CMP,checked  by Primary MD next visit.    Activity: As tolerated with Full fall precautions use walker/cane & assistance as needed   Disposition Home    Diet: regular diet   On your next visit with your primary care physician please Get Medicines reviewed and adjusted.   Please request your Prim.MD to go over all Hospital Tests and Procedure/Radiological results at the follow up, please get all Hospital records sent to your Prim MD by signing hospital release before you go home.   If you experience worsening of your admission symptoms, develop shortness of breath, life threatening emergency, suicidal or homicidal thoughts you must seek medical attention immediately by calling 911 or calling your MD immediately  if symptoms less severe.  You Must read complete instructions/literature along with all the possible adverse reactions/side effects for all the Medicines you take and that have been prescribed to you. Take any new Medicines after you have completely understood and accpet all the possible adverse reactions/side effects.   Do not drive, operating heavy machinery, perform activities at heights, swimming or participation in water activities or provide baby sitting services if your were admitted for syncope or siezures until you have seen by Primary MD or a Neurologist and advised to do so again.  Do not drive when taking Pain medications.    Do not take more than prescribed Pain, Sleep and Anxiety Medications  Special Instructions: If you have smoked or chewed Tobacco  in the last 2 yrs please stop smoking, stop any regular Alcohol  and or any Recreational drug use.  Wear Seat belts while driving.   Please note  You were cared for by a hospitalist during your hospital stay. If you have any questions about your discharge medications or the care you received while you were in the hospital after you are  discharged, you can call the unit and asked to speak with the hospitalist on call if the hospitalist that took care of you is not available. Once you are discharged, your primary care physician will handle any further medical issues. Please note that NO REFILLS for any discharge medications will be authorized once you are discharged, as it is imperative that you return to your primary care physician (or establish a relationship with a primary care physician if you do not have one) for your aftercare needs so that they can reassess your need for medications and monitor your lab values.

## 2023-08-20 NOTE — Discharge Summary (Addendum)
Physician Discharge Summary  William Baxter ZOX:[id number] DOB: 1961/03/17 DOA: 08/15/2023  [facility]P: [facility]  Admit date: 08/15/2023 Discharge date: 08/20/2023  Admitted From: (Home) Disposition:  (Home )  Recommendations for Outpatient Follow-up:  Follow up with PCP in 1-2 weeks Please obtain BMP/CBC in one week  Diet recommendation: Heart Healthy  Brief/Interim Summary:  62 y.o. male past medical history significant for CAD, alcohol abuse, BPH and migraine presents with alcoholic seizure withdrawals. According to patient's fianc he is a heavy alcohol user and abruptly quit drinking 2 days prior to his seizure, was brought to the ER where head CT was unremarkable, he was found to be going into DTs and admitted to the hospital.  Patient with significant alcohol withdrawals during hospital stay, he was kept on phenobarbital taper, no further withdrawals at time of discharge.   Alcohol abuse,  Alcohol withdrawal DTs  Alcohol withdrawal seizure.  -  Head CT stable. - placed on phenobarb taper along with CIWA protocol, he has gradually improved, mentation back to baseline, ambulatory, appetite has improved, he will be discharged on thiamine, folic acid, he was counseled at length to abstain from alcohol . -Follow withdrawal seizures, no indication for AED, EEG with no evidence of seizures  -He has been counseled to abstain from alcohol.   Acute metabolic encephalopathy - significantly somnolent, altered mentation of alcohol withdrawals, and CIWA protocol-phenobarbital taper, this has resolved, mentation back to baseline  -He was treated with high-dose IV thiamine empirically, he will be discharged on p.o. thiamine.  Mildly elevated LFTs.   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  Abstain from alcohol.      Chronic thrombocytopenia.  Due to above..   Mildly elevated CKs.  Rhabdomyolysis  -Possibly related to seizures, he was kept on IV hydration, renal function stable.     CAD -Patient denies any stent placement in the past, but report angiogram was positive not amendable to stent few years ago, report he was on aspirin, Plavix, I will DC Plavix at time of discharge given thrombocytopenia, continue with aspirin, he was instructed [facility] to [facility] labs and if platelet count has been improving then to resume Plavix.   Hypertension.  Continue home medications   Fever 10/15 -Patient high risk for aspiration, so started empirically on Unasyn, his chest x-ray is reassuring, UA is negative, CBC and Pro-Cal is reassuring, he was monitored off antibiotics.  Hyponatremia -Mild, asymptomatic, most likely due to alcohol abuse    Discharge Diagnoses:  Principal Problem:   Alcohol withdrawal (HCC) Active Problems:   Alcohol withdrawal seizure (HCC)   Thrombocytopenia (HCC)   CAD S/P percutaneous coronary angioplasty   Elevated LFTs   Hyponatremia    Discharge Instructions  Discharge Instructions     Diet - low sodium heart healthy   Complete by: As directed    Discharge instructions   Complete by: As directed    [facility] in 7 days   Get CBC, CMP,checked  by Primary MD next visit.    Activity: As tolerated with Full fall precautions use walker/cane & assistance as needed   Disposition Home    Diet: Heart healthy diet   On your next visit with your primary care physician please Get Medicines reviewed and adjusted.   Please  request your Prim.MD to go over all Hospital Tests and Procedure/Radiological results at the follow up, please get all Hospital records sent to your Prim MD by signing hospital release before you go home.   If you experience worsening of your admission symptoms, develop shortness of breath,  life threatening emergency, suicidal or homicidal thoughts you must seek medical attention immediately by calling 911 or calling your MD immediately  if symptoms less severe.  You Must read complete instructions/literature along with all the possible adverse reactions/side effects for all the Medicines you take and that have been prescribed to you. Take any new Medicines after you have completely understood and accpet all the possible adverse reactions/side effects.   Do not drive, operating heavy machinery, perform activities at heights, swimming or participation in water activities or provide baby sitting services if your were admitted for syncope or siezures until you have seen by Primary MD or a Neurologist and advised to do so again.  Do not drive when taking Pain medications.    Do not take more than prescribed Pain, Sleep and Anxiety Medications  Special Instructions: If you have smoked or chewed Tobacco  in the last 2 yrs please stop smoking, stop any regular Alcohol  and or any Recreational drug use.  Wear Seat belts while driving.   Please note  You were cared for by a hospitalist during your hospital stay. If you have any questions about your discharge medications or the care you received while you were in the hospital after you are discharged, you can call the unit and asked to speak with the hospitalist on call if the hospitalist that took care of you is not available. Once you are discharged, your primary care physician will handle any further medical issues. Please note that NO REFILLS for any discharge medications will be authorized once you are discharged, as it is imperative that you return to your primary care physician (or establish a relationship with a primary care physician if you do not have one) for your aftercare needs so that they can reassess your need for medications and monitor your lab values.   Increase activity slowly   Complete by: As directed       Allergies  as of 08/20/2023       Reactions   Penicillins Nausea And Vomiting        Medication List     STOP taking these medications    aspirin EC 81 MG tablet   clopidogrel 75 MG tablet Commonly known as: PLAVIX   etodolac 300 MG capsule Commonly known as: LODINE   permethrin 5 % cream Commonly known as: ELIMITE       TAKE these medications    acetaminophen 325 MG tablet Commonly known as: TYLENOL Take 325 mg by mouth every 6 (six) hours as needed for mild pain.   albuterol 108 (90 Base) MCG/ACT inhaler Commonly known as: VENTOLIN HFA Inhale 2 puffs into the lungs every 6 (six) hours as needed for wheezing or shortness of breath.   atorvastatin 80 MG tablet Commonly known as: LIPITOR Take 1 tablet (80 mg total) by mouth daily at 6 PM.   feeding supplement Liqd Take 237 mLs by mouth 2 (two) times daily between meals.   folic acid 1 MG tablet Commonly known as: FOLVITE Take 1 tablet (1 mg total) by mouth daily. Start taking on: August 21, 2023   isosorbide mononitrate 60 MG 24 hr tablet Commonly known as: IMDUR Take 60 mg by  mouth daily.   magnesium oxide 400 (240 Mg) MG tablet Commonly known as: MAG-OX Take 1 tablet (400 mg total) by mouth daily.   metoprolol tartrate 25 MG tablet Commonly known as: LOPRESSOR Take 1 tablet (25 mg total) by mouth 2 (two) times daily.   multivitamin with minerals Tabs tablet Take 1 tablet by mouth daily. Start taking on: August 21, 2023   nitroGLYCERIN 0.4 MG SL tablet Commonly known as: NITROSTAT Place 1 tablet (0.4 mg total) under the tongue every 5 (five) minutes as needed for chest pain.   pantoprazole 40 MG tablet Commonly known as: PROTONIX Take 1 tablet (40 mg total) by mouth daily. Start taking on: August 21, 2023   polyvinyl alcohol 1.4 % ophthalmic solution Commonly known as: LIQUIFILM TEARS Place 1 drop into both eyes as needed for dry eyes.   tamsulosin 0.4 MG Caps capsule Commonly known as:  FLOMAX Take 0.4 mg by mouth daily.   thiamine 100 MG tablet Commonly known as: VITAMIN B1 Take 1 tablet (100 mg total) by mouth daily. Start taking on: August 21, 2023        Allergies  Allergen Reactions   Penicillins Nausea And Vomiting    Consultations: none   Procedures/Studies: EEG adult  Result Date: 02-Sep-2023 Charlsie Quest, MD     09-02-23  2:34 PM Patient Name: William Baxter MRN: [id number] Epilepsy Attending: Charlsie Quest Referring Physician/Provider: Leroy Sea, MD Date: 09-02-23 Duration: 25.57 mins Patient history: 62 year old male with history of alcohol use presented with alcohol withdrawal seizure.  EEG to evaluate for seizure. Level of alertness: Awake, asleep AEDs during EEG study: Phenobarbital Technical aspects: This EEG study was done with scalp electrodes positioned according to the 10-20 International system of electrode placement. Electrical activity was reviewed with band pass filter of 1-70Hz , sensitivity of 7 uV/mm, display speed of 43mm/sec with a 60Hz  notched filter applied as appropriate. EEG data were recorded continuously and digitally stored.  Video monitoring was available and reviewed as appropriate. Description: No clear posterior dominant rhythm consists of 9-10 Hz activity of moderate voltage (25-35 uV) seen predominantly in posterior head regions, symmetric and reactive to eye opening and eye closing. Sleep was characterized by vertex waves, sleep spindles (12 to 14 Hz), maximal frontocentral region. There is an excessive amount of 15 to 18 Hz, beta activity distributed symmetrically and diffusely. Hyperventilation and photic stimulation were not performed.   ABNORMALITY - Excessive beta, generalized IMPRESSION: This study is within normal limits. The excessive beta activity seen in the background is most likely due to the effect of benzodiazepine and is a benign EEG pattern. No seizures or epileptiform discharges were seen  throughout the recording. A normal interictal EEG does not exclude the diagnosis of epilepsy. Charlsie Quest   DG Chest Port 1 View  Result Date: 2023-09-02 CLINICAL DATA:  Shortness of breath.  Seizures. EXAM: PORTABLE CHEST 1 VIEW COMPARISON:  08/15/2023 FINDINGS: Artifact overlies the chest. Heart size is normal. Lungs are clear. The vascularity is normal. No effusions. No abnormal bone finding. IMPRESSION: No active disease. Electronically Signed   By: Paulina Fusi M.D.   On: 2023-09-02 12:55   CT HEAD WO CONTRAST ( )  Result Date: 08/15/2023 CLINICAL DATA:  Seizure EXAM: CT HEAD WITHOUT CONTRAST TECHNIQUE: Contiguous axial images were obtained from the base of the skull through the vertex without intravenous contrast. RADIATION DOSE REDUCTION: This exam was performed according to the departmental dose-optimization program which includes automated exposure  control, adjustment of the mA and/or kV according to patient size and/or use of iterative reconstruction technique. COMPARISON:  12/30/2016 FINDINGS: Brain: There is no mass, hemorrhage or extra-axial collection. The appearance of the white matter is normal for the patient's age. There is generalized atrophy. Vascular: Atherosclerotic calcification of the internal carotid arteries at the skull base. No abnormal hyperdensity of the major intracranial arteries or dural venous sinuses. Skull: The visualized skull base, calvarium and extracranial soft tissues are normal. Sinuses/Orbits: Small amount of right mastoid fluid. Minimal left sphenoid sinus mucosal thickening. Normal orbits. IMPRESSION: 1. No acute intracranial abnormality. 2. Generalized atrophy. 3. Small amount of right mastoid fluid. Electronically Signed   By: Deatra Robinson M.D.   On: 08/15/2023 21:22   US Abdomen Limited RUQ (LIVER/GB)  Result Date: 08/15/2023 CLINICAL DATA:  Elevated liver function tests. EXAM: ULTRASOUND ABDOMEN LIMITED RIGHT UPPER QUADRANT COMPARISON:  None  Available. FINDINGS: Gallbladder: No gallstones or wall thickening visualized (1.2 mm). No sonographic Murphy sign noted by sonographer. Common bile duct: Diameter: 4.3 mm Liver: No focal lesion identified. Diffusely increased echogenicity of the liver parenchyma is noted. Portal vein is patent on color Doppler imaging with normal direction of blood flow towards the liver. Other: None. IMPRESSION: Hepatic steatosis without focal liver lesions. Electronically Signed   By: Aram Candela M.D.   On: 08/15/2023 20:40   DG CHEST PORT 1 VIEW  Result Date: 08/15/2023 CLINICAL DATA:  Dyspnea EXAM: PORTABLE CHEST 1 VIEW COMPARISON:  02/06/2020 FINDINGS: Normal cardiac size. No focal opacity or pleural effusion. No pneumothorax. IMPRESSION: No active disease. Electronically Signed   By: Jasmine Pang M.D.   On: 08/15/2023 20:28      Subjective: No significant  events overnight, he denies any complaints today, eager to go home today  Discharge Exam: Vitals:   08/20/23 0400 08/20/23 0800  BP: 116/68 119/81  Pulse: 70 74  Resp: 14 18  Temp: 98 F (36.7 C) 98.2 F (36.8 C)  SpO2: 99% 98%   Vitals:   08/19/23 2013 08/20/23 0010 08/20/23 0400 08/20/23 0800  BP: 106/75 114/79 116/68 119/81  Pulse:   70 74  Resp:  16 14 18   Temp: 98.5 F (36.9 C) 98 F (36.7 C) 98 F (36.7 C) 98.2 F (36.8 C)  TempSrc: Oral Oral Oral Oral  SpO2:   99% 98%  Weight:      Height:        General: Pt is alert, awake, not in acute distress Cardiovascular: RRR, S1/S2 +, no rubs, no gallops Respiratory: CTA bilaterally, no wheezing, no rhonchi Abdominal: Soft, NT, ND, bowel sounds + Extremities: no edema, no cyanosis    The results of significant diagnostics from this hospitalization (including imaging, microbiology, ancillary and laboratory) are listed below for reference.     Microbiology: No results found for this or any previous visit (from the past 240 hour(s)).   Labs: BNP (last 3  results) Recent Labs    08/18/23 0346 08/19/23 0331 08/20/23 0451  BNP 68.2 77.5 115.5*   Basic Metabolic Panel: Recent Labs  Lab 08/15/23 2215 08/16/23 1248 08/17/23 0818 08/17/23 1025 08/18/23 0346 08/19/23 0331 08/20/23 0451  NA 129* 131* 130* 128* 131* 133* 129*  K 3.4* 3.4* 4.1 3.7 4.0 3.7 3.5  CL 93* 95* 98 97* 100 99 96*  CO2 23 22 20* 20* 18* 21* 24  GLUCOSE 100* 93 90 99 91 98 100*  BUN <5* <5* <5* 5* 6* 6* 5*  CREATININE  0.78 0.72 0.79 0.73 0.71 0.62 0.67  CALCIUM 8.9 9.1 9.9 9.5 9.2 9.6 9.4  MG 1.9 1.7 1.7 1.9 1.7 2.1 1.6*  PHOS 2.9 2.5  --   --  3.1 4.0 3.3   Liver Function Tests: Recent Labs  Lab 08/16/23 1248 08/17/23 0818 08/18/23 0346 08/19/23 0331 08/20/23 0451  AST 110* 108* 146* 177* 155*  ALT 47* 42 39 39 37  ALKPHOS 59 64 60 55 61  BILITOT 1.3* 1.1 0.9 0.8 0.9  PROT 7.6 8.1 7.6 6.9 6.7  ALBUMIN 3.6 3.7 3.4* 3.0* 2.8*   No results for input(s): "LIPASE", "AMYLASE" in the last 168 hours. Recent Labs  Lab 08/15/23 1918  AMMONIA 39*   CBC: Recent Labs  Lab 08/15/23 1800 08/16/23 1248 08/17/23 1025 08/18/23 0346 08/19/23 0331 08/20/23 0451  WBC 4.6 3.2* 4.3 3.6* 2.9* 3.0*  NEUTROABS 3.7  --  2.8 2.3 1.6* 1.4*  HGB 12.2* 13.3 13.8 14.1 13.6 13.3  HCT 35.6* 37.3* 39.3 39.8 39.0 38.5*  MCV 93.9 94.2 92.0 93.0 93.5 95.3  PLT 38* 36* 39* 42* 47* 51*   Cardiac Enzymes: Recent Labs  Lab 08/15/23 2215 08/17/23 0323 08/18/23 0346  CKTOTAL 726* 2,052* 6,720*   BNP: Invalid input(s): "POCBNP" CBG: No results for input(s): "GLUCAP" in the last 168 hours. D-Dimer No results for input(s): "DDIMER" in the last 72 hours. Hgb A1c No results for input(s): "HGBA1C" in the last 72 hours. Lipid Profile No results for input(s): "CHOL", "HDL", "LDLCALC", "TRIG", "CHOLHDL", "LDLDIRECT" in the last 72 hours. Thyroid function studies No results for input(s): "TSH", "T4TOTAL", "T3FREE", "THYROIDAB" in the last 72 hours.  Invalid input(s):  "FREET3" Anemia work up No results for input(s): "VITAMINB12", "FOLATE", "FERRITIN", "TIBC", "IRON", "RETICCTPCT" in the last 72 hours. Urinalysis    Component Value Date/Time   COLORURINE STRAW (A) 08/15/2023 2105   APPEARANCEUR CLEAR 08/15/2023 2105   LABSPEC 1.006 08/15/2023 2105   PHURINE 8.0 08/15/2023 2105   GLUCOSEU NEGATIVE 08/15/2023 2105   HGBUR NEGATIVE 08/15/2023 2105   BILIRUBINUR NEGATIVE 08/15/2023 2105   KETONESUR NEGATIVE 08/15/2023 2105   PROTEINUR NEGATIVE 08/15/2023 2105   UROBILINOGEN 2.0 (H) 09/15/2022 1521   NITRITE NEGATIVE 08/15/2023 2105   LEUKOCYTESUR NEGATIVE 08/15/2023 2105   Sepsis Labs Recent Labs  Lab 08/17/23 1025 08/18/23 0346 08/19/23 0331 08/20/23 0451  WBC 4.3 3.6* 2.9* 3.0*   Microbiology No results found for this or any previous visit (from the past 240 hour(s)).   Time coordinating discharge: Over 30 minutes  SIGNED:   Huey Bienenstock, MD  [facility] 08/20/2023, 10:11 AM Pager   If 7PM-7AM, please contact night-coverage [email or web address] Password [email or web address]

## 2023-08-20 NOTE — TOC CM/SW Note (Cosign Needed Addendum)
    Durable Medical Equipment  (From admission, onward)           Start     Ordered   08/20/23 1102  For home use only DME Walker rolling  Once       Question Answer Comment  Walker: With 5 Inch Wheels   Patient needs a walker to treat with the following condition Acute metabolic encephalopathy   Patient needs a walker to treat with the following condition CAD (coronary artery disease)      08/20/23 1101

## 2023-08-22 NOTE — Plan of Care (Signed)
[facility] Tonsillectomy/Adenoidectomy, Postoperative PEDS care plan entered in error.

## 2023-09-15 ENCOUNTER — Ambulatory Visit (HOSPITAL_COMMUNITY)
Admission: EM | Admit: 2023-09-15 | Discharge: 2023-09-15 | Disposition: A | Discharge status: Home / Self Care | Payer: No Typology Code available for payment source | Attending: Emergency Medicine | Admitting: Emergency Medicine

## 2023-09-15 ENCOUNTER — Encounter (HOSPITAL_COMMUNITY): Payer: Self-pay

## 2023-09-15 DIAGNOSIS — R21 Rash and other nonspecific skin eruption: Secondary | ICD-10-CM

## 2023-09-15 DIAGNOSIS — B354 Tinea corporis: Secondary | ICD-10-CM

## 2023-09-15 HISTORY — DX: Unspecified convulsions: R56.9

## 2023-09-15 MED ORDER — DIPHENHYDRAMINE HCL 25 MG PO TABS: 25.0000 mg | ORAL_TABLET | Freq: Four times a day (QID) | ORAL | 0 refills | Status: AC | PRN

## 2023-09-15 MED ORDER — CLOTRIMAZOLE 1 % EX CREA: TOPICAL_CREAM | CUTANEOUS | 0 refills | Status: AC

## 2023-09-15 NOTE — ED Triage Notes (Signed)
Patient c/o a rash on bilateral legs x 1-2 weeks ago.  Paatient states he has been using hand lotion to the rash.

## 2023-09-15 NOTE — Discharge Instructions (Addendum)
The rash on your legs is suspicious for tinea.  Use the Lotrimin to the areas on your legs twice daily.  If you have any breakthrough itching you can take Benadryl every 6 hours as needed, do not drink or drive on this medication as it may cause drowsiness.  If the rash does not improve over the next week or so, please follow-up with your primary care provider.

## 2023-09-15 NOTE — ED Provider Notes (Signed)
[facility]    CSN: [id number] Arrival date & time: 09/15/23  1527      History   Chief Complaint Chief Complaint  Patient presents with   Rash    HPI William Baxter is a 62 y.o. male.   Patient presents to clinic complaining of an itchy rash to both legs that he noticed after being discharged from the hospital.  He has been using a lotion to the areas without much improvement.  His blood pressure was on the lower end today in clinic.  He has not had any alcohol since September.  He did have a sandwich and then a meal at lunchtime.  He denies any syncope.  On 08/15/23 patient was admitted for ETOH withdraw and seizure.   The history is provided by the patient and medical records.  Rash   Past Medical History:  Diagnosis Date   BPH (benign prostatic hyperplasia)    Heart disease    Medical history non-contributory    Migraine    Myocardial infarction acute (HCC)    Seizures (HCC)     Patient Active Problem List   Diagnosis Date Noted   Hyponatremia 08/16/2023   Alcohol withdrawal (HCC) 08/15/2023   Alcohol withdrawal seizure (HCC) 08/15/2023   Thrombocytopenia (HCC) 08/15/2023   CAD S/P percutaneous coronary angioplasty 08/15/2023   Elevated LFTs 08/15/2023   Radiculopathy, cervical region 02/05/2020   Chest pain with moderate risk for cardiac etiology 06/14/2016   Chest pain, moderate coronary artery risk 06/14/2016   Headache    Acute coronary syndrome (HCC) 12/16/2013    Past Surgical History:  Procedure Laterality Date   CARDIAC CATHETERIZATION         Home Medications    Prior to Admission medications   Medication Sig Start Date End Date Taking? Authorizing Provider  clotrimazole (LOTRIMIN) 1 % cream Apply to affected area 2 times daily 09/15/23  Yes Rinaldo Ratel, [place] N, FNP  diphenhydrAMINE (BENADRYL) 25 MG tablet Take 1 tablet (25 mg total) by mouth every 6 (six) hours as needed. 09/15/23  Yes Rinaldo Ratel, [place] N, FNP   acetaminophen (TYLENOL) 325 MG tablet Take 325 mg by mouth every 6 (six) hours as needed for mild pain.     [provider]  albuterol (PROVENTIL HFA;VENTOLIN HFA) 108 (90 Base) MCG/ACT inhaler Inhale 2 puffs into the lungs every 6 (six) hours as needed for wheezing or shortness of breath. 06/15/16   Joseph Art, DO  atorvastatin (LIPITOR) 80 MG tablet Take 1 tablet (80 mg total) by mouth daily at 6 PM. 12/19/13   Orpah Cobb, MD  feeding supplement (ENSURE ENLIVE / ENSURE PLUS) LIQD Take 237 mLs by mouth 2 (two) times daily between meals. 08/20/23   Elgergawy, Leana Roe, MD  folic acid (FOLVITE) 1 MG tablet Take 1 tablet (1 mg total) by mouth daily. 08/21/23   Elgergawy, Leana Roe, MD  isosorbide mononitrate (IMDUR) 60 MG 24 hr tablet Take 60 mg by mouth daily.    [provider]  magnesium oxide (MAG-OX) 400 (240 Mg) MG tablet Take 1 tablet (400 mg total) by mouth daily. 08/20/23   Elgergawy, Leana Roe, MD  metoprolol (LOPRESSOR) 25 MG tablet Take 1 tablet (25 mg total) by mouth 2 (two) times daily. 06/15/16   Joseph Art, DO  Multiple Vitamin (MULTIVITAMIN WITH MINERALS) TABS tablet Take 1 tablet by mouth daily. 08/21/23   Elgergawy, Leana Roe, MD  nitroGLYCERIN (NITROSTAT) 0.4 MG SL tablet Place 1 tablet (0.4  mg total) under the tongue every 5 (five) minutes as needed for chest pain. 12/19/13   Orpah Cobb, MD  pantoprazole (PROTONIX) 40 MG tablet Take 1 tablet (40 mg total) by mouth daily. 08/21/23   Elgergawy, Leana Roe, MD  polyvinyl alcohol (LIQUIFILM TEARS) 1.4 % ophthalmic solution Place 1 drop into both eyes as needed for dry eyes.    [provider]  tamsulosin (FLOMAX) 0.4 MG CAPS capsule Take 0.4 mg by mouth daily.    [provider]  thiamine (VITAMIN B1) 100 MG tablet Take 1 tablet (100 mg total) by mouth daily. 08/21/23   Elgergawy, Leana Roe, MD    Family History History reviewed. No pertinent family history.  Social History Social History    Tobacco Use   Smoking status: Every Day    Types: Cigarettes   Smokeless tobacco: Never  Vaping Use   Vaping status: Never Used  Substance Use Topics   Alcohol use: Not Currently    Comment: occ   Drug use: No     Allergies   Penicillins   Review of Systems Review of Systems  Per HPI   Physical Exam Triage Vital Signs ED Triage Vitals  Encounter Vitals Group     BP 09/15/23 1654 (!) 89/52     Systolic BP Percentile --      Diastolic BP Percentile --      Pulse Rate 09/15/23 1654 76     Resp 09/15/23 1654 16     Temp 09/15/23 1654 98.2 F (36.8 C)     Temp Source 09/15/23 1654 Oral     SpO2 09/15/23 1654 95 %     Weight --      Height --      Head Circumference --      Peak Flow --      Pain Score 09/15/23 1657 0     Pain Loc --      Pain Education --      Exclude from Growth Chart --    No data found.  Updated Vital Signs BP 93/60 (BP Location: Right Arm)   Pulse 76   Temp 98.2 F (36.8 C) (Oral)   Resp 16   SpO2 95%   Visual Acuity Right Eye Distance:   Left Eye Distance:   Bilateral Distance:    Right Eye Near:   Left Eye Near:    Bilateral Near:     Physical Exam Vitals and nursing note reviewed.  Constitutional:      Appearance: Normal appearance.  HENT:     Head: Normocephalic and atraumatic.     Right Ear: External ear normal.     Left Ear: External ear normal.     Nose: Nose normal.     Mouth/Throat:     Mouth: Mucous membranes are moist.  Eyes:     Conjunctiva/sclera: Conjunctivae normal.  Cardiovascular:     Rate and Rhythm: Normal rate.  Pulmonary:     Effort: Pulmonary effort is normal. No respiratory distress.  Musculoskeletal:        General: Normal range of motion.  Skin:    General: Skin is warm and dry.     Findings: Rash present. Rash is urticarial.          Comments: Itchy circular areas mainly to the thighs present bilaterally.  Area is dry.  Overall dry skin.  Neurological:     General: No focal deficit  present.     Mental Status: He is  alert.  Psychiatric:        Mood and Affect: Mood normal.      UC Treatments / Results  Labs (all labs ordered are listed, but only abnormal results are displayed) Labs Reviewed - No data to display  EKG   Radiology No results found.  Procedures Procedures (including critical care time)  Medications Ordered in UC Medications - No data to display  Initial Impression / Assessment and Plan / UC Course  I have reviewed the triage vital signs and the nursing notes.  Pertinent labs & imaging results that were available during my care of the patient were reviewed by me and considered in my medical decision making (see chart for details).  Vitals and triage reviewed, patient is hemodynamically stable.  Itchy dry rash that is circular and mainly present on the thighs.  Area suspicious for tinea, not a candidate for oral terbinafine due to compromised liver function.  Will trial topical clotrimazole.  Benadryl as needed for itching.  Encouraged follow-up with primary care provider if symptoms persist despite treatment interventions.  Good Rx coupons provided, as patient is not sure if his insurance covers medications.  Plan of care, follow-up care return precautions given, no questions at this time.     Final Clinical Impressions(s) / UC Diagnoses   Final diagnoses:  Rash and nonspecific skin eruption  Ringworm of body     Discharge Instructions      The rash on your legs is suspicious for tinea.  Use the Lotrimin to the areas on your legs twice daily.  If you have any breakthrough itching you can take Benadryl every 6 hours as needed, do not drink or drive on this medication as it may cause drowsiness.  If the rash does not improve over the next week or so, please follow-up with your primary care provider.     ED Prescriptions     Medication Sig Dispense Auth. Provider   clotrimazole (LOTRIMIN) 1 % cream Apply to affected area 2 times  daily 28 g Rinaldo Ratel, [place] N, FNP   diphenhydrAMINE (BENADRYL) 25 MG tablet Take 1 tablet (25 mg total) by mouth every 6 (six) hours as needed. 30 tablet Jinna Weinman, [place] N, [place]      PDMP not reviewed this encounter.   Maxine Huynh, [place] N, [place] 09/15/23 1726

## 2023-10-05 ENCOUNTER — Other Ambulatory Visit (HOSPITAL_COMMUNITY): Payer: Self-pay

## 2024-03-02 ENCOUNTER — Emergency Department (HOSPITAL_COMMUNITY)
Admission: EM | Admit: 2024-03-02 | Discharge: 2024-03-02 | Disposition: A | Discharge status: Home / Self Care | Attending: Emergency Medicine | Admitting: Emergency Medicine

## 2024-03-02 ENCOUNTER — Encounter (HOSPITAL_COMMUNITY): Payer: Self-pay

## 2024-03-02 ENCOUNTER — Other Ambulatory Visit (HOSPITAL_COMMUNITY): Payer: Self-pay

## 2024-03-02 DIAGNOSIS — F1010 Alcohol abuse, uncomplicated: Secondary | ICD-10-CM | POA: Diagnosis not present

## 2024-03-02 DIAGNOSIS — Y901 Blood alcohol level of 20-39 mg/100 ml: Secondary | ICD-10-CM | POA: Diagnosis not present

## 2024-03-02 DIAGNOSIS — R0789 Other chest pain: Secondary | ICD-10-CM | POA: Diagnosis present

## 2024-03-02 LAB — CBC WITH DIFFERENTIAL/PLATELET
Abs Immature Granulocytes: 0 10*3/uL (ref 0.00–0.07)
Basophils Absolute: 0 10*3/uL (ref 0.0–0.1)
Basophils Relative: 1 %
Eosinophils Absolute: 0 10*3/uL (ref 0.0–0.5)
Eosinophils Relative: 1 %
HCT: 39.9 % (ref 39.0–52.0)
Hemoglobin: 14.1 g/dL (ref 13.0–17.0)
Immature Granulocytes: 0 %
Lymphocytes Relative: 33 %
Lymphs Abs: 1.3 10*3/uL (ref 0.7–4.0)
MCH: 34 pg (ref 26.0–34.0)
MCHC: 35.3 g/dL (ref 30.0–36.0)
MCV: 96.1 fL (ref 80.0–100.0)
Monocytes Absolute: 0.5 10*3/uL (ref 0.1–1.0)
Monocytes Relative: 12 %
Neutro Abs: 2.1 10*3/uL (ref 1.7–7.7)
Neutrophils Relative %: 53 %
Platelets: 42 10*3/uL — ABNORMAL LOW (ref 150–400)
RBC: 4.15 MIL/uL — ABNORMAL LOW (ref 4.22–5.81)
RDW: 14.5 % (ref 11.5–15.5)
WBC: 3.9 10*3/uL — ABNORMAL LOW (ref 4.0–10.5)
nRBC: 0 % (ref 0.0–0.2)

## 2024-03-02 LAB — COMPREHENSIVE METABOLIC PANEL WITH GFR
ALT: 73 U/L — ABNORMAL HIGH (ref 0–44)
AST: 264 U/L — ABNORMAL HIGH (ref 15–41)
Albumin: 3.6 g/dL (ref 3.5–5.0)
Alkaline Phosphatase: 88 U/L (ref 38–126)
Anion gap: 10 (ref 5–15)
BUN: <5 mg/dL — ABNORMAL LOW (ref 8–23)
CO2: 23 mmol/L (ref 22–32)
Calcium: 9.4 mg/dL (ref 8.9–10.3)
Chloride: 101 mmol/L (ref 98–111)
Creatinine, Ser: 0.61 mg/dL (ref 0.61–1.24)
GFR, Estimated: >60 mL/min (ref >60)
Glucose, Bld: 91 mg/dL (ref 70–99)
Potassium: 3.8 mmol/L (ref 3.5–5.1)
Sodium: 134 mmol/L — ABNORMAL LOW (ref 135–145)
Total Bilirubin: 1 mg/dL (ref 0.0–1.2)
Total Protein: 7.5 g/dL (ref 6.5–8.1)

## 2024-03-02 LAB — TROPONIN I (HIGH SENSITIVITY)
Troponin I (High Sensitivity): 6 ng/L (ref <18)
Troponin I (High Sensitivity): 5 ng/L (ref <18)

## 2024-03-02 LAB — MAGNESIUM: Magnesium: 1.6 mg/dL — ABNORMAL LOW (ref 1.7–2.4)

## 2024-03-02 LAB — ETHANOL: Alcohol, Ethyl (B): 38 mg/dL — ABNORMAL HIGH (ref <15)

## 2024-03-02 MED ORDER — SODIUM CHLORIDE 0.9 % IV BOLUS
1000.0000 mL | Freq: Once | INTRAVENOUS | Status: AC
  Administered 2024-03-02: 1000 mL via INTRAVENOUS

## 2024-03-02 MED ORDER — CHLORDIAZEPOXIDE HCL 25 MG PO CAPS
ORAL_CAPSULE | ORAL | 0 refills | Status: AC
  Filled 2024-03-02: qty 10, 2d supply, fill #0

## 2024-03-02 MED ORDER — MAGNESIUM SULFATE 2 GM/50ML IV SOLN
2.0000 g | Freq: Once | INTRAVENOUS | Status: AC
  Administered 2024-03-02: 2 g via INTRAVENOUS
  Filled 2024-03-02: qty 50

## 2024-03-02 MED ORDER — CHLORDIAZEPOXIDE HCL 25 MG PO CAPS
50.0000 mg | ORAL_CAPSULE | Freq: Once | ORAL | Status: AC
  Administered 2024-03-02: 50 mg via ORAL
  Filled 2024-03-02: qty 2

## 2024-03-02 NOTE — ED Provider Notes (Signed)
 [place] EMERGENCY DEPARTMENT AT [facility] Provider Note   CSN: [id number] Arrival date & time: 03/02/24  1024     History  Chief Complaint  Patient presents with   Dizziness   Hypotension   Chest Pain    Orthostatic hypotension X3 days     William Baxter is a 63 y.o. male.  HPI Patient presents from home via EMS with concern for lightheadedness, chest pain.  History is obtained by the patient and EMS.  EMS notes that patient drinks daily, half pint.  He has been feeling weak, and today had orthostatic hypotension on their arrival.  The patient self states that he drinks occasionally, but did drink yesterday while watching a basketball game.  He felt chest pain this morning, without syncope, without seizure, does note some similar feeling to those experienced prior to seizure that occurred last year.    Home Medications Prior to Admission medications   Medication Sig Start Date End Date Taking? Authorizing Provider  chlordiazePOXIDE  (LIBRIUM ) 25 MG capsule 50mg  PO TID x 1D, then 25-50mg  PO BID X 1D, then 25-50mg  PO QD X 1D 03/02/24  Yes Dorenda Gandy, MD  acetaminophen  (TYLENOL ) 325 MG tablet Take 325 mg by mouth every 6 (six) hours as needed for mild pain.     [provider]  albuterol  (PROVENTIL  HFA;VENTOLIN  HFA) 108 (90 Base) MCG/ACT inhaler Inhale 2 puffs into the lungs every 6 (six) hours as needed for wheezing or shortness of breath. 06/15/16   Vann, Jessica U, DO  atorvastatin  (LIPITOR ) 80 MG tablet Take 1 tablet (80 mg total) by mouth daily at 6 PM. 12/19/13   Pasqual Bone, MD  clotrimazole  (LOTRIMIN ) 1 % cream Apply to affected area 2 times daily 09/15/23   Harlow Lighter, [place]  N, FNP  diphenhydrAMINE  (BENADRYL ) 25 MG tablet Take 1 tablet (25 mg total) by mouth every 6 (six) hours as needed. 09/15/23   Harlow Lighter, [place]  N, FNP  feeding supplement (ENSURE ENLIVE / ENSURE PLUS) LIQD Take 237 mLs by mouth 2 (two) times daily between meals. 08/20/23    Elgergawy, Ardia Kraft, MD  folic acid  (FOLVITE ) 1 MG tablet Take 1 tablet (1 mg total) by mouth daily. 08/21/23   Elgergawy, Ardia Kraft, MD  isosorbide  mononitrate (IMDUR ) 60 MG 24 hr tablet Take 60 mg by mouth daily.    [provider]  magnesium  oxide (MAG-OX) 400 (240 Mg) MG tablet Take 1 tablet (400 mg total) by mouth daily. 08/20/23   Elgergawy, Ardia Kraft, MD  metoprolol  (LOPRESSOR ) 25 MG tablet Take 1 tablet (25 mg total) by mouth 2 (two) times daily. 06/15/16   Vann, Jessica U, DO  Multiple Vitamin (MULTIVITAMIN WITH MINERALS) TABS tablet Take 1 tablet by mouth daily. 08/21/23   Elgergawy, Ardia Kraft, MD  nitroGLYCERIN  (NITROSTAT ) 0.4 MG SL tablet Place 1 tablet (0.4 mg total) under the tongue every 5 (five) minutes as needed for chest pain. 12/19/13   Pasqual Bone, MD  pantoprazole  (PROTONIX ) 40 MG tablet Take 1 tablet (40 mg total) by mouth daily. 08/21/23   Elgergawy, Ardia Kraft, MD  polyvinyl alcohol  (LIQUIFILM TEARS) 1.4 % ophthalmic solution Place 1 drop into both eyes as needed for dry eyes.    [provider]  tamsulosin  (FLOMAX ) 0.4 MG CAPS capsule Take 0.4 mg by mouth daily.    [provider]  thiamine  (VITAMIN B1) 100 MG tablet Take 1 tablet (100 mg total) by mouth daily. 08/21/23   Elgergawy, Ardia Kraft, MD  Allergies    Penicillins    Review of Systems   Review of Systems  Physical Exam Updated Vital Signs BP (!) 144/92   Pulse 82   Temp 98.4 F (36.9 C) (Oral)   Resp (!) 22   Ht 5\' 11"  (1.803 m)   Wt 77.1 kg   SpO2 98%   BMI 23.71 kg/m  Physical Exam Vitals and nursing note reviewed.  Constitutional:      General: He is not in acute distress.    Appearance: He is well-developed.  HENT:     Head: Normocephalic and atraumatic.  Eyes:     Conjunctiva/sclera: Conjunctivae normal.  Cardiovascular:     Rate and Rhythm: Normal rate and regular rhythm.  Pulmonary:     Effort: Pulmonary effort is normal. No respiratory distress.     Breath  sounds: No stridor.  Abdominal:     General: There is no distension.  Skin:    General: Skin is warm and dry.  Neurological:     Mental Status: He is alert and oriented to person, place, and time.     ED Results / Procedures / Treatments   Labs (all labs ordered are listed, but only abnormal results are displayed) Labs Reviewed  COMPREHENSIVE METABOLIC PANEL WITH GFR - Abnormal; Notable for the following components:      Result Value   Sodium 134 (*)    BUN <5 (*)    AST 264 (*)    ALT 73 (*)    All other components within normal limits  ETHANOL - Abnormal; Notable for the following components:   Alcohol , Ethyl (B) 38 (*)    All other components within normal limits  MAGNESIUM  - Abnormal; Notable for the following components:   Magnesium  1.6 (*)    All other components within normal limits  CBC WITH DIFFERENTIAL/PLATELET - Abnormal; Notable for the following components:   WBC 3.9 (*)    RBC 4.15 (*)    Platelets 42 (*)    All other components within normal limits  CBC WITH DIFFERENTIAL/PLATELET  CBC WITH DIFFERENTIAL/PLATELET  TROPONIN I (HIGH SENSITIVITY)  TROPONIN I (HIGH SENSITIVITY)    EKG EKG Interpretation Date/Time:  Thursday Mar 02 2024 11:20:39 EDT Ventricular Rate:  84 PR Interval:  54 QRS Duration:  80 QT Interval:  383 QTC Calculation: 453 R Axis:   73  Text Interpretation: Sinus rhythm Short PR interval Artifact Confirmed by Dorenda Gandy [phone]) on 03/02/2024 11:36:15 AM  Radiology No results found.  Procedures Procedures    Medications Ordered in ED Medications  sodium chloride  0.9 % bolus 1,000 mL (0 mLs Intravenous Stopped 03/02/24 1238)  magnesium  sulfate IVPB 2 g 50 mL (0 g Intravenous Stopped 03/02/24 1356)  chlordiazePOXIDE  (LIBRIUM ) capsule 50 mg (50 mg Oral Given 03/02/24 1501)    ED Course/ Medical Decision Making/ A&P                                 Medical Decision Making Adult male with prior seizure activity in the context of  alcohol  abuse now presents with chest pain.  On initial exam patient is awake, alert, hemodynamically unremarkable, concern for consequences of alcohol  use, near syncope, less likely ACS given the denial of current chest pain. Cardiac 90 sinus normal pulse ox 100% room air normal  Amount and/or Complexity of Data Reviewed Independent Historian: EMS External Data Reviewed: notes. Labs: ordered. Decision-making details documented  in ED Course. Radiology: ordered and independent interpretation performed. Decision-making details documented in ED Course. ECG/medicine tests: ordered and independent interpretation performed. Decision-making details documented in ED Course.  Risk Prescription drug management. Decision regarding hospitalization.   3:20 PM Patient accompanied by his wife.  Cussed all findings including the patient's mild hypomagnesemia, in the context of known alcohol  abuse.  No evidence for seizure, no decompensated state, he has been monitored for hours without substantial abnormalities on cardiac monitoring, troponins are normal, no evidence for ACS, patient started on Librium  here and discharged with same to follow-up with his primary care physician.        Final Clinical Impression(s) / ED Diagnoses Final diagnoses:  Atypical chest pain  Hypomagnesemia  Alcohol  abuse    Rx / DC Orders ED Discharge Orders          Ordered    chlordiazePOXIDE  (LIBRIUM ) 25 MG capsule        03/02/24 1520              Dorenda Gandy, MD 03/02/24 [phone]

## 2024-03-02 NOTE — Discharge Instructions (Signed)
 Please obtain and take the medication as prescribed.  Do not hesitate to return here for concerning changes in your condition.  Follow-up with your physician via telephone tomorrow to ensure appropriate ongoing outpatient management.

## 2024-03-06 ENCOUNTER — Emergency Department (HOSPITAL_COMMUNITY)
Admission: EM | Admit: 2024-03-06 | Discharge: 2024-03-06 | Disposition: A | Discharge status: Home / Self Care | Attending: Emergency Medicine | Admitting: Emergency Medicine

## 2024-03-06 ENCOUNTER — Emergency Department (HOSPITAL_COMMUNITY)

## 2024-03-06 ENCOUNTER — Other Ambulatory Visit: Payer: Self-pay

## 2024-03-06 ENCOUNTER — Encounter (HOSPITAL_COMMUNITY): Payer: Self-pay | Admitting: *Deleted

## 2024-03-06 DIAGNOSIS — Y900 Blood alcohol level of less than 20 mg/100 ml: Secondary | ICD-10-CM | POA: Insufficient documentation

## 2024-03-06 DIAGNOSIS — F1010 Alcohol abuse, uncomplicated: Secondary | ICD-10-CM | POA: Diagnosis not present

## 2024-03-06 DIAGNOSIS — Z7902 Long term (current) use of antithrombotics/antiplatelets: Secondary | ICD-10-CM | POA: Insufficient documentation

## 2024-03-06 DIAGNOSIS — W19XXXA Unspecified fall, initial encounter: Secondary | ICD-10-CM | POA: Diagnosis not present

## 2024-03-06 DIAGNOSIS — S79911A Unspecified injury of right hip, initial encounter: Secondary | ICD-10-CM | POA: Diagnosis present

## 2024-03-06 LAB — CBC WITH DIFFERENTIAL/PLATELET
Abs Immature Granulocytes: 0.01 10*3/uL (ref 0.00–0.07)
Basophils Absolute: 0 10*3/uL (ref 0.0–0.1)
Basophils Relative: 0 %
Eosinophils Absolute: 0.1 10*3/uL (ref 0.0–0.5)
Eosinophils Relative: 2 %
HCT: 42.4 % (ref 39.0–52.0)
Hemoglobin: 14.4 g/dL (ref 13.0–17.0)
Immature Granulocytes: 0 %
Lymphocytes Relative: 28 %
Lymphs Abs: 1.3 10*3/uL (ref 0.7–4.0)
MCH: 33.9 pg (ref 26.0–34.0)
MCHC: 34 g/dL (ref 30.0–36.0)
MCV: 99.8 fL (ref 80.0–100.0)
Monocytes Absolute: 0.6 10*3/uL (ref 0.1–1.0)
Monocytes Relative: 13 %
Neutro Abs: 2.5 10*3/uL (ref 1.7–7.7)
Neutrophils Relative %: 57 %
Platelets: 63 10*3/uL — ABNORMAL LOW (ref 150–400)
RBC: 4.25 MIL/uL (ref 4.22–5.81)
RDW: 15.5 % (ref 11.5–15.5)
WBC: 4.5 10*3/uL (ref 4.0–10.5)
nRBC: 0 % (ref 0.0–0.2)

## 2024-03-06 LAB — RAPID URINE DRUG SCREEN, HOSP PERFORMED
Amphetamines: NOT DETECTED
Barbiturates: NOT DETECTED
Benzodiazepines: POSITIVE — AB
Cocaine: NOT DETECTED
Opiates: NOT DETECTED
Tetrahydrocannabinol: NOT DETECTED

## 2024-03-06 LAB — COMPREHENSIVE METABOLIC PANEL WITH GFR
ALT: 48 U/L — ABNORMAL HIGH (ref 0–44)
AST: 106 U/L — ABNORMAL HIGH (ref 15–41)
Albumin: 3.1 g/dL — ABNORMAL LOW (ref 3.5–5.0)
Alkaline Phosphatase: 90 U/L (ref 38–126)
Anion gap: 5 (ref 5–15)
BUN: <5 mg/dL — ABNORMAL LOW (ref 8–23)
CO2: 23 mmol/L (ref 22–32)
Calcium: 9.2 mg/dL (ref 8.9–10.3)
Chloride: 110 mmol/L (ref 98–111)
Creatinine, Ser: 0.7 mg/dL (ref 0.61–1.24)
GFR, Estimated: >60 mL/min (ref >60)
Glucose, Bld: 99 mg/dL (ref 70–99)
Potassium: 4.1 mmol/L (ref 3.5–5.1)
Sodium: 138 mmol/L (ref 135–145)
Total Bilirubin: 1.3 mg/dL — ABNORMAL HIGH (ref 0.0–1.2)
Total Protein: 7 g/dL (ref 6.5–8.1)

## 2024-03-06 LAB — URINALYSIS, ROUTINE W REFLEX MICROSCOPIC
Bilirubin Urine: NEGATIVE
Glucose, UA: NEGATIVE mg/dL
Hgb urine dipstick: NEGATIVE
Ketones, ur: NEGATIVE mg/dL
Leukocytes,Ua: NEGATIVE
Nitrite: NEGATIVE
Protein, ur: NEGATIVE mg/dL
Specific Gravity, Urine: 1.009 (ref 1.005–1.030)
pH: 7 (ref 5.0–8.0)

## 2024-03-06 LAB — ETHANOL: Alcohol, Ethyl (B): <15 mg/dL (ref <15)

## 2024-03-06 LAB — CBG MONITORING, ED: Glucose-Capillary: 101 mg/dL — ABNORMAL HIGH (ref 70–99)

## 2024-03-06 MED ORDER — LORAZEPAM 1 MG PO TABS
1.0000 mg | ORAL_TABLET | Freq: Once | ORAL | Status: AC
  Administered 2024-03-06: 1 mg via ORAL
  Filled 2024-03-06: qty 1

## 2024-03-06 NOTE — Discharge Instructions (Signed)
 You were seen for your fall and hip pain in the emergency department.   At home, please use Tylenol , ibuprofen , and ice for your hip pain.    Check your MyChart online for the results of any tests that had not resulted by the time you left the emergency department.   Follow-up with your primary doctor in 2-3 days regarding your visit.  Follow-up with your orthopedic doctor regarding your hip pain.  Return immediately to the emergency department if you experience any of the following: Falls, worsening pain, worsening confusion, or any other concerning symptoms.    Thank you for visiting our Emergency Department. It was a pleasure taking care of you today.

## 2024-03-06 NOTE — ED Provider Notes (Signed)
 [place] EMERGENCY DEPARTMENT AT [facility] Provider Note   CSN: [id number] Arrival date & time: 03/06/24  1009     History {Add pertinent medical, surgical, social history, OB history to HPI:1} Chief Complaint  Patient presents with   Alcohol  Problem    William Baxter is a 63 y.o. male.  63 year old male with a history of alcohol  abuse, seizures, and MI who presents to the emergency department after a fall.  Patient reportedly had been sober since September until Wednesday night after the liquors lost.  Says that he had a beer and a shot that night.  Marvell Slider down and went to [employer].  Was worked up for syncope and was negative.  Says that he was discharged home on Librium  taper and has been somewhat drowsy since taking the Librium .  Has had several falls since then.  Has had some right hip pain from the falls.  Yesterday did hit his head.  Says he has not drank alcohol  since Wednesday night.  Not on blood thinners aside from aspirin .       Home Medications Prior to Admission medications   Medication Sig Start Date End Date Taking? Authorizing Provider  acetaminophen  (TYLENOL ) 325 MG tablet Take 325 mg by mouth every 6 (six) hours as needed for mild pain.     [provider]  albuterol  (PROVENTIL  HFA;VENTOLIN  HFA) 108 (90 Base) MCG/ACT inhaler Inhale 2 puffs into the lungs every 6 (six) hours as needed for wheezing or shortness of breath. 06/15/16   Vann, Jessica U, DO  atorvastatin  (LIPITOR ) 80 MG tablet Take 1 tablet (80 mg total) by mouth daily at 6 PM. 12/19/13   Pasqual Bone, MD  clotrimazole  (LOTRIMIN ) 1 % cream Apply to affected area 2 times daily 09/15/23   Harlow Lighter, [place]  N, FNP  diphenhydrAMINE  (BENADRYL ) 25 MG tablet Take 1 tablet (25 mg total) by mouth every 6 (six) hours as needed. 09/15/23   Harlow Lighter, [place]  N, FNP  feeding supplement (ENSURE ENLIVE / ENSURE PLUS) LIQD Take 237 mLs by mouth 2 (two) times daily between meals. 08/20/23    Elgergawy, Ardia Kraft, MD  folic acid  (FOLVITE ) 1 MG tablet Take 1 tablet (1 mg total) by mouth daily. 08/21/23   Elgergawy, Ardia Kraft, MD  isosorbide  mononitrate (IMDUR ) 60 MG 24 hr tablet Take 60 mg by mouth daily.    [provider]  magnesium  oxide (MAG-OX) 400 (240 Mg) MG tablet Take 1 tablet (400 mg total) by mouth daily. 08/20/23   Elgergawy, Ardia Kraft, MD  metoprolol  (LOPRESSOR ) 25 MG tablet Take 1 tablet (25 mg total) by mouth 2 (two) times daily. 06/15/16   Vann, Jessica U, DO  Multiple Vitamin (MULTIVITAMIN WITH MINERALS) TABS tablet Take 1 tablet by mouth daily. 08/21/23   Elgergawy, Ardia Kraft, MD  nitroGLYCERIN  (NITROSTAT ) 0.4 MG SL tablet Place 1 tablet (0.4 mg total) under the tongue every 5 (five) minutes as needed for chest pain. 12/19/13   Pasqual Bone, MD  pantoprazole  (PROTONIX ) 40 MG tablet Take 1 tablet (40 mg total) by mouth daily. 08/21/23   Elgergawy, Ardia Kraft, MD  polyvinyl alcohol  (LIQUIFILM TEARS) 1.4 % ophthalmic solution Place 1 drop into both eyes as needed for dry eyes.    [provider]  tamsulosin  (FLOMAX ) 0.4 MG CAPS capsule Take 0.4 mg by mouth daily.    [provider]  thiamine  (VITAMIN B1) 100 MG tablet Take 1 tablet (100 mg total) by mouth daily. 08/21/23   Elgergawy, Dawood  S, MD      Allergies    Penicillins    Review of Systems   Review of Systems  Physical Exam Updated Vital Signs BP 119/81 (BP Location: Left Arm)   Pulse 77   Temp 98.1 F (36.7 C) (Oral)   Resp 19   Ht 5\' 11"  (1.803 m)   Wt 77.1 kg   SpO2 99%   BMI 23.71 kg/m  Physical Exam Vitals and nursing note reviewed.  Constitutional:      General: He is not in acute distress.    Appearance: Normal appearance. He is well-developed. He is not ill-appearing.  HENT:     Head: Normocephalic and atraumatic.     Right Ear: External ear normal.     Left Ear: External ear normal.     Nose: Nose normal.     Mouth/Throat:     Mouth: Mucous membranes are moist.      Pharynx: Oropharynx is clear.  Eyes:     Extraocular Movements: Extraocular movements intact.     Conjunctiva/sclera: Conjunctivae normal.     Pupils: Pupils are equal, round, and reactive to light.  Neck:     Comments: No C-spine midline tenderness to palpation Cardiovascular:     Rate and Rhythm: Normal rate and regular rhythm.     Pulses: Normal pulses.     Heart sounds: Normal heart sounds.  Pulmonary:     Effort: Pulmonary effort is normal. No respiratory distress.     Breath sounds: Normal breath sounds.  Abdominal:     General: Abdomen is flat. There is no distension.     Palpations: Abdomen is soft. There is no mass.     Tenderness: There is no abdominal tenderness. There is no guarding.  Musculoskeletal:        General: No deformity. Normal range of motion.     Cervical back: Normal range of motion and neck supple. No rigidity or tenderness.     Right lower leg: No edema.     Left lower leg: No edema.     Comments: No tenderness to palpation of midline thoracic or lumbar spine.  No step-offs palpated.  No tenderness to palpation of chest wall.  No bruising noted.  No tenderness to palpation of bilateral clavicles.  No tenderness to palpation, bruising, or deformities noted of bilateral shoulders, elbows, wrists, hips, knees, or ankles.  Skin:    General: Skin is warm and dry.  Neurological:     General: No focal deficit present.     Mental Status: He is alert and oriented to person, place, and time. Mental status is at baseline.     Cranial Nerves: No cranial nerve deficit.     Sensory: No sensory deficit.     Motor: No weakness.  Psychiatric:        Mood and Affect: Mood normal.        Behavior: Behavior normal.     ED Results / Procedures / Treatments   Labs (all labs ordered are listed, but only abnormal results are displayed) Labs Reviewed - No data to display  EKG EKG Interpretation Date/Time:  Monday Mar 06 2024 10:14:50 EDT Ventricular Rate:  78 PR  Interval:  178 QRS Duration:  78 QT Interval:  386 QTC Calculation: 440 R Axis:   69  Text Interpretation: Sinus rhythm Confirmed by Shyrl Doyne [phone]) on 03/06/2024 10:17:34 AM  Radiology No results found.  Procedures Procedures  {Document cardiac monitor, telemetry assessment procedure when appropriate:1}  Medications Ordered in ED Medications - No data to display  ED Course/ Medical Decision Making/ A&P   {   Click here for ABCD2, HEART and other calculatorsREFRESH Note before signing :1}                              Medical Decision Making Amount and/or Complexity of Data Reviewed Labs: ordered. Radiology: ordered.  Risk Prescription drug management.   ***  {Document critical care time when appropriate:1} {Document review of labs and clinical decision tools ie heart score, Chads2Vasc2 etc:1}  {Document your independent review of radiology images, and any outside records:1} {Document your discussion with family members, caretakers, and with consultants:1} {Document social determinants of health affecting pt's care:1} {Document your decision making why or why not admission, treatments were needed:1} Final Clinical Impression(s) / ED Diagnoses Final diagnoses:  None    Rx / DC Orders ED Discharge Orders     None

## 2024-03-06 NOTE — ED Triage Notes (Signed)
 Patient presents to ed via [facility] from home states he has tried to detox himself since last wed, thurs. C/o weakness per ems wife states he wasn't acting right. Patient states he drinks 1 beer with a shot each day last drink was last Tues.

## 2024-05-12 ENCOUNTER — Other Ambulatory Visit: Payer: Self-pay

## 2024-05-12 ENCOUNTER — Encounter (HOSPITAL_COMMUNITY): Payer: Self-pay

## 2024-05-12 ENCOUNTER — Emergency Department (HOSPITAL_COMMUNITY)
Admission: EM | Admit: 2024-05-12 | Discharge: 2024-05-12 | Disposition: A | Discharge status: Home / Self Care | Attending: Emergency Medicine | Admitting: Emergency Medicine

## 2024-05-12 DIAGNOSIS — F1010 Alcohol abuse, uncomplicated: Secondary | ICD-10-CM | POA: Diagnosis present

## 2024-05-12 DIAGNOSIS — R7401 Elevation of levels of liver transaminase levels: Secondary | ICD-10-CM

## 2024-05-12 DIAGNOSIS — Y908 Blood alcohol level of 240 mg/100 ml or more: Secondary | ICD-10-CM | POA: Insufficient documentation

## 2024-05-12 DIAGNOSIS — D696 Thrombocytopenia, unspecified: Secondary | ICD-10-CM | POA: Insufficient documentation

## 2024-05-12 LAB — CBC WITH DIFFERENTIAL/PLATELET
Abs Immature Granulocytes: 0.01 K/uL (ref 0.00–0.07)
Basophils Absolute: 0 K/uL (ref 0.0–0.1)
Basophils Relative: 1 %
Eosinophils Absolute: 0.1 K/uL (ref 0.0–0.5)
Eosinophils Relative: 2 %
HCT: 46 % (ref 39.0–52.0)
Hemoglobin: 15.6 g/dL (ref 13.0–17.0)
Immature Granulocytes: 0 %
Lymphocytes Relative: 54 %
Lymphs Abs: 1.8 K/uL (ref 0.7–4.0)
MCH: 33.1 pg (ref 26.0–34.0)
MCHC: 33.9 g/dL (ref 30.0–36.0)
MCV: 97.7 fL (ref 80.0–100.0)
Monocytes Absolute: 0.3 K/uL (ref 0.1–1.0)
Monocytes Relative: 8 %
Neutro Abs: 1.2 K/uL — ABNORMAL LOW (ref 1.7–7.7)
Neutrophils Relative %: 35 %
Platelets: 45 K/uL — ABNORMAL LOW (ref 150–400)
RBC: 4.71 MIL/uL (ref 4.22–5.81)
RDW: 15.8 % — ABNORMAL HIGH (ref 11.5–15.5)
Smear Review: NORMAL
WBC: 3.3 K/uL — ABNORMAL LOW (ref 4.0–10.5)
nRBC: 0 % (ref 0.0–0.2)

## 2024-05-12 LAB — RAPID URINE DRUG SCREEN, HOSP PERFORMED
Amphetamines: NOT DETECTED
Barbiturates: NOT DETECTED
Benzodiazepines: NOT DETECTED
Cocaine: NOT DETECTED
Opiates: NOT DETECTED
Tetrahydrocannabinol: NOT DETECTED

## 2024-05-12 LAB — URINALYSIS, ROUTINE W REFLEX MICROSCOPIC
Bilirubin Urine: NEGATIVE
Glucose, UA: NEGATIVE mg/dL
Hgb urine dipstick: NEGATIVE
Ketones, ur: NEGATIVE mg/dL
Leukocytes,Ua: NEGATIVE
Nitrite: NEGATIVE
Protein, ur: NEGATIVE mg/dL
Specific Gravity, Urine: 1.008 (ref 1.005–1.030)
pH: 7 (ref 5.0–8.0)

## 2024-05-12 LAB — COMPREHENSIVE METABOLIC PANEL WITH GFR
ALT: 65 U/L — ABNORMAL HIGH (ref 0–44)
AST: 243 U/L — ABNORMAL HIGH (ref 15–41)
Albumin: 3.4 g/dL — ABNORMAL LOW (ref 3.5–5.0)
Alkaline Phosphatase: 99 U/L (ref 38–126)
Anion gap: 13 (ref 5–15)
BUN: <5 mg/dL — ABNORMAL LOW (ref 8–23)
CO2: 26 mmol/L (ref 22–32)
Calcium: 9.1 mg/dL (ref 8.9–10.3)
Chloride: 101 mmol/L (ref 98–111)
Creatinine, Ser: 0.64 mg/dL (ref 0.61–1.24)
GFR, Estimated: >60 mL/min (ref >60)
Glucose, Bld: 107 mg/dL — ABNORMAL HIGH (ref 70–99)
Potassium: 4 mmol/L (ref 3.5–5.1)
Sodium: 140 mmol/L (ref 135–145)
Total Bilirubin: 1.2 mg/dL (ref 0.0–1.2)
Total Protein: 8.5 g/dL — ABNORMAL HIGH (ref 6.5–8.1)

## 2024-05-12 LAB — LIPASE, BLOOD: Lipase: 29 U/L (ref 11–51)

## 2024-05-12 LAB — ETHANOL: Alcohol, Ethyl (B): 325 mg/dL (ref <15)

## 2024-05-12 MED ORDER — CHLORDIAZEPOXIDE HCL 25 MG PO CAPS: ORAL_CAPSULE | ORAL | 0 refills | Status: DC

## 2024-05-12 NOTE — ED Provider Notes (Signed)
 [place] EMERGENCY DEPARTMENT AT [facility] Provider Note   CSN: [id number] Arrival date & time: 05/12/24  1735     Patient presents with: Alcohol  Intoxication   William Baxter is a 63 y.o. male.    Alcohol  Intoxication   This patient is a 63 year old male with a history of fairly heavy alcohol  use.  The patient reports to me that he has been drinking steadily since September, his wife at the bedside reports that he has had withdrawal seizures in the past, he has not had any seizures in the last year and drinks almost every day in fact he drank a lot of liquor earlier today.  He reports that he wants to stop drinking and wants help with going to detox, his expectation is that he would be admitted to this hospital for detox at this time.  He has difficulty eating because he does not have much appetite but is not vomiting or having diarrhea and has no chest pain or shortness of breath    Prior to Admission medications   Medication Sig Start Date End Date Taking? Authorizing Provider  chlordiazePOXIDE  (LIBRIUM ) 25 MG capsule 50mg  PO TID x 1D, then 25-50mg  PO BID X 1D, then 25-50mg  PO QD X 1D 05/12/24  Yes Cleotilde Rogue, MD  acetaminophen  (TYLENOL ) 325 MG tablet Take 325 mg by mouth every 6 (six) hours as needed for mild pain.     [provider]  albuterol  (PROVENTIL  HFA;VENTOLIN  HFA) 108 (90 Base) MCG/ACT inhaler Inhale 2 puffs into the lungs every 6 (six) hours as needed for wheezing or shortness of breath. 06/15/16   Vann, Jessica U, DO  atorvastatin  (LIPITOR ) 80 MG tablet Take 1 tablet (80 mg total) by mouth daily at 6 PM. 12/19/13   Claudene Pacific, MD  clotrimazole  (LOTRIMIN ) 1 % cream Apply to affected area 2 times daily 09/15/23   Dreama, [place]  N, FNP  diphenhydrAMINE  (BENADRYL ) 25 MG tablet Take 1 tablet (25 mg total) by mouth every 6 (six) hours as needed. 09/15/23   Dreama, [place]  N, FNP  feeding supplement (ENSURE ENLIVE / ENSURE PLUS) LIQD Take 237  mLs by mouth 2 (two) times daily between meals. 08/20/23   Elgergawy, Brayton RAMAN, MD  folic acid  (FOLVITE ) 1 MG tablet Take 1 tablet (1 mg total) by mouth daily. 08/21/23   Elgergawy, Brayton RAMAN, MD  isosorbide  mononitrate (IMDUR ) 60 MG 24 hr tablet Take 60 mg by mouth daily.    [provider]  magnesium  oxide (MAG-OX) 400 (240 Mg) MG tablet Take 1 tablet (400 mg total) by mouth daily. 08/20/23   Elgergawy, Brayton RAMAN, MD  metoprolol  (LOPRESSOR ) 25 MG tablet Take 1 tablet (25 mg total) by mouth 2 (two) times daily. 06/15/16   Vann, Jessica U, DO  Multiple Vitamin (MULTIVITAMIN WITH MINERALS) TABS tablet Take 1 tablet by mouth daily. 08/21/23   Elgergawy, Brayton RAMAN, MD  nitroGLYCERIN  (NITROSTAT ) 0.4 MG SL tablet Place 1 tablet (0.4 mg total) under the tongue every 5 (five) minutes as needed for chest pain. 12/19/13   Claudene Pacific, MD  pantoprazole  (PROTONIX ) 40 MG tablet Take 1 tablet (40 mg total) by mouth daily. 08/21/23   Elgergawy, Brayton RAMAN, MD  polyvinyl alcohol  (LIQUIFILM TEARS) 1.4 % ophthalmic solution Place 1 drop into both eyes as needed for dry eyes.    [provider]  tamsulosin  (FLOMAX ) 0.4 MG CAPS capsule Take 0.4 mg by mouth daily.    [provider]  thiamine  (VITAMIN B1)  100 MG tablet Take 1 tablet (100 mg total) by mouth daily. 08/21/23   Elgergawy, Brayton RAMAN, MD    Allergies: Penicillins    Review of Systems  All other systems reviewed and are negative.   Updated Vital Signs BP 113/78 (BP Location: Left Arm)   Pulse 80   Temp 98 F (36.7 C) (Oral)   Resp 17   Ht 1.803 m (5' 11)   Wt 74.8 kg   SpO2 93%   BMI 23.01 kg/m   Physical Exam Vitals and nursing note reviewed.  Constitutional:      General: He is not in acute distress.    Appearance: He is well-developed.  HENT:     Head: Normocephalic and atraumatic.     Mouth/Throat:     Pharynx: No oropharyngeal exudate.  Eyes:     General: No scleral icterus.       Right eye: No discharge.         Left eye: No discharge.     Conjunctiva/sclera: Conjunctivae normal.     Pupils: Pupils are equal, round, and reactive to light.  Neck:     Thyroid: No thyromegaly.     Vascular: No JVD.  Cardiovascular:     Rate and Rhythm: Normal rate and regular rhythm.     Heart sounds: Normal heart sounds. No murmur heard.    No friction rub. No gallop.  Pulmonary:     Effort: Pulmonary effort is normal. No respiratory distress.     Breath sounds: Normal breath sounds. No wheezing or rales.  Abdominal:     General: Bowel sounds are normal. There is no distension.     Palpations: Abdomen is soft. There is no mass.     Tenderness: There is no abdominal tenderness.  Musculoskeletal:        General: No tenderness. Normal range of motion.     Cervical back: Normal range of motion and neck supple.     Right lower leg: No edema.     Left lower leg: No edema.  Lymphadenopathy:     Cervical: No cervical adenopathy.  Skin:    General: Skin is warm and dry.     Findings: No erythema or rash.  Neurological:     Mental Status: He is alert.     Coordination: Coordination normal.     Comments: Mild tremor occasionally, no asterixis, clear speech, normal mentation  Psychiatric:        Behavior: Behavior normal.     (all labs ordered are listed, but only abnormal results are displayed) Labs Reviewed  CBC WITH DIFFERENTIAL/PLATELET - Abnormal; Notable for the following components:      Result Value   WBC 3.3 (*)    RDW 15.8 (*)    Platelets 45 (*)    Neutro Abs 1.2 (*)    All other components within normal limits  COMPREHENSIVE METABOLIC PANEL WITH GFR - Abnormal; Notable for the following components:   Glucose, Bld 107 (*)    BUN <5 (*)    Total Protein 8.5 (*)    Albumin 3.4 (*)    AST 243 (*)    ALT 65 (*)    All other components within normal limits  ETHANOL - Abnormal; Notable for the following components:   Alcohol , Ethyl (B) 325 (*)    All other components within normal limits   LIPASE, BLOOD  URINALYSIS, ROUTINE W REFLEX MICROSCOPIC  RAPID URINE DRUG SCREEN, HOSP PERFORMED    EKG: None  Radiology: No results found.   Procedures   Medications Ordered in the ED - No data to display                                  Medical Decision Making   This patient presents to the ED for concern of alcohol  intoxication, this involves an extensive number of treatment options, and is a complaint that carries with it a high risk of complications and morbidity.  The differential diagnosis includes chronic alcohol  use, likely some gastritis   Co morbidities / Chronic conditions that complicate the patient evaluation  The patient has known thrombocytopenia likely from his heavy alcohol  use   Additional history obtained:  Additional history obtained from EMR External records from outside source obtained and reviewed including medical record including prior admissions to the hospital   Lab Tests:  I Ordered, and personally interpreted labs.  The pertinent results include: Thrombocytopenic but not significantly anemic, metabolic panel is reassuring except for his transaminitis which is also chronic, bilirubin is normal, alcohol  is 325 which is high as he was in May 2023, drug screen is negative, urinalysis is negative   Cardiac Monitoring: / EKG:  The patient was maintained on a cardiac monitor.  I personally viewed and interpreted the cardiac monitored which showed an underlying rhythm of: Normal sinus rhythm   Problem List / ED Course / Critical interventions / Medication management  The patient was informed that we do not place patient in this hospital for detoxing from alcohol  when they are intoxicated.  I have informed he and his significant other that I will provide him with a small amount of benzodiazepines to bridge him into getting into a outpatient detox program.  They will be given the contact information for all of these clinics and they state that  they already have one that they would like to get into that they will call first thing in the morning.  The patient appear stable for discharge she is not in alcohol  withdrawals seizures or DTs, vital signs are stable, transaminitis as expected but medically stable for admission to a detox facility as an outpatient.  They expressed her understanding and agreement and acceptance of the plan I have reviewed the patients home medicines and have made adjustments as needed    Social Determinants of Health:  ETOH abuse   Test / Admission - Considered:  Considered admission but not actively withdrawaing.      Final diagnoses:  Alcohol  abuse    ED Discharge Orders          Ordered    chlordiazePOXIDE  (LIBRIUM ) 25 MG capsule        05/12/24 2128               Cleotilde Rogue, MD 05/12/24 2128

## 2024-05-12 NOTE — ED Provider Triage Note (Signed)
 Emergency Medicine Provider Triage Evaluation Note  William Baxter  was evaluated in triage.  Pt complains of abdominal pain.  Nausea vomiting earlier but resolved now.  Last drink this morning.  Does not feel like he is having withdrawal symptoms at this time..  Review of Systems  Positive: As above Negative: As above  Physical Exam  BP 113/78 (BP Location: Left Arm)   Pulse 80   Temp 98 F (36.7 C) (Oral)   Resp 17   Ht 5' 11 (1.803 m)   Wt 74.8 kg   SpO2 93%   BMI 23.01 kg/m  Gen:   Awake, no distress   Resp:  Normal effort  MSK:   Moves extremities without difficulty  Other:   Medical Decision Making  Medically screening exam initiated at 6:31 PM.  Appropriate orders placed.  William Baxter was informed that the remainder of the evaluation will be completed by another provider, this initial triage assessment does not replace that evaluation, and the importance of remaining in the ED until their evaluation is complete.     William Loge, PA-C 05/12/24 1831

## 2024-05-12 NOTE — Discharge Instructions (Signed)

## 2024-05-12 NOTE — ED Triage Notes (Signed)
 Pt here for nausea/diarrhea/ tremors. Last drink was this morning. Pt requesting detox. Pt drinks a bottle of liquor a day.

## 2024-05-14 ENCOUNTER — Emergency Department (HOSPITAL_COMMUNITY)
Admission: EM | Admit: 2024-05-14 | Discharge: 2024-05-14 | Disposition: A | Discharge status: Home / Self Care | Attending: Emergency Medicine | Admitting: Emergency Medicine

## 2024-05-14 ENCOUNTER — Emergency Department (HOSPITAL_COMMUNITY)

## 2024-05-14 ENCOUNTER — Encounter (HOSPITAL_COMMUNITY): Payer: Self-pay | Admitting: Emergency Medicine

## 2024-05-14 DIAGNOSIS — R638 Other symptoms and signs concerning food and fluid intake: Secondary | ICD-10-CM | POA: Diagnosis not present

## 2024-05-14 DIAGNOSIS — R531 Weakness: Secondary | ICD-10-CM

## 2024-05-14 DIAGNOSIS — D696 Thrombocytopenia, unspecified: Secondary | ICD-10-CM | POA: Insufficient documentation

## 2024-05-14 DIAGNOSIS — R7401 Elevation of levels of liver transaminase levels: Secondary | ICD-10-CM | POA: Insufficient documentation

## 2024-05-14 DIAGNOSIS — R1084 Generalized abdominal pain: Secondary | ICD-10-CM | POA: Insufficient documentation

## 2024-05-14 DIAGNOSIS — F10239 Alcohol dependence with withdrawal, unspecified: Secondary | ICD-10-CM | POA: Diagnosis not present

## 2024-05-14 DIAGNOSIS — F10930 Alcohol use, unspecified with withdrawal, uncomplicated: Secondary | ICD-10-CM

## 2024-05-14 HISTORY — DX: Alcohol abuse, uncomplicated: F10.10

## 2024-05-14 LAB — RESP PANEL BY RT-PCR (RSV, FLU A&B, COVID)  RVPGX2
Influenza A by PCR: NEGATIVE
Influenza B by PCR: NEGATIVE
Resp Syncytial Virus by PCR: NEGATIVE
SARS Coronavirus 2 by RT PCR: NEGATIVE

## 2024-05-14 LAB — RAPID URINE DRUG SCREEN, HOSP PERFORMED
Amphetamines: NOT DETECTED
Barbiturates: NOT DETECTED
Benzodiazepines: POSITIVE — AB
Cocaine: NOT DETECTED
Opiates: NOT DETECTED
Tetrahydrocannabinol: NOT DETECTED

## 2024-05-14 LAB — LIPASE, BLOOD: Lipase: 28 U/L (ref 11–51)

## 2024-05-14 LAB — COMPREHENSIVE METABOLIC PANEL WITH GFR
ALT: 62 U/L — ABNORMAL HIGH (ref 0–44)
AST: 245 U/L — ABNORMAL HIGH (ref 15–41)
Albumin: 3.2 g/dL — ABNORMAL LOW (ref 3.5–5.0)
Alkaline Phosphatase: 108 U/L (ref 38–126)
Anion gap: 12 (ref 5–15)
BUN: 5 mg/dL — ABNORMAL LOW (ref 8–23)
CO2: 24 mmol/L (ref 22–32)
Calcium: 9.6 mg/dL (ref 8.9–10.3)
Chloride: 98 mmol/L (ref 98–111)
Creatinine, Ser: 0.78 mg/dL (ref 0.61–1.24)
GFR, Estimated: >60 mL/min (ref >60)
Glucose, Bld: 136 mg/dL — ABNORMAL HIGH (ref 70–99)
Potassium: 3.8 mmol/L (ref 3.5–5.1)
Sodium: 134 mmol/L — ABNORMAL LOW (ref 135–145)
Total Bilirubin: 1.9 mg/dL — ABNORMAL HIGH (ref 0.0–1.2)
Total Protein: 7.7 g/dL (ref 6.5–8.1)

## 2024-05-14 LAB — CBC WITH DIFFERENTIAL/PLATELET
Abs Immature Granulocytes: 0.01 K/uL (ref 0.00–0.07)
Basophils Absolute: 0 K/uL (ref 0.0–0.1)
Basophils Relative: 0 %
Eosinophils Absolute: 0.1 K/uL (ref 0.0–0.5)
Eosinophils Relative: 2 %
HCT: 42.6 % (ref 39.0–52.0)
Hemoglobin: 14.7 g/dL (ref 13.0–17.0)
Immature Granulocytes: 0 %
Lymphocytes Relative: 29 %
Lymphs Abs: 0.8 K/uL (ref 0.7–4.0)
MCH: 33.2 pg (ref 26.0–34.0)
MCHC: 34.5 g/dL (ref 30.0–36.0)
MCV: 96.2 fL (ref 80.0–100.0)
Monocytes Absolute: 0.3 K/uL (ref 0.1–1.0)
Monocytes Relative: 9 %
Neutro Abs: 1.7 K/uL (ref 1.7–7.7)
Neutrophils Relative %: 60 %
Platelets: 42 K/uL — ABNORMAL LOW (ref 150–400)
RBC: 4.43 MIL/uL (ref 4.22–5.81)
RDW: 15.2 % (ref 11.5–15.5)
WBC: 2.9 K/uL — ABNORMAL LOW (ref 4.0–10.5)
nRBC: 0 % (ref 0.0–0.2)

## 2024-05-14 LAB — URINALYSIS, ROUTINE W REFLEX MICROSCOPIC
Bilirubin Urine: NEGATIVE
Glucose, UA: NEGATIVE mg/dL
Hgb urine dipstick: NEGATIVE
Ketones, ur: NEGATIVE mg/dL
Leukocytes,Ua: NEGATIVE
Nitrite: NEGATIVE
Protein, ur: NEGATIVE mg/dL
Specific Gravity, Urine: 1.016 (ref 1.005–1.030)
pH: 8 (ref 5.0–8.0)

## 2024-05-14 LAB — ETHANOL: Alcohol, Ethyl (B): <15 mg/dL (ref <15)

## 2024-05-14 LAB — CBG MONITORING, ED: Glucose-Capillary: 158 mg/dL — ABNORMAL HIGH (ref 70–99)

## 2024-05-14 MED ORDER — FAMOTIDINE 20 MG PO TABS: 20.0000 mg | ORAL_TABLET | Freq: Two times a day (BID) | ORAL | 0 refills | Status: AC

## 2024-05-14 MED ORDER — CHLORDIAZEPOXIDE HCL 25 MG PO CAPS: ORAL_CAPSULE | ORAL | 0 refills | Status: AC

## 2024-05-14 MED ORDER — PANTOPRAZOLE SODIUM 40 MG IV SOLR
40.0000 mg | Freq: Once | INTRAVENOUS | Status: AC
  Administered 2024-05-14: 40 mg via INTRAVENOUS
  Filled 2024-05-14: qty 10

## 2024-05-14 MED ORDER — LORAZEPAM 2 MG/ML IJ SOLN
1.0000 mg | Freq: Once | INTRAMUSCULAR | Status: AC
  Administered 2024-05-14: 1 mg via INTRAVENOUS
  Filled 2024-05-14: qty 1

## 2024-05-14 MED ORDER — LACTATED RINGERS IV BOLUS
1000.0000 mL | Freq: Once | INTRAVENOUS | Status: AC
  Administered 2024-05-14: 1000 mL via INTRAVENOUS

## 2024-05-14 MED ORDER — ONDANSETRON 4 MG PO TBDP: 4.0000 mg | ORAL_TABLET | Freq: Three times a day (TID) | ORAL | 0 refills | Status: AC | PRN

## 2024-05-14 MED ORDER — IOHEXOL 300 MG/ML  SOLN
100.0000 mL | Freq: Once | INTRAMUSCULAR | Status: AC | PRN
  Administered 2024-05-14: 100 mL via INTRAVENOUS

## 2024-05-14 NOTE — Discharge Instructions (Addendum)
 Thank you for coming to [facility] Emergency Department. You were seen for abdominal pain, nausea, generalized weakness. We did an exam, labs, and imaging, and these showed mild elevation in your liver enzymes, mild alcohol  withdrawal, and chronically low platelets.  You were treated with fluids and benzodiazepines to help with your withdrawal.  Please take: -Librium  (chlordiazepoxide ) taper as prescribed for alcohol  withdrawal -Zofran  under the tongue 4 mg every 6-8 hours as needed for nausea/vomiting -Pepcid  20 mg twice per day for gastritis/acid reflux, in case this is contributing to your symptoms  Please return to the emergency department at [facility] tomorrow morning for right upper quadrant ultrasound.  After that, you can go to detox.  Please follow up within 1 week with a primary care physician for reevaluation and lab recheck.  Please stay well hydrated.   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   Do not hesitate to return to the ED or call 911 if you experience: -Worsening symptoms -Nausea/vomiting so severe that you cannot eat/drink anything or take your medications -Severe dehydration -Lightheadedness, passing out -Fevers/chills -Anything else that concerns you

## 2024-05-14 NOTE — ED Notes (Signed)
 Patient ambulated x1 assist

## 2024-05-14 NOTE — ED Provider Notes (Signed)
 [facility] EMERGENCY DEPARTMENT AT [facility] Provider Note   CSN: [id number] Arrival date & time: 05/14/24  1244     History  Chief Complaint  Patient presents with   Abdominal Pain    William Baxter is a 63 y.o. male with PMH as listed below who presents with constant central abdominal pain, nausea, decreased appetite, headaches, and generalized weakness. Wife at bedside states he hasn't eaten in 4 days except Ensure. Patient is having abdominal pain for 2 months. History of alcohol  abuse. Patient was seen in ED on 05/12/24 wanting to find inpatient detox program from alcohol  - was not in any active withdrawal. Was given librium  taper to bridge to outpatient treatment program and discharged. Patient feels very bad. Doesn't notice a pattern.   Past Medical History:  Diagnosis Date   Alcohol  abuse    BPH (benign prostatic hyperplasia)    Heart disease    Medical history non-contributory    Migraine    Myocardial infarction acute (HCC)    Seizures (HCC)        Home Medications Prior to Admission medications   Medication Sig Start Date End Date Taking? Authorizing Provider  famotidine  (PEPCID ) 20 MG tablet Take 1 tablet (20 mg total) by mouth 2 (two) times daily. 05/14/24  Yes Franklyn Sid SAILOR, MD  ondansetron  (ZOFRAN -ODT) 4 MG disintegrating tablet Take 1 tablet (4 mg total) by mouth every 8 (eight) hours as needed. 05/14/24  Yes Franklyn Sid SAILOR, MD  acetaminophen  (TYLENOL ) 325 MG tablet Take 325 mg by mouth every 6 (six) hours as needed for mild pain.     [provider]  albuterol  (PROVENTIL  HFA;VENTOLIN  HFA) 108 (90 Base) MCG/ACT inhaler Inhale 2 puffs into the lungs every 6 (six) hours as needed for wheezing or shortness of breath. 06/15/16   Vann, Jessica U, DO  atorvastatin  (LIPITOR ) 80 MG tablet Take 1 tablet (80 mg total) by mouth daily at 6 PM. 12/19/13   Claudene Pacific, MD  chlordiazePOXIDE  (LIBRIUM ) 25 MG capsule 50mg  PO TID x 1D, then 25-50mg  PO BID  X 1D, then 25-50mg  PO QD X 1D 05/14/24   Franklyn Sid SAILOR, MD  clotrimazole  (LOTRIMIN ) 1 % cream Apply to affected area 2 times daily 09/15/23   Dreama, [place]  N, FNP  diphenhydrAMINE  (BENADRYL ) 25 MG tablet Take 1 tablet (25 mg total) by mouth every 6 (six) hours as needed. 09/15/23   Dreama, [place]  N, FNP  feeding supplement (ENSURE ENLIVE / ENSURE PLUS) LIQD Take 237 mLs by mouth 2 (two) times daily between meals. 08/20/23   Elgergawy, Brayton RAMAN, MD  folic acid  (FOLVITE ) 1 MG tablet Take 1 tablet (1 mg total) by mouth daily. 08/21/23   Elgergawy, Brayton RAMAN, MD  isosorbide  mononitrate (IMDUR ) 60 MG 24 hr tablet Take 60 mg by mouth daily.    [provider]  magnesium  oxide (MAG-OX) 400 (240 Mg) MG tablet Take 1 tablet (400 mg total) by mouth daily. 08/20/23   Elgergawy, Brayton RAMAN, MD  metoprolol  (LOPRESSOR ) 25 MG tablet Take 1 tablet (25 mg total) by mouth 2 (two) times daily. 06/15/16   Vann, Jessica U, DO  Multiple Vitamin (MULTIVITAMIN WITH MINERALS) TABS tablet Take 1 tablet by mouth daily. 08/21/23   Elgergawy, Brayton RAMAN, MD  nitroGLYCERIN  (NITROSTAT ) 0.4 MG SL tablet Place 1 tablet (0.4 mg total) under the tongue every 5 (five) minutes as needed for chest pain. 12/19/13   Claudene Pacific, MD  pantoprazole  (PROTONIX ) 40 MG tablet Take  1 tablet (40 mg total) by mouth daily. 08/21/23   Elgergawy, Brayton RAMAN, MD  polyvinyl alcohol  (LIQUIFILM TEARS) 1.4 % ophthalmic solution Place 1 drop into both eyes as needed for dry eyes.    [provider]  tamsulosin  (FLOMAX ) 0.4 MG CAPS capsule Take 0.4 mg by mouth daily.    [provider]  thiamine  (VITAMIN B1) 100 MG tablet Take 1 tablet (100 mg total) by mouth daily. 08/21/23   Elgergawy, Brayton RAMAN, MD      Allergies    Penicillins    Review of Systems   Review of Systems A 10 point review of systems was performed and is negative unless otherwise reported in HPI.  Physical Exam Updated Vital Signs BP 121/83   Pulse 92    Temp 98.3 F (36.8 C) (Oral)   Resp 19   SpO2 96%  Physical Exam General: Fatigued-appearing male, lying in bed.  HEENT: PERRLA, Sclera anicteric, MMM, trachea midline. Mild tongue fasciculations Cardiology: RRR, no murmurs/rubs/gallops. BL radial and DP pulses equal bilaterally.  Resp: Normal respiratory rate and effort. CTAB, no wheezes, rhonchi, crackles.  Abd: Soft, diffuse nonfocal abdominal TTP, non-distended. No rebound tenderness or guarding.  GU: Deferred. MSK: No peripheral edema or signs of trauma. Extremities without deformity or TTP. No cyanosis or clubbing. Skin: warm, dry.  Back: No CVA tenderness Neuro: A&Ox4, CNs II-XII grossly intact. MAEs. Sensation grossly intact. Very fine tremor with arms extended Psych: Normal mood and affect.   ED Results / Procedures / Treatments   Labs (all labs ordered are listed, but only abnormal results are displayed) Labs Reviewed  CBC WITH DIFFERENTIAL/PLATELET - Abnormal; Notable for the following components:      Result Value   WBC 2.9 (*)    Platelets 42 (*)    All other components within normal limits  COMPREHENSIVE METABOLIC PANEL WITH GFR - Abnormal; Notable for the following components:   Sodium 134 (*)    Glucose, Bld 136 (*)    BUN 5 (*)    Albumin 3.2 (*)    AST 245 (*)    ALT 62 (*)    Total Bilirubin 1.9 (*)    All other components within normal limits  URINALYSIS, ROUTINE W REFLEX MICROSCOPIC - Abnormal; Notable for the following components:   Color, Urine AMBER (*)    All other components within normal limits  RAPID URINE DRUG SCREEN, HOSP PERFORMED - Abnormal; Notable for the following components:   Benzodiazepines POSITIVE (*)    All other components within normal limits  CBG MONITORING, ED - Abnormal; Notable for the following components:   Glucose-Capillary 158 (*)    All other components within normal limits  RESP PANEL BY RT-PCR (RSV, FLU A&B, COVID)  RVPGX2  LIPASE, BLOOD  ETHANOL    EKG EKG  Interpretation Date/Time:  Sunday May 14 2024 13:35:43 EDT Ventricular Rate:  97 PR Interval:  144 QRS Duration:  78 QT Interval:  347 QTC Calculation: 441 R Axis:   78  Text Interpretation: Sinus rhythm Confirmed by Franklyn Gills [phone]) on 05/14/2024 2:45:55 PM  Radiology CT ABDOMEN PELVIS W CONTRAST Result Date: 05/14/2024 CLINICAL DATA:  Abdominal pain, acute, nonlocalized EXAM: CT ABDOMEN AND PELVIS WITH CONTRAST TECHNIQUE: Multidetector CT imaging of the abdomen and pelvis was performed using the standard protocol following bolus administration of intravenous contrast. RADIATION DOSE REDUCTION: This exam was performed according to the departmental dose-optimization program which includes automated exposure control, adjustment of the mA and/or kV  according to patient size and/or use of iterative reconstruction technique. CONTRAST:  OMNIPAQUE  IOHEXOL  300 MG/ML  SOLN COMPARISON:  September 11, 2019 FINDINGS: Lower chest: No acute abnormality. Hepatobiliary: Profound hepatic steatosis. No discrete cholelithiasis visualized within the limitations of a motion degraded examination. Circumferential wall enhancement of the gallbladder. No definitive extrahepatic biliary ductal dilation. Pancreas: Limited evaluation secondary to motion. No adjacent fat stranding or definitive mass is identified. Spleen: Unremarkable Adrenals/Urinary Tract: Adrenal glands are unremarkable. Kidneys enhance symmetrically. No hydronephrosis. No obstructing nephrolithiasis. Bladder is decompressed with mild circumferential wall prominence. Stomach/Bowel: No evidence of bowel obstruction. Appendix is normal. Evaluation of several loops of bowel is limited secondary to motion. Stomach is unremarkable for degree of distension. Vascular/Lymphatic: Severe atherosclerotic calcifications of the aorta. Mild infrarenal abdominal aortic ectasia/aneurysm measuring 2.7 by 2.5 cm. Mild narrowing of the origin of the celiac artery  secondary to atherosclerotic calcifications. There are prominent perigastric/perisplenic varices which likely reflects underlying portal hypertension. Reproductive: Coarse calcifications of the prostate. Other: No free air or free fluid. Musculoskeletal: Curvilinear sclerosis of the RIGHT femoral head anteriorly. Predominately facet arthropathy of the lumbar spine. IMPRESSION: 1. Profound hepatic steatosis. Nonspecific enhancement of the gallbladder wall without ancillary evidence acute cholecystitis. 2. Circumferential wall thickening of the decompressed bladder, likely reflecting sequela of chronic outlet obstruction. Recommend correlation with urinalysis to exclude cystitis. 3. Mild infrarenal abdominal aortic ectasia/aneurysm measuring 2.7 cm. Recommend follow-up ultrasound every 5 years. (Ref.: J Vasc Surg. 2018; 67:2-77 and J Am Coll Radiol 2013;10(10):789-794.) 4. Curvilinear sclerosis of the RIGHT femoral head. This could reflect avascular necrosis versus degenerative change given atypical location. Aortic Atherosclerosis (ICD10-I70.0). Electronically Signed   By: Corean Salter M.D.   On: 05/14/2024 15:57    Procedures Procedures    Medications Ordered in ED Medications  pantoprazole  (PROTONIX ) injection 40 mg (has no administration in time range)  lactated ringers  bolus 1,000 mL (1,000 mLs Intravenous New Bag/Given 05/14/24 1349)  LORazepam  (ATIVAN ) injection 1 mg (1 mg Intravenous Given 05/14/24 1454)  iohexol  (OMNIPAQUE ) 300 MG/ML solution 100 mL (100 mLs Intravenous Contrast Given 05/14/24 1522)    ED Course/ Medical Decision Making/ A&P                          Medical Decision Making Amount and/or Complexity of Data Reviewed Labs: ordered. Decision-making details documented in ED Course. Radiology: ordered.  Risk Prescription drug management.    This patient presents to the ED for concern of generalized weakness, abdominal pain, nausea, this involves an extensive number of  treatment options, and is a complaint that carries with it a high risk of complications and morbidity.  I considered the following differential and admission for this acute, potentially life threatening condition.   MDM:    Infectious processes, severe metabolic derangements or electrolyte abnormalities, ischemia/ACS, heart failure, anemia, and intracranial/central processes but think these are unlikely given the history and physical exam.  Abdominal exam without peritoneal signs. No evidence of acute abdomen at this time.   CT abd reassuring. Low suspicion for acute hepatobiliary disease (including acute cholecystitis or cholangitis), acute pancreatitis (neg lipase),  appendicitis, bowel obstruction, viscus perforation, or diverticulitis. UA neg for UTI. Consider a possible component of GERD/gastritis and given protonix  IV.   Consider mild alcohol  withdrawal as well - patient has mild tongue fasciculations and mild tremors with arms extended, nausea, and anxiety, CIWA 7. Feels anxious/agitated, will give ativan  and reassess. Possible that is food aversion/gen  weakness could be due to withdrawal and librium  taper.   He has no significant anemia or electrolyte/metabolic derangements. Patient presents mildly tachycardic which improves after fluids/ativan . Likely dehydration and component withdrawal.   Clinical Course as of 05/14/24 1725  Sun May 14, 2024  1504 Urinalysis, Routine w reflex microscopic -Urine, Clean Catch(!) neg [HN]  1516 Lipase: 28 CMP shows elevated LFTs similar to a couple of days ago but elevated from priors [HN]  1718 Patient ambulated with his wife. Tolerated PO intake.  Discussed extensively with the patient and his wife at bedside about his results.  He is afebrile and hemodynamically stable with no specific right upper quadrant tenderness and overall doubt cholecystitis but given his elevated LFTs which are likely due to his alcohol  intake, will order an outpatient right  upper quadrant ultrasound to be performed tomorrow morning.  At that point I believe patient is safe to go to detox.  They only have 2 pills of his Librium  left and he is still in withdrawal, CIWA on arrival was 7, he has no symptoms now after 1 mg of Ativan , will give another Librium  taper to help get him through detox.  Also prescribed Zofran  ODT and Pepcid  20 mg twice daily in case there is a component of GERD or gastritis contributing to his symptoms.  Discussed staying well-hydrated at home and specific discharge instructions and return precautions.  He is also noted to have chronic thrombocytopenia for which he will need to be followed by his primary care physician as well. [HN]    Clinical Course User Index [HN] Franklyn Sid SAILOR, MD    Labs: I Ordered, and personally interpreted labs.  The pertinent results include:  those litsed above  Imaging Studies ordered: I ordered imaging studies including CT abd pelvis I independently visualized and interpreted imaging. I agree with the radiologist interpretation  Additional history obtained from chart review.    Cardiac Monitoring: The patient was maintained on a cardiac monitor.  I personally viewed and interpreted the cardiac monitored which showed an underlying rhythm of: NSR  Reevaluation: After the interventions noted above, I reevaluated the patient and found that they have :improved  Social Determinants of Health: Lives independently  Disposition:  DC  Co morbidities that complicate the patient evaluation  Past Medical History:  Diagnosis Date   Alcohol  abuse    BPH (benign prostatic hyperplasia)    Heart disease    Medical history non-contributory    Migraine    Myocardial infarction acute (HCC)    Seizures (HCC)      Medicines Meds ordered this encounter  Medications   lactated ringers  bolus 1,000 mL   LORazepam  (ATIVAN ) injection 1 mg   iohexol  (OMNIPAQUE ) 300 MG/ML solution 100 mL   pantoprazole  (PROTONIX )  injection 40 mg   chlordiazePOXIDE  (LIBRIUM ) 25 MG capsule    Sig: 50mg  PO TID x 1D, then 25-50mg  PO BID X 1D, then 25-50mg  PO QD X 1D    Dispense:  10 capsule    Refill:  0   ondansetron  (ZOFRAN -ODT) 4 MG disintegrating tablet    Sig: Take 1 tablet (4 mg total) by mouth every 8 (eight) hours as needed.    Dispense:  20 tablet    Refill:  0   famotidine  (PEPCID ) 20 MG tablet    Sig: Take 1 tablet (20 mg total) by mouth 2 (two) times daily.    Dispense:  30 tablet    Refill:  0    I have  reviewed the patients home medicines and have made adjustments as needed  Problem List / ED Course: Problem List Items Addressed This Visit       Hematopoietic and Hemostatic   Thrombocytopenia (HCC)     Other   Alcohol  withdrawal (HCC)   Other Visit Diagnoses       Generalized abdominal pain    -  Primary     Decreased oral intake         Generalized weakness         Transaminitis                       This note was created using dictation software, which may contain spelling or grammatical errors.    Franklyn Sid SAILOR, MD 05/14/24 1728

## 2024-05-14 NOTE — ED Triage Notes (Signed)
 Pt comes in for decreased appetite, states he hasn't eaten in 4 days except Ensure.  Patient is having abdominal pain for 2 months. Patient has been taking medication for detox for etoh.

## 2024-05-15 ENCOUNTER — Ambulatory Visit (HOSPITAL_COMMUNITY)
Admission: EM | Admit: 2024-05-15 | Discharge: 2024-05-15 | Disposition: A | Discharge status: Home / Self Care | Source: Ambulatory Visit

## 2024-05-15 ENCOUNTER — Ambulatory Visit (HOSPITAL_COMMUNITY)
Admission: RE | Admit: 2024-05-15 | Discharge: 2024-05-15 | Disposition: A | Discharge status: Home / Self Care | Source: Ambulatory Visit | Attending: Emergency Medicine | Admitting: Emergency Medicine

## 2024-05-15 DIAGNOSIS — K760 Fatty (change of) liver, not elsewhere classified: Secondary | ICD-10-CM | POA: Insufficient documentation

## 2024-05-15 DIAGNOSIS — K828 Other specified diseases of gallbladder: Secondary | ICD-10-CM | POA: Diagnosis not present

## 2024-05-15 DIAGNOSIS — R112 Nausea with vomiting, unspecified: Secondary | ICD-10-CM | POA: Insufficient documentation

## 2024-05-15 DIAGNOSIS — F1090 Alcohol use, unspecified, uncomplicated: Secondary | ICD-10-CM

## 2024-05-15 NOTE — ED Notes (Signed)
 Patient discharged home. Transported by family. AVS given and reviewed.

## 2024-05-15 NOTE — ED Provider Notes (Signed)
 [facility] Medical Screening Exam  Patient Name: William Baxter MRN: [id number] Date of Evaluation: 05/15/24 Chief Complaint:  Alcohol  detox Diagnosis:  Final diagnoses:  Alcohol  use disorder   History of Present illness: William Baxter is a 63 y.o. male with a history of alcohol  use disorder, who presents to the [facility] today accompanied by his wife and sister seeking detox from alcohol  abuse.  Assessment: Wife and sister present during this encounter with patient's verbal consent.  Wife provides some of the information and history with patient's consent.  Alcohol  consumption is reported as 3 times a week, and the patient drinks to the point of intoxication.  Both deny any other substance use.  History provided is inconsistent with the history reported in the ER on 07/11 during which patient and wife reported consistent alcohol  consumption since September of last year, with a withdrawal seizure in the past.  Both currently denied that patient has had withdrawal seizures lately.  Today, wife is reporting that it is challenging for her to continue the detox protocol at home, because patient gets dizzy when the Librium  is administered to him, rendering for somebody to be there with him 24/7, to prevent him from falling.  She reports that the reason for this presentation is for patient to be admitted to this facility today so that he can be assisted with his ADLs while he is detoxing.  Education is provided on criteria of admission to the facility based crisis center including the need to be independent with ADLs.  Education is reiterated on the Librium  taper that was provided to patient on 07/11 during the visit at the [facility], and also on 07/13 at the [facility]. Medication benefits, rationales and possible side effects are educated to pt and his family including the fact that medication can cause some lethargy, and he will need to be  monitored and be cautious as he is taking the taper. Wife and sister educated that the family will need to come up with a plan to assist patient as the Librium  taper is being completed at home, and that assistance with ADLs is not na reason for hospitalization. 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   Pt presents with a depressed, attention to personal hygiene and grooming is fair, eye contact is good, speech is clear & coherent. Thought contents are organized and logical, and pt currently denies SI/HI/AVH or paranoia. There is no evidence of delusional thoughts. There are no overt signs of psychosis. Vitals with BP 86/72 and HR of 110. Educated family on the need for fluids, verbalized understanding. Patient currently asymptomatic. Educated on the need to check vitals TID while completing the taper at home and present to the nearest ER in the event that BP remains consistently low AND patient is lethargic. Verbalized understanding.  Flowsheet Row ED from 05/15/2024 in [facility] ED from 05/14/2024 in [facility] Emergency Department at [facility] ED from 05/12/2024 in [facility] Emergency Department at [facility]  C-SSRS RISK CATEGORY Error: Question 1 not populated No Risk No Risk    Psychiatric Specialty Exam  Presentation  General Appearance:Fairly Groomed  Eye Contact:Fair  Speech:Clear and Coherent  Speech Volume:Normal  Handedness:Right   Mood and Affect  Mood:Depressed  Affect:Congruent   Thought Process  Thought Processes:Coherent  Descriptions of  Associations:Intact  Orientation:Full (Time, Place and Person)  Thought Content:Logical    Hallucinations:None  Ideas of Reference:None  Suicidal Thoughts:No  Homicidal Thoughts:No   Sensorium  Memory:Immediate Fair  Judgment:Fair  Insight:Fair   Executive  Functions  Concentration:Fair  Attention Span:Fair  Recall:No data recorded Fund of Knowledge:Fair  Language:Fair  Psychomotor Activity  Psychomotor Activity:Decreased (wheelchair & cane sometimes as per wife)  Assets  Assets:Resilience  Sleep  Sleep:Fair  Number of hours: No data recorded  Physical Exam: Physical Exam Vitals and nursing note reviewed.  Eyes:     Pupils: Pupils are equal, round, and reactive to light.  Musculoskeletal:        General: Normal range of motion.  Neurological:     General: No focal deficit present.     Mental Status: He is oriented to person, place, and time.    Review of Systems  Psychiatric/Behavioral:  Positive for depression (stable) and substance abuse (alcohol  use disorder). Negative for hallucinations, memory loss and suicidal ideas. The patient is not nervous/anxious and does not have insomnia.   All other systems reviewed and are negative.  Blood pressure (!) 86/72, pulse (!) 110, temperature 97.8 F (36.6 C), temperature source Oral, resp. rate 18, SpO2 99%. There is no height or weight on file to calculate BMI.  Musculoskeletal: Strength & Muscle Tone: within normal limits Gait & Station: normal Patient leans: N/A  [facility] MSE Discharge Disposition for Follow up and Recommendations: Based on my evaluation the patient does not appear to have an emergency medical condition and can be discharged with resources and follow up care in outpatient services for Provided with outpatient resources & also [facility].   Donia Snell, NP 05/15/2024, 1:21 PM

## 2024-05-15 NOTE — Progress Notes (Signed)
   05/15/24 1122  [facility] Triage Screening (Walk-ins at [facility] only)  How Did You Hear About Us ? Self  What Is the Reason for Your Visit/Call Today? Pt presents to [facility] accompanied by his wife. Pt reports he is looking for detox off of alcohol . Pt reports he drank alcohol  on Friday and mentions he drinks 2 times a week. Pt states he has been struggling with alcohol  since he was 63 years old. Pt reports that he was at [facility] last night and was sent here to be evaluated. Pt denies substance use in the past 24 hours, Si, Hi and AVH.  How Long Has This Been Causing You Problems? > than 6 months  Have You Recently Had Any Thoughts About Hurting Yourself? No  Are You Planning to Commit Suicide/Harm Yourself At This time? No  Have you Recently Had Thoughts About Hurting Someone Sherral? No  Are You Planning To Harm Someone At This Time? No  Physical Abuse Denies  Verbal Abuse Denies  Sexual Abuse Denies  Exploitation of patient/patient's resources Denies  Self-Neglect Denies  Possible abuse reported to: Other (Comment)  Are you currently experiencing any auditory, visual or other hallucinations? No  Have You Used Any Alcohol  or Drugs in the Past 24 Hours? No  Do you have any current medical co-morbidities that require immediate attention? No  Clinician description of patient physical appearance/behavior: calm, cooperative  What Do You Feel Would Help You the Most Today? Medication(s)  If access to [facility] was not available, would you have sought care in the Emergency Department? No  Determination of Need Routine (7 days)  Options For Referral Intensive Outpatient Therapy;Medication Management

## 2024-05-15 NOTE — Discharge Instructions (Signed)
 OBS Care Management   Writer met with the patient and his family to discuss discharge outpatient resources.  Writer provided patient with a list of substance abuse facilities with the [facility]  [facility] [address] [place], [place] [address] Phone: [phone] Or [phone]   SUD 24-Hour Care (Residential) Tom Stagg (SATP, IOP, SARRTP, SUD OP,SAS OP, Buprenorphine Outpatient Clinics):  [phone] SUD Intensive Outpatient Tom Stagg (SATP, IOP, SARRTP, SUD OP,SAS OP, Buprenorphine Outpatient Clinics):  [phone] SUD Standard Outpatient Tom Stagg (SATP, IOP, SARRTP, SUD OP,SAS OP, Buprenorphine Outpatient Clinics):  [phone]   VA Employees: To update contact information in the SUD Program Locator, contact [email or web address]         Substance Use Disorder (SUD) Program Learn more about Substance Use Disorder (SUD) NOTE: The email and phone numbers provided for the SUD Programs are for information inquiries and are not continuously monitored. [facility] without a specific SUD Program do offer SUD Treatment. Contact your local [facility] and ask for the Mental Health clinic. Many [facility] and [facility] Community Based Outpatient Clinics also offer SUD treatment. If you need immediate assistance, contact 911 or 1-800-273-TALK/8255. Veterans Crisis Line [phone] Press 1   [facility] Sexual Trauma (MST) [facility] [address] [place], [place] [address] Phone: [phone]   SUD Standard Outpatient Judi Swayne [place], LCAS:  [phone] SUD 24-Hour Care (Residential) Carle Lundborg Detemple [facility] admission coordinator):  [phone] X [phone]   [facility] [address] [place], [place] [address] Phone: [phone] Or [phone]   SUD Standard Outpatient Rosina Simpers:  [phone]   [facility] [address] [place], [place] [address] Phone: [phone] Or [phone]   SUD 24-Hour Care (Residential) Administrative contact is not available. Call the [facility] listed above. SUD Intensive Outpatient Administrative contact is not available. Call the [facility] listed above. SUD Standard Outpatient Administrative contact is not available. Call the [facility] listed above.   [facility] [address] [place], [place] [address] Phone: [phone]   SUD Intensive Outpatient Starleen Hummer, Substance Use Disorder Intensive Outpatient Program:  [phone] SUD Standard Outpatient Starleen Hummer, Substance Use Disorder Intensive Outpatient Program:  [phone] X [phone]

## 2024-06-08 ENCOUNTER — Institutional Professional Consult (permissible substitution) (HOSPITAL_COMMUNITY)

## 2024-06-08 ENCOUNTER — Institutional Professional Consult (permissible substitution)
Admission: RE | Admit: 2024-06-08 | Discharge: 2024-07-07 | Disposition: A | Discharge status: Rehabilitation Facility | Source: Other Acute Inpatient Hospital | Attending: Internal Medicine | Admitting: Internal Medicine

## 2024-06-09 DIAGNOSIS — Y95 Nosocomial condition: Secondary | ICD-10-CM

## 2024-06-09 DIAGNOSIS — J189 Pneumonia, unspecified organism: Secondary | ICD-10-CM | POA: Diagnosis not present

## 2024-06-09 DIAGNOSIS — Z930 Tracheostomy status: Secondary | ICD-10-CM | POA: Diagnosis not present

## 2024-06-09 DIAGNOSIS — J9621 Acute and chronic respiratory failure with hypoxia: Secondary | ICD-10-CM | POA: Diagnosis not present

## 2024-06-09 DIAGNOSIS — K7682 Hepatic encephalopathy: Secondary | ICD-10-CM | POA: Diagnosis not present

## 2024-06-09 LAB — MAGNESIUM: Magnesium: 1.9 mg/dL (ref 1.7–2.4)

## 2024-06-09 LAB — CBC WITH DIFFERENTIAL/PLATELET
Abs Immature Granulocytes: 0.02 K/uL (ref 0.00–0.07)
Basophils Absolute: 0 K/uL (ref 0.0–0.1)
Basophils Relative: 0 %
Eosinophils Absolute: 0.2 K/uL (ref 0.0–0.5)
Eosinophils Relative: 2 %
HCT: 29.7 % — ABNORMAL LOW (ref 39.0–52.0)
Hemoglobin: 10.1 g/dL — ABNORMAL LOW (ref 13.0–17.0)
Immature Granulocytes: 0 %
Lymphocytes Relative: 17 %
Lymphs Abs: 1.6 K/uL (ref 0.7–4.0)
MCH: 32.4 pg (ref 26.0–34.0)
MCHC: 34 g/dL (ref 30.0–36.0)
MCV: 95.2 fL (ref 80.0–100.0)
Monocytes Absolute: 0.6 K/uL (ref 0.1–1.0)
Monocytes Relative: 6 %
Neutro Abs: 7.1 K/uL (ref 1.7–7.7)
Neutrophils Relative %: 75 %
Platelets: 231 K/uL (ref 150–400)
RBC: 3.12 MIL/uL — ABNORMAL LOW (ref 4.22–5.81)
RDW: 14.9 % (ref 11.5–15.5)
WBC: 9.4 K/uL (ref 4.0–10.5)
nRBC: 0 % (ref 0.0–0.2)

## 2024-06-09 LAB — COMPREHENSIVE METABOLIC PANEL WITH GFR
ALT: 30 U/L (ref 0–44)
AST: 35 U/L (ref 15–41)
Albumin: 1.7 g/dL — ABNORMAL LOW (ref 3.5–5.0)
Alkaline Phosphatase: 106 U/L (ref 38–126)
Anion gap: 9 (ref 5–15)
BUN: 14 mg/dL (ref 8–23)
CO2: 23 mmol/L (ref 22–32)
Calcium: 8.9 mg/dL (ref 8.9–10.3)
Chloride: 110 mmol/L (ref 98–111)
Creatinine, Ser: 0.85 mg/dL (ref 0.61–1.24)
GFR, Estimated: >60 mL/min (ref >60)
Glucose, Bld: 119 mg/dL — ABNORMAL HIGH (ref 70–99)
Potassium: 4 mmol/L (ref 3.5–5.1)
Sodium: 142 mmol/L (ref 135–145)
Total Bilirubin: 0.7 mg/dL (ref 0.0–1.2)
Total Protein: 6.6 g/dL (ref 6.5–8.1)

## 2024-06-09 LAB — AMMONIA: Ammonia: 44 umol/L — ABNORMAL HIGH (ref 9–35)

## 2024-06-09 LAB — PHOSPHORUS: Phosphorus: 2.6 mg/dL (ref 2.5–4.6)

## 2024-06-10 ENCOUNTER — Institutional Professional Consult (permissible substitution) (HOSPITAL_COMMUNITY)

## 2024-06-10 DIAGNOSIS — Y95 Nosocomial condition: Secondary | ICD-10-CM

## 2024-06-10 DIAGNOSIS — K7682 Hepatic encephalopathy: Secondary | ICD-10-CM | POA: Diagnosis not present

## 2024-06-10 DIAGNOSIS — J189 Pneumonia, unspecified organism: Secondary | ICD-10-CM | POA: Diagnosis not present

## 2024-06-10 DIAGNOSIS — Z930 Tracheostomy status: Secondary | ICD-10-CM | POA: Diagnosis not present

## 2024-06-10 DIAGNOSIS — J9621 Acute and chronic respiratory failure with hypoxia: Secondary | ICD-10-CM | POA: Diagnosis not present

## 2024-06-11 DIAGNOSIS — J189 Pneumonia, unspecified organism: Secondary | ICD-10-CM | POA: Diagnosis not present

## 2024-06-11 DIAGNOSIS — Y95 Nosocomial condition: Secondary | ICD-10-CM

## 2024-06-11 DIAGNOSIS — J9621 Acute and chronic respiratory failure with hypoxia: Secondary | ICD-10-CM | POA: Diagnosis not present

## 2024-06-11 DIAGNOSIS — Z930 Tracheostomy status: Secondary | ICD-10-CM | POA: Diagnosis not present

## 2024-06-11 DIAGNOSIS — K7682 Hepatic encephalopathy: Secondary | ICD-10-CM | POA: Diagnosis not present

## 2024-06-12 DIAGNOSIS — J9621 Acute and chronic respiratory failure with hypoxia: Secondary | ICD-10-CM | POA: Diagnosis not present

## 2024-06-12 DIAGNOSIS — J189 Pneumonia, unspecified organism: Secondary | ICD-10-CM | POA: Diagnosis not present

## 2024-06-12 DIAGNOSIS — K7682 Hepatic encephalopathy: Secondary | ICD-10-CM | POA: Diagnosis not present

## 2024-06-12 DIAGNOSIS — Y95 Nosocomial condition: Secondary | ICD-10-CM

## 2024-06-12 DIAGNOSIS — Z930 Tracheostomy status: Secondary | ICD-10-CM | POA: Diagnosis not present

## 2024-06-13 DIAGNOSIS — J189 Pneumonia, unspecified organism: Secondary | ICD-10-CM | POA: Diagnosis not present

## 2024-06-13 DIAGNOSIS — J9621 Acute and chronic respiratory failure with hypoxia: Secondary | ICD-10-CM | POA: Diagnosis not present

## 2024-06-13 DIAGNOSIS — K7682 Hepatic encephalopathy: Secondary | ICD-10-CM | POA: Diagnosis not present

## 2024-06-13 DIAGNOSIS — Y95 Nosocomial condition: Secondary | ICD-10-CM

## 2024-06-13 DIAGNOSIS — Z930 Tracheostomy status: Secondary | ICD-10-CM | POA: Diagnosis not present

## 2024-06-13 LAB — CBC
HCT: 30.8 % — ABNORMAL LOW (ref 39.0–52.0)
Hemoglobin: 10.3 g/dL — ABNORMAL LOW (ref 13.0–17.0)
MCH: 32.6 pg (ref 26.0–34.0)
MCHC: 33.4 g/dL (ref 30.0–36.0)
MCV: 97.5 fL (ref 80.0–100.0)
Platelets: 214 K/uL (ref 150–400)
RBC: 3.16 MIL/uL — ABNORMAL LOW (ref 4.22–5.81)
RDW: 15.2 % (ref 11.5–15.5)
WBC: 7.6 K/uL (ref 4.0–10.5)
nRBC: 0 % (ref 0.0–0.2)

## 2024-06-13 LAB — BASIC METABOLIC PANEL WITH GFR
Anion gap: 6 (ref 5–15)
BUN: 14 mg/dL (ref 8–23)
CO2: 25 mmol/L (ref 22–32)
Calcium: 8.9 mg/dL (ref 8.9–10.3)
Chloride: 106 mmol/L (ref 98–111)
Creatinine, Ser: 0.77 mg/dL (ref 0.61–1.24)
GFR, Estimated: >60 mL/min (ref >60)
Glucose, Bld: 108 mg/dL — ABNORMAL HIGH (ref 70–99)
Potassium: 4.4 mmol/L (ref 3.5–5.1)
Sodium: 137 mmol/L (ref 135–145)

## 2024-06-13 LAB — AMMONIA: Ammonia: 69 umol/L — ABNORMAL HIGH (ref 9–35)

## 2024-06-13 LAB — MAGNESIUM: Magnesium: 1.9 mg/dL (ref 1.7–2.4)

## 2024-06-14 ENCOUNTER — Institutional Professional Consult (permissible substitution) (HOSPITAL_COMMUNITY)

## 2024-06-14 DIAGNOSIS — Y95 Nosocomial condition: Secondary | ICD-10-CM

## 2024-06-14 DIAGNOSIS — Z930 Tracheostomy status: Secondary | ICD-10-CM | POA: Diagnosis not present

## 2024-06-14 DIAGNOSIS — J9621 Acute and chronic respiratory failure with hypoxia: Secondary | ICD-10-CM | POA: Diagnosis not present

## 2024-06-14 DIAGNOSIS — J189 Pneumonia, unspecified organism: Secondary | ICD-10-CM | POA: Diagnosis not present

## 2024-06-14 DIAGNOSIS — K7682 Hepatic encephalopathy: Secondary | ICD-10-CM | POA: Diagnosis not present

## 2024-06-15 DIAGNOSIS — K7682 Hepatic encephalopathy: Secondary | ICD-10-CM | POA: Diagnosis not present

## 2024-06-15 DIAGNOSIS — Z930 Tracheostomy status: Secondary | ICD-10-CM | POA: Diagnosis not present

## 2024-06-15 DIAGNOSIS — J9621 Acute and chronic respiratory failure with hypoxia: Secondary | ICD-10-CM | POA: Diagnosis not present

## 2024-06-15 DIAGNOSIS — Y95 Nosocomial condition: Secondary | ICD-10-CM

## 2024-06-15 DIAGNOSIS — J189 Pneumonia, unspecified organism: Secondary | ICD-10-CM | POA: Diagnosis not present

## 2024-06-15 LAB — AMMONIA: Ammonia: 53 umol/L — ABNORMAL HIGH (ref 9–35)

## 2024-06-17 LAB — COMPREHENSIVE METABOLIC PANEL WITH GFR
ALT: 19 U/L (ref 0–44)
AST: 27 U/L (ref 15–41)
Albumin: 2 g/dL — ABNORMAL LOW (ref 3.5–5.0)
Alkaline Phosphatase: 120 U/L (ref 38–126)
Anion gap: 7 (ref 5–15)
BUN: 12 mg/dL (ref 8–23)
CO2: 25 mmol/L (ref 22–32)
Calcium: 9.3 mg/dL (ref 8.9–10.3)
Chloride: 105 mmol/L (ref 98–111)
Creatinine, Ser: 0.87 mg/dL (ref 0.61–1.24)
GFR, Estimated: >60 mL/min (ref >60)
Glucose, Bld: 118 mg/dL — ABNORMAL HIGH (ref 70–99)
Potassium: 3.8 mmol/L (ref 3.5–5.1)
Sodium: 137 mmol/L (ref 135–145)
Total Bilirubin: 0.5 mg/dL (ref 0.0–1.2)
Total Protein: 7.3 g/dL (ref 6.5–8.1)

## 2024-06-17 LAB — CBC
HCT: 30.8 % — ABNORMAL LOW (ref 39.0–52.0)
Hemoglobin: 10.2 g/dL — ABNORMAL LOW (ref 13.0–17.0)
MCH: 32.5 pg (ref 26.0–34.0)
MCHC: 33.1 g/dL (ref 30.0–36.0)
MCV: 98.1 fL (ref 80.0–100.0)
Platelets: 191 K/uL (ref 150–400)
RBC: 3.14 MIL/uL — ABNORMAL LOW (ref 4.22–5.81)
RDW: 15.4 % (ref 11.5–15.5)
WBC: 6.8 K/uL (ref 4.0–10.5)
nRBC: 0 % (ref 0.0–0.2)

## 2024-06-17 LAB — AMMONIA: Ammonia: 71 umol/L — ABNORMAL HIGH (ref 9–35)

## 2024-06-19 ENCOUNTER — Institutional Professional Consult (permissible substitution) (HOSPITAL_COMMUNITY)

## 2024-06-19 DIAGNOSIS — J9621 Acute and chronic respiratory failure with hypoxia: Secondary | ICD-10-CM | POA: Diagnosis not present

## 2024-06-19 DIAGNOSIS — Y95 Nosocomial condition: Secondary | ICD-10-CM

## 2024-06-19 DIAGNOSIS — K7682 Hepatic encephalopathy: Secondary | ICD-10-CM | POA: Diagnosis not present

## 2024-06-19 DIAGNOSIS — Z930 Tracheostomy status: Secondary | ICD-10-CM | POA: Diagnosis not present

## 2024-06-19 DIAGNOSIS — J189 Pneumonia, unspecified organism: Secondary | ICD-10-CM | POA: Diagnosis not present

## 2024-06-20 DIAGNOSIS — Z930 Tracheostomy status: Secondary | ICD-10-CM | POA: Diagnosis not present

## 2024-06-20 DIAGNOSIS — Y95 Nosocomial condition: Secondary | ICD-10-CM

## 2024-06-20 DIAGNOSIS — J189 Pneumonia, unspecified organism: Secondary | ICD-10-CM | POA: Diagnosis not present

## 2024-06-20 DIAGNOSIS — J9621 Acute and chronic respiratory failure with hypoxia: Secondary | ICD-10-CM | POA: Diagnosis not present

## 2024-06-20 DIAGNOSIS — K7682 Hepatic encephalopathy: Secondary | ICD-10-CM | POA: Diagnosis not present

## 2024-06-20 LAB — COMPREHENSIVE METABOLIC PANEL WITH GFR
ALT: 22 U/L (ref 0–44)
AST: 34 U/L (ref 15–41)
Albumin: 2.3 g/dL — ABNORMAL LOW (ref 3.5–5.0)
Alkaline Phosphatase: 122 U/L (ref 38–126)
Anion gap: 7 (ref 5–15)
BUN: 10 mg/dL (ref 8–23)
CO2: 25 mmol/L (ref 22–32)
Calcium: 9.3 mg/dL (ref 8.9–10.3)
Chloride: 104 mmol/L (ref 98–111)
Creatinine, Ser: 0.95 mg/dL (ref 0.61–1.24)
GFR, Estimated: >60 mL/min (ref >60)
Glucose, Bld: 109 mg/dL — ABNORMAL HIGH (ref 70–99)
Potassium: 4.4 mmol/L (ref 3.5–5.1)
Sodium: 136 mmol/L (ref 135–145)
Total Bilirubin: 0.6 mg/dL (ref 0.0–1.2)
Total Protein: 7.9 g/dL (ref 6.5–8.1)

## 2024-06-20 LAB — LEVETIRACETAM LEVEL: Levetiracetam Lvl: 28.7 ug/mL (ref 10.0–40.0)

## 2024-06-20 LAB — AMMONIA: Ammonia: 54 umol/L — ABNORMAL HIGH (ref 9–35)

## 2024-06-21 DIAGNOSIS — K7682 Hepatic encephalopathy: Secondary | ICD-10-CM | POA: Diagnosis not present

## 2024-06-21 DIAGNOSIS — J189 Pneumonia, unspecified organism: Secondary | ICD-10-CM | POA: Diagnosis not present

## 2024-06-21 DIAGNOSIS — J9621 Acute and chronic respiratory failure with hypoxia: Secondary | ICD-10-CM | POA: Diagnosis not present

## 2024-06-21 DIAGNOSIS — Z930 Tracheostomy status: Secondary | ICD-10-CM | POA: Diagnosis not present

## 2024-06-21 DIAGNOSIS — Y95 Nosocomial condition: Secondary | ICD-10-CM

## 2024-06-22 ENCOUNTER — Institutional Professional Consult (permissible substitution) (HOSPITAL_COMMUNITY)

## 2024-06-22 DIAGNOSIS — J9621 Acute and chronic respiratory failure with hypoxia: Secondary | ICD-10-CM | POA: Diagnosis not present

## 2024-06-22 DIAGNOSIS — K7682 Hepatic encephalopathy: Secondary | ICD-10-CM | POA: Diagnosis not present

## 2024-06-22 DIAGNOSIS — Z930 Tracheostomy status: Secondary | ICD-10-CM | POA: Diagnosis not present

## 2024-06-22 DIAGNOSIS — Y95 Nosocomial condition: Secondary | ICD-10-CM

## 2024-06-22 DIAGNOSIS — J189 Pneumonia, unspecified organism: Secondary | ICD-10-CM | POA: Diagnosis not present

## 2024-06-22 LAB — AMMONIA: Ammonia: 69 umol/L — ABNORMAL HIGH (ref 9–35)

## 2024-06-23 DIAGNOSIS — Y95 Nosocomial condition: Secondary | ICD-10-CM

## 2024-06-23 DIAGNOSIS — K7682 Hepatic encephalopathy: Secondary | ICD-10-CM | POA: Diagnosis not present

## 2024-06-23 DIAGNOSIS — J189 Pneumonia, unspecified organism: Secondary | ICD-10-CM | POA: Diagnosis not present

## 2024-06-23 DIAGNOSIS — Z930 Tracheostomy status: Secondary | ICD-10-CM | POA: Diagnosis not present

## 2024-06-23 DIAGNOSIS — J9621 Acute and chronic respiratory failure with hypoxia: Secondary | ICD-10-CM | POA: Diagnosis not present

## 2024-06-24 DIAGNOSIS — J9621 Acute and chronic respiratory failure with hypoxia: Secondary | ICD-10-CM | POA: Diagnosis not present

## 2024-06-24 DIAGNOSIS — K7682 Hepatic encephalopathy: Secondary | ICD-10-CM | POA: Diagnosis not present

## 2024-06-24 DIAGNOSIS — Y95 Nosocomial condition: Secondary | ICD-10-CM

## 2024-06-24 DIAGNOSIS — J189 Pneumonia, unspecified organism: Secondary | ICD-10-CM | POA: Diagnosis not present

## 2024-06-24 DIAGNOSIS — Z930 Tracheostomy status: Secondary | ICD-10-CM | POA: Diagnosis not present

## 2024-06-24 LAB — COMPREHENSIVE METABOLIC PANEL WITH GFR
ALT: 18 U/L (ref 0–44)
AST: 37 U/L (ref 15–41)
Albumin: 2.2 g/dL — ABNORMAL LOW (ref 3.5–5.0)
Alkaline Phosphatase: 118 U/L (ref 38–126)
Anion gap: 11 (ref 5–15)
BUN: 12 mg/dL (ref 8–23)
CO2: 20 mmol/L — ABNORMAL LOW (ref 22–32)
Calcium: 9.6 mg/dL (ref 8.9–10.3)
Chloride: 105 mmol/L (ref 98–111)
Creatinine, Ser: 0.88 mg/dL (ref 0.61–1.24)
GFR, Estimated: >60 mL/min (ref >60)
Glucose, Bld: 113 mg/dL — ABNORMAL HIGH (ref 70–99)
Potassium: 5 mmol/L (ref 3.5–5.1)
Sodium: 136 mmol/L (ref 135–145)
Total Bilirubin: 1 mg/dL (ref 0.0–1.2)
Total Protein: 8 g/dL (ref 6.5–8.1)

## 2024-06-24 LAB — CBC
HCT: 35.5 % — ABNORMAL LOW (ref 39.0–52.0)
Hemoglobin: 11.6 g/dL — ABNORMAL LOW (ref 13.0–17.0)
MCH: 32.7 pg (ref 26.0–34.0)
MCHC: 32.7 g/dL (ref 30.0–36.0)
MCV: 100 fL (ref 80.0–100.0)
Platelets: 132 K/uL — ABNORMAL LOW (ref 150–400)
RBC: 3.55 MIL/uL — ABNORMAL LOW (ref 4.22–5.81)
RDW: 15.3 % (ref 11.5–15.5)
WBC: 7.3 K/uL (ref 4.0–10.5)
nRBC: 0 % (ref 0.0–0.2)

## 2024-06-24 LAB — AMMONIA: Ammonia: 32 umol/L (ref 9–35)

## 2024-06-24 LAB — MAGNESIUM: Magnesium: 2 mg/dL (ref 1.7–2.4)

## 2024-06-24 NOTE — Consult Note (Signed)
 Chief Complaint: Acute respiratory failure with tracheostomy, dysphagia, poor oral intake - IR consulted for gastrostomy tube placement  Referring Provider(s): Corrin Just, MD   Supervising Physician: Johann Sieving  [facility] Status: [facility]  History of Present Illness: William Baxter is a 63 y.o. male with hx of ETOH Abuse with alcohol  withdrawal delirium, Left Pulmonary Nodule, Hepatic Steatosis, Constipation, CAD/MI/CVA, HTN, BPH, GERD. Pt initially admitted in [place] from 05/15/24 to 06/08/24 with respiratory failure and encephalopathy with elevated ammonia. [facility]. Currently on trach since 06/02/24 due to poor airway protection. Pt has been receiving tube feeds via NG tube. Due to inability to assess swallow function, and need for better gastric access for feedings, IR consulted for image guided gastrostomy tube placement.   Pt previously on ASA, last dose 06/23/24, plan to tentatively proceed with gtube placement on 06/28/24. Pt to be npo/tube feeds held midnight of 8/27.  DNR: If pulseless and not breathing No CPR or chest compressions.  In Pre-Arrest Conditions (Patient Has Pulse and Is Breathing) May intubate, use advanced airway interventions and cardioversion/ACLS medications if appropriate or indicated. May transfer to ICU.    Patient currently has DNR order in place. Discussion with the family regarding wishes.  The original DNR order is maintained during the procedure and prior treatment limitations are upheld during the procedure.   Past Medical History:  Diagnosis Date   Alcohol  abuse    BPH (benign prostatic hyperplasia)    Heart disease    Medical history non-contributory    Migraine    Myocardial infarction acute (HCC)    Seizures (HCC)     Past Surgical History:  Procedure Laterality Date   CARDIAC CATHETERIZATION       Allergies: Penicillins  Medications: Prior to Admission medications   Medication Sig Start Date End Date Taking? Authorizing Provider  acetaminophen  (TYLENOL ) 325 MG tablet Take 325 mg by mouth every 6 (six) hours as needed for mild pain.     [provider]  albuterol  (PROVENTIL  HFA;VENTOLIN  HFA) 108 (90 Base) MCG/ACT inhaler Inhale 2 puffs into the lungs every 6 (six) hours as needed for wheezing or shortness of breath. 06/15/16   Vann, Jessica U, DO  atorvastatin  (LIPITOR ) 80 MG tablet Take 1 tablet (80 mg total) by mouth daily at 6 PM. 12/19/13   Claudene Pacific, MD  chlordiazePOXIDE  (LIBRIUM ) 25 MG capsule 50mg  PO TID x 1D, then 25-50mg  PO BID X 1D, then 25-50mg  PO QD X 1D 05/14/24   Franklyn Sid SAILOR, MD  clotrimazole  (LOTRIMIN ) 1 % cream Apply to affected area 2 times daily 09/15/23   Dreama, Georgia  N, FNP  diphenhydrAMINE  (BENADRYL ) 25 MG tablet Take 1 tablet (25 mg total) by mouth every 6 (six) hours as needed. 09/15/23   Dreama, Georgia  N, FNP  famotidine  (PEPCID ) 20 MG tablet Take 1 tablet (20 mg total) by mouth 2 (two) times daily. 05/14/24   Franklyn Sid SAILOR, MD  feeding supplement (ENSURE ENLIVE / ENSURE PLUS) LIQD Take 237 mLs by mouth 2 (two) times daily between meals. 08/20/23   Elgergawy, Brayton RAMAN, MD  folic acid  (FOLVITE ) 1 MG tablet Take 1 tablet (1 mg total) by mouth daily. 08/21/23   Elgergawy, Brayton RAMAN, MD  isosorbide  mononitrate (IMDUR ) 60 MG 24 hr tablet Take 60 mg by mouth daily.    [provider]  magnesium  oxide (MAG-OX) 400 (240 Mg)  MG tablet Take 1 tablet (400 mg total) by mouth daily. 08/20/23   Elgergawy, Brayton RAMAN, MD  metoprolol  (LOPRESSOR ) 25 MG tablet Take 1 tablet (25 mg total) by mouth 2 (two) times daily. 06/15/16   Vann, Jessica U, DO  Multiple Vitamin (MULTIVITAMIN WITH MINERALS) TABS tablet Take 1 tablet by mouth daily. 08/21/23   Elgergawy, Brayton RAMAN, MD  nitroGLYCERIN  (NITROSTAT ) 0.4 MG SL tablet Place 1 tablet (0.4 mg total) under the  tongue every 5 (five) minutes as needed for chest pain. 12/19/13   Claudene Pacific, MD  ondansetron  (ZOFRAN -ODT) 4 MG disintegrating tablet Take 1 tablet (4 mg total) by mouth every 8 (eight) hours as needed. 05/14/24   Franklyn Sid SAILOR, MD  pantoprazole  (PROTONIX ) 40 MG tablet Take 1 tablet (40 mg total) by mouth daily. 08/21/23   Elgergawy, Brayton RAMAN, MD  polyvinyl alcohol  (LIQUIFILM TEARS) 1.4 % ophthalmic solution Place 1 drop into both eyes as needed for dry eyes.    [provider]  tamsulosin  (FLOMAX ) 0.4 MG CAPS capsule Take 0.4 mg by mouth daily.    [provider]  thiamine  (VITAMIN B1) 100 MG tablet Take 1 tablet (100 mg total) by mouth daily. 08/21/23   Elgergawy, Brayton RAMAN, MD     No family history on file.  Social History   Socioeconomic History   Marital status: Married    Spouse name: Not on file   Number of children: Not on file   Years of education: Not on file   Highest education level: Not on file  Occupational History   Not on file  Tobacco Use   Smoking status: Every Day    Current packs/day: 0.50    Types: Cigarettes   Smokeless tobacco: Never  Vaping Use   Vaping status: Never Used  Substance and Sexual Activity   Alcohol  use: Yes    Alcohol /week: 2.0 standard drinks of alcohol     Types: 2 Shots of liquor per week    Comment: everyday   Drug use: No   Sexual activity: Not on file  Other Topics Concern   Not on file  Social History Narrative   Not on file   Social Drivers of Health   Financial Resource Strain: Low Risk  (06/09/2024)   Received from [facility]   Overall Financial Resource Strain (CARDIA)    Difficulty of Paying Living Expenses: Not hard at all  Food Insecurity: No Food Insecurity (06/09/2024)   Received from [facility]   Hunger Vital Sign    Within the past 12 months, you worried that your food would run out before you got the money to buy more.: Never true    Within the past 12 months, the food you bought just  didn't last and you didn't have money to get more.: Never true  Transportation Needs: Patient Unable To Answer (06/08/2024)   Received from [facility]   SM SDOH Transportation Source    Has lack of transportation kept you from medical appointments or from getting medications?: Unable to respond    Has lack of transportation kept you from meetings, work, or from getting things needed for daily living?: Unable to respond  Physical Activity: Not on file  Stress: Patient Unable To Answer (06/08/2024)   Received from [facility]   [employer] of Occupational Health - Occupational Stress Questionnaire    Feeling of Stress : Patient unable to answer  Social Connections: Moderately Isolated (06/09/2024)   Received from [facility]  Social Connection and Isolation Panel    In a typical week, how many times do you talk on the phone with family, friends, or neighbors?: More than three times a week    How often do you get together with friends or relatives?: More than three times a week    How often do you attend church or religious services?: Never    Do you belong to any clubs or organizations such as church groups, unions, fraternal or athletic groups, or school groups?: No    How often do you attend meetings of the clubs or organizations you belong to?: Never    Are you married, widowed, divorced, separated, never married, or living with a partner?: Married     Review of Systems: A 12 point ROS discussed and pertinent positives are indicated in the HPI above.  All other systems are negative.  Review of Systems  Unable to perform ROS: Intubated    Vital Signs: There were no vitals taken for this visit.  Advance Care Plan: No documents on file  Physical Exam Vitals and nursing note reviewed.  Constitutional:      Comments: Awake intermittently, appears chronically ill  HENT:     Mouth/Throat:     Mouth: Mucous membranes are dry.     Pharynx: Oropharynx is clear.   Cardiovascular:     Rate and Rhythm: Normal rate.  Pulmonary:     Comments: + trach. Mildly course breath sounds bilaterally Abdominal:     Palpations: Abdomen is soft.  Musculoskeletal:     Right lower leg: No edema.     Left lower leg: No edema.  Skin:    General: Skin is warm and dry.  Neurological:     Comments: Intermittently awake, will open eyes and look at me but unable to answer questions/follow command. Not oriented.     Imaging: DG Abd 1 View Result Date: 06/19/2024 CLINICAL DATA:  Constipation. EXAM: ABDOMEN - 1 VIEW COMPARISON:  Radiographs 06/10/2024 and 06/08/2024.  CT 05/14/2024. FINDINGS: 1139 hours. Two supine views of the abdomen are submitted. Tip of the enteric tube projects over the proximal stomach, unchanged. As before, possible mild kinking just distal to the side hole. There is a stable, nonobstructive bowel gas pattern. The bones appear unchanged. IMPRESSION: Stable appearance of the abdomen. No evidence of bowel obstruction. Possible mild kinking of the enteric tube just distal to the side hole. Electronically Signed   By: Elsie Perone M.D.   On: 06/19/2024 11:50   DG CHEST PORT 1 VIEW Result Date: 06/14/2024 CLINICAL DATA:  Pleural effusion. EXAM: PORTABLE CHEST 1 VIEW COMPARISON:  June 08, 2024. FINDINGS: The heart size and mediastinal contours are within normal limits. Tracheostomy and nasogastric tubes are in grossly good position. Right lung is clear. Minimal left basilar subsegmental atelectasis or scarring is noted. There is also noted interval development possible irregular density in left upper lobe since prior exam, suggesting possible inflammation. The visualized skeletal structures are unremarkable. IMPRESSION: Support apparatus as noted above. Minimal left basilar subsegmental atelectasis or scarring. Interval development of possible irregular density in left upper lobe since prior exam 6 days ago, suggesting possible inflammation. Attention to this  on follow-up imaging is recommended. Electronically Signed   By: Lynwood Landy Raddle M.D.   On: 06/14/2024 10:23   DG Abd Portable 1V Result Date: 06/10/2024 CLINICAL DATA:  NG tube placement. EXAM: PORTABLE ABDOMEN - 1 VIEW COMPARISON:  06/08/2024 FINDINGS: Tip and side port of the enteric  tube below the diaphragm in the stomach. There may be a kink in the tubing distal to the side-port. Nonobstructive upper abdominal bowel gas pattern. IMPRESSION: Tip and side port of the enteric tube below the diaphragm in the stomach. There may be a kink in the tubing distal to the side-port. Electronically Signed   By: Andrea Gasman M.D.   On: 06/10/2024 14:57   DG CHEST PORT 1 VIEW Result Date: 06/08/2024 CLINICAL DATA:  [id number] Tracheostomy in place [facility] [id number] EXAM: PORTABLE CHEST 1 VIEW COMPARISON:  08/17/2023. FINDINGS: Low lung volume. There are asymmetrically increased interstitial markings in the left lung, when compared to the right lung. Findings are nonspecific and may be due to asymmetric pulmonary edema or atypical pneumonia. Correlate clinically. Bilateral lung fields are otherwise clear. Bilateral costophrenic angles are clear. Normal cardio-mediastinal silhouette. No acute osseous abnormalities. The soft tissues are within normal limits. Tracheostomy tube is seen with its tip approximately 7.8 cm above the carina. IMPRESSION: Asymmetrically increased interstitial markings in the left lung, when compared to the right lung. Findings are nonspecific and may be due to asymmetric pulmonary edema or atypical pneumonia. Electronically Signed   By: Ree Molt M.D.   On: 06/08/2024 17:32   DG Abd 1 View Result Date: 06/08/2024 CLINICAL DATA:  [id number] Encounter for nasogastric (NG) tube placement [id number] EXAM: ABDOMEN - 1 VIEW COMPARISON:  None Available. FINDINGS: The bowel gas pattern is non-obstructive. No evidence of pneumoperitoneum, within the limitations of a supine film. No acute osseous abnormalities. The  soft tissues are within normal limits. Surgical changes, devices, tubes and lines: Nasogastric tube is seen with its tip and side hole overlying the left upper quadrant, overlying the region of stomach body. IMPRESSION: Nasogastric tube is seen with its tip and side hole overlying the left upper quadrant, overlying the region of stomach body. Electronically Signed   By: Ree Molt M.D.   On: 06/08/2024 17:30    Labs:  CBC: Recent Labs    06/09/24 0423 06/13/24 0418 06/17/24 0405 06/24/24 0427  WBC 9.4 7.6 6.8 7.3  HGB 10.1* 10.3* 10.2* 11.6*  HCT 29.7* 30.8* 30.8* 35.5*  PLT 231 214 191 132*    COAGS: Recent Labs    08/15/23 1800  INR 1.2    BMP: Recent Labs    06/13/24 0418 06/17/24 0405 06/20/24 0915 06/24/24 0427  NA 137 137 136 136  K 4.4 3.8 4.4 5.0  CL 106 105 104 105  CO2 25 25 25  20*  GLUCOSE 108* 118* 109* 113*  BUN 14 12 10 12   CALCIUM  8.9 9.3 9.3 9.6  CREATININE 0.77 0.87 0.95 0.88  GFRNONAA >60 >60 >60 >60    LIVER FUNCTION TESTS: Recent Labs    06/09/24 0423 06/17/24 0405 06/20/24 0915 06/24/24 0427  BILITOT 0.7 0.5 0.6 1.0  AST 35 27 34 37  ALT 30 19 22 18   ALKPHOS 106 120 122 118  PROT 6.6 7.3 7.9 8.0  ALBUMIN 1.7* 2.0* 2.3* 2.2*    TUMOR MARKERS: No results for input(s): AFPTM, CEA, CA199, CHROMGRNA in the last 8760 hours.  Assessment and Plan:  William Baxter is a 64 y.o. male with hx of ETOH Abuse with alcohol  withdrawal delirium, Left Pulmonary Nodule, Hepatic Steatosis, Constipation, CAD/MI/CVA, HTN, BPH, GERD. Pt initially admitted in [place] from 05/15/24 to 06/08/24 with respiratory failure and encephalopathy with elevated ammonia. [facility]. Currently on trach  since 06/02/24 due to poor airway protection. Pt has been receiving tube feeds via NG tube. Due to inability to assess swallow function, and need for better  gastric access for feedings, IR consulted for image guided gastrostomy tube placement.   Pt previously on ASA, last dose 06/23/24, plan to tentatively proceed with gtube placement on 06/28/24. Pt to be npo/tube feeds held midnight of 8/27.  Thank you for allowing our service to participate in William Baxter 's care.  Electronically Signed: Kimble VEAR Clas, PA-C   06/24/2024, 12:17 PM      I spent a total of 20 Minutes    in face to face in clinical consultation, greater than 50% of which was counseling/coordinating care for gastrostomy tube placement.

## 2024-06-25 DIAGNOSIS — K7682 Hepatic encephalopathy: Secondary | ICD-10-CM | POA: Diagnosis not present

## 2024-06-25 DIAGNOSIS — J9621 Acute and chronic respiratory failure with hypoxia: Secondary | ICD-10-CM | POA: Diagnosis not present

## 2024-06-25 DIAGNOSIS — Z930 Tracheostomy status: Secondary | ICD-10-CM | POA: Diagnosis not present

## 2024-06-25 DIAGNOSIS — Y95 Nosocomial condition: Secondary | ICD-10-CM

## 2024-06-25 DIAGNOSIS — J189 Pneumonia, unspecified organism: Secondary | ICD-10-CM | POA: Diagnosis not present

## 2024-06-26 DIAGNOSIS — K7682 Hepatic encephalopathy: Secondary | ICD-10-CM | POA: Diagnosis not present

## 2024-06-26 DIAGNOSIS — Y95 Nosocomial condition: Secondary | ICD-10-CM

## 2024-06-26 DIAGNOSIS — Z930 Tracheostomy status: Secondary | ICD-10-CM | POA: Diagnosis not present

## 2024-06-26 DIAGNOSIS — J9621 Acute and chronic respiratory failure with hypoxia: Secondary | ICD-10-CM | POA: Diagnosis not present

## 2024-06-26 DIAGNOSIS — J189 Pneumonia, unspecified organism: Secondary | ICD-10-CM | POA: Diagnosis not present

## 2024-06-27 DIAGNOSIS — J189 Pneumonia, unspecified organism: Secondary | ICD-10-CM | POA: Diagnosis not present

## 2024-06-27 DIAGNOSIS — J9621 Acute and chronic respiratory failure with hypoxia: Secondary | ICD-10-CM | POA: Diagnosis not present

## 2024-06-27 DIAGNOSIS — Y95 Nosocomial condition: Secondary | ICD-10-CM

## 2024-06-27 DIAGNOSIS — K7682 Hepatic encephalopathy: Secondary | ICD-10-CM | POA: Diagnosis not present

## 2024-06-27 DIAGNOSIS — Z930 Tracheostomy status: Secondary | ICD-10-CM | POA: Diagnosis not present

## 2024-06-27 LAB — URINALYSIS, W/ REFLEX TO CULTURE (INFECTION SUSPECTED)
Bilirubin Urine: NEGATIVE
Glucose, UA: NEGATIVE mg/dL
Hgb urine dipstick: NEGATIVE
Ketones, ur: NEGATIVE mg/dL
Nitrite: POSITIVE — AB
Protein, ur: 30 mg/dL — AB
Specific Gravity, Urine: 1.012 (ref 1.005–1.030)
WBC, UA: >50 WBC/hpf (ref 0–5)
pH: 8 (ref 5.0–8.0)

## 2024-06-27 LAB — CULTURE, RESPIRATORY W GRAM STAIN

## 2024-06-27 NOTE — Progress Notes (Signed)
 Rounds with RN, no concerns for pt pulling at anything. Continue to monitor.

## 2024-06-28 DIAGNOSIS — Y95 Nosocomial condition: Secondary | ICD-10-CM

## 2024-06-28 DIAGNOSIS — J189 Pneumonia, unspecified organism: Secondary | ICD-10-CM | POA: Diagnosis not present

## 2024-06-28 DIAGNOSIS — J9621 Acute and chronic respiratory failure with hypoxia: Secondary | ICD-10-CM | POA: Diagnosis not present

## 2024-06-28 DIAGNOSIS — Z930 Tracheostomy status: Secondary | ICD-10-CM | POA: Diagnosis not present

## 2024-06-28 DIAGNOSIS — K7682 Hepatic encephalopathy: Secondary | ICD-10-CM | POA: Diagnosis not present

## 2024-06-28 NOTE — Progress Notes (Signed)
 Interventional Radiology Brief Note:  Patient with 2 new, acute infections with positive urine and respiratory cultures. Given his acute illness, will hold on gastrostomy tube placement until infection control achieved.  Team aware no procedure planned today.   Arah Aro, MS RD PA-C

## 2024-06-28 NOTE — Unmapped External Note (Signed)
 Occupational Therapy      Treatment  Patient Name: William Baxter Patient Birthdate: 05-09-1961   Patient Subjective Report - You do it    Pain Assessment Pain Context: Therapy Assessment Prior to Treatment (06/28/24 1247) Pain Assessment: NRS 0-10 (06/28/24 1247) Pain Score: 0 - No pain (06/28/24 1247) Pain Severity - NRS (Calculated): No pain (06/28/24 1247) Vital Signs Vitals Context: Resting (06/28/24 1247) Pulse: 87 (06/28/24 1247) Heart Rate Source: Monitor (06/28/24 1247) Resp: 17 (06/28/24 1247) SpO2: 96 % (06/28/24 1247)       Cognition:    Overall Cognitive Status:  Impairments impacting function   Alert:  Yes   Oriented to Personal Circumstances:  No (narrative)   Oriented to Place:  Yes   Oriented to Time:  No (narrative)   Able to Follow 1 Step Commands:  Yes   Able to Follow 2 Step Commands:  No (narrative)   Arousal:  Spontaneous eye opening Safety Awareness:    Safety Awareness:  Fair Behavioral Issues Addressed:    Behavioral Issues Addressed:  Distractibility and Confusion Cognitive Skills Training:    Insurance claims handler:  Initiation, Activities to improve sustained attention, Routine problem solving and Complex problem solving Vision:    Occulomotor:  Scanning         Balance/Activity :  Retail buyer:    Coordination Training:  Yes Gross Motor:  Yes Fine Motor:  Yes In-Hand Manipulation:  Yes Grasp and Pinch Training:    Grasp/Pinch Training:  Performed activities to improve quality of upper extremity movements, Functional activities working towards skill to form grasp and voluntary release, Performed activities to improve eye hand coordination and Performed activities to improve bi-manual manipulations                             Fall Prevention Recommendations:    Fall Prevention/ Other Recommendations:  Low bed, bed alarm, chair alarm   Communicated Via:  Verbal instruction with staff and Patient/family  education Outcomes:    OT Summary Comments:  Pt found supine in bed with wife in room and in agreement to engage in therapy. Pt with increased vocalization during session with ability to state wants and needs. Pt engaged in pattern matching activity with emphasis on: initiation of motor task, color and pattern recognition, pincer grasp and scanning requiring increased time and verbal prompting to initiate and coordinate movements. Pt left supine in bed with HOB elevated, call bell, and bed alarm.    OT Summary Plan of Care:  Continue with current plan of care               Therapy Minutes   Enter Time Entry for Time Based Charges Here: Time In : 1247 Time Out: 1321 Total Time with Patient (Min): 34 min Therapeutic Activities: 34 Total Time to be Billed (Min): 34     BURZLER, MELISSA, OT 06/28/2024

## 2024-06-29 DIAGNOSIS — Z930 Tracheostomy status: Secondary | ICD-10-CM | POA: Diagnosis not present

## 2024-06-29 DIAGNOSIS — K7682 Hepatic encephalopathy: Secondary | ICD-10-CM | POA: Diagnosis not present

## 2024-06-29 DIAGNOSIS — J9621 Acute and chronic respiratory failure with hypoxia: Secondary | ICD-10-CM | POA: Diagnosis not present

## 2024-06-29 DIAGNOSIS — Y95 Nosocomial condition: Secondary | ICD-10-CM

## 2024-06-29 DIAGNOSIS — J189 Pneumonia, unspecified organism: Secondary | ICD-10-CM | POA: Diagnosis not present

## 2024-06-29 LAB — AMMONIA: Ammonia: 91 umol/L — ABNORMAL HIGH (ref 9–35)

## 2024-06-30 DIAGNOSIS — Z930 Tracheostomy status: Secondary | ICD-10-CM | POA: Diagnosis not present

## 2024-06-30 DIAGNOSIS — K7682 Hepatic encephalopathy: Secondary | ICD-10-CM | POA: Diagnosis not present

## 2024-06-30 DIAGNOSIS — J189 Pneumonia, unspecified organism: Secondary | ICD-10-CM | POA: Diagnosis not present

## 2024-06-30 DIAGNOSIS — J9621 Acute and chronic respiratory failure with hypoxia: Secondary | ICD-10-CM | POA: Diagnosis not present

## 2024-06-30 DIAGNOSIS — Y95 Nosocomial condition: Secondary | ICD-10-CM

## 2024-06-30 LAB — URINE CULTURE: Culture: >=100000 — AB

## 2024-07-01 LAB — AMMONIA: Ammonia: 43 umol/L — ABNORMAL HIGH (ref 9–35)

## 2024-07-03 DIAGNOSIS — K7682 Hepatic encephalopathy: Secondary | ICD-10-CM | POA: Diagnosis not present

## 2024-07-03 DIAGNOSIS — Y95 Nosocomial condition: Secondary | ICD-10-CM

## 2024-07-03 DIAGNOSIS — J189 Pneumonia, unspecified organism: Secondary | ICD-10-CM | POA: Diagnosis not present

## 2024-07-03 DIAGNOSIS — Z930 Tracheostomy status: Secondary | ICD-10-CM | POA: Diagnosis not present

## 2024-07-03 DIAGNOSIS — J9621 Acute and chronic respiratory failure with hypoxia: Secondary | ICD-10-CM | POA: Diagnosis not present

## 2024-07-03 LAB — URINALYSIS, ROUTINE W REFLEX MICROSCOPIC
Bilirubin Urine: NEGATIVE
Glucose, UA: NEGATIVE mg/dL
Hgb urine dipstick: NEGATIVE
Ketones, ur: NEGATIVE mg/dL
Nitrite: NEGATIVE
Protein, ur: NEGATIVE mg/dL
Specific Gravity, Urine: 1.016 (ref 1.005–1.030)
pH: 7 (ref 5.0–8.0)

## 2024-07-03 LAB — AMMONIA: Ammonia: 46 umol/L — ABNORMAL HIGH (ref 9–35)

## 2024-07-03 NOTE — Progress Notes (Signed)
 Respiratory Progress Note Room #:5E26  Admit Date:06/08/2024  Pulmonologist:Dr [facility]  Discharge Date:  DX/HX:Acute encephalopathy, LUL nodule, hx of CVA   Isolation:  Code Status:Limited (no chest compressions)   Airway Type? (Trach/ETT):trach  Size:6  Cuffed/Cuffless? cuffless  Placement at lip?:  High Risk Airway?:  Date of insertion:06/02/2024  Trach changed:06/19/2024  PMV started:06/12/2024  Cap started:06/27/2024  Decannulated/extubated:   Ventilator ID Number: Current ventilator settings:  Current weaning orders:  Date of first wean:  Date of liberation from vent:   Non-invasive Ventilation Current settings:   Home use? Yes/no:  Frequency? HS/PRN/Continuous:   Oxygen Therapy Modality? Nasal Cannula/NRB/Trach Collar/etc.:  FiO2:21  LPM:5   Treatments Drug: Dose: Frequency: Other:                        RT Progress Date Time Important Event or Update RT Initials  06/08/2024 1721 New admit from [facility] medical center. 21% ATC, cuffed #6 secured with Sutures    06/08/2024 2315 21% ATC, sutures in place [facility]  06/09/2024 1715 21% ATC, lots of secretions, sutures in place, cuff deflated CD  06/09/2024 2133 21% ATC [facility]  06/10/2024 1805 21% ATC, pt coughs up lots of secretions, sutures in place, cuff deflated CD  06/11/2024 0410 21% ATC, same no changes OO  06/11/2024 1800 21% ATC, cuff deflated, pt coughs up lots of secretions, sutures in place CD  06/12/2024 0419 21% ATC, same no changes OO  06/12/2024 0544 21% ATC w/PMV BP  06/13/2024 0436 21% ATC.  OO  06/13/2024 0600 21% ATC w/PMV BP  06/04/2024 0511 21% ATC, PMV removed OO  06/14/2024 1336 21% ATC w/PMV EB  06/14/2024 2321 21% ATC CH  06/15/2024 1816 21% ATC w/PMV CMG  06/15/2024 2324 21% ATC with PMV, tolerating well [facility]  06/16/2024 1826 21% ATC w PMV CMG  06/16/2024 2139 21% ATC with PMV [facility]  06/17/2024 1644 21% ATC w/PMV, tolerating well CMG  06/18/2024 0341 21% ATC, REMOVED PMV TO SLEEP KN  06/18/2024 1806 21% ATC w/PMV CMG   06/19/2024 0626 21% ATC KN  06/19/2024 1006 21% ATC with PMV HL  06/19/2024 2238 21% ATC with PMV DS  06/20/2024 0559 21% ATC w/PMV BP  06/20/2024 2250 21% ATC w/PMV DS  06/21/2024 0607 21% ATC w/PMV BP  06/22/2024 0046 21% ATC w/PMV DS  06/22/24 1550 ATC 21% w/PMV.  Hold capping trials EA  06/23/24 0521 21% ATC, PMV removed at night.  SAP  06/24/24 0533 21% ATC, PMV removed at night SAP  06/24/2024 1743 ATC 21%. PMV in place. HL  06/25/2024 0512 21% ATC, REMOVED PMV TO SLEEP KN  06/25/2024 1742 ATC 21%. Sputum ordered and sent to lab.  HL  06/26/2024 0619 21% ATC, PMV REMOVED TO SLEEP KN  06/27/2024 0556 Capped on RA@1015  BP  06/27/2024 2256 Capped on RA DS  06/28/24 1740 Capped on RA EA  06/28/2024 2346 Capped on RA DS  06/30/24 0520 Capped on RA SAP  06/30/2024 1833 Cap remains in place. RA HL  07/01/2024 0612 CAPPED TO RA KN  07/01/2024 1638 Capped on Room Air CMG  07/02/2024 0604 CAPPED RA KN  07/02/2024 1810 Capped on room air CMG  07/03/2024 0558 CAPPED RA KN  07/03/2024 0804 Capped+RA BP

## 2024-07-04 DIAGNOSIS — J9621 Acute and chronic respiratory failure with hypoxia: Secondary | ICD-10-CM | POA: Diagnosis not present

## 2024-07-04 DIAGNOSIS — Y95 Nosocomial condition: Secondary | ICD-10-CM

## 2024-07-04 DIAGNOSIS — J189 Pneumonia, unspecified organism: Secondary | ICD-10-CM | POA: Diagnosis not present

## 2024-07-04 DIAGNOSIS — Z930 Tracheostomy status: Secondary | ICD-10-CM | POA: Diagnosis not present

## 2024-07-04 DIAGNOSIS — K7682 Hepatic encephalopathy: Secondary | ICD-10-CM | POA: Diagnosis not present

## 2024-07-04 LAB — URINE CULTURE: Culture: NO GROWTH

## 2024-07-04 LAB — COMPREHENSIVE METABOLIC PANEL WITH GFR
ALT: 19 U/L (ref 0–44)
AST: 25 U/L (ref 15–41)
Albumin: 2.6 g/dL — ABNORMAL LOW (ref 3.5–5.0)
Alkaline Phosphatase: 112 U/L (ref 38–126)
Anion gap: 10 (ref 5–15)
BUN: 14 mg/dL (ref 8–23)
CO2: 25 mmol/L (ref 22–32)
Calcium: 10.1 mg/dL (ref 8.9–10.3)
Chloride: 104 mmol/L (ref 98–111)
Creatinine, Ser: 1.2 mg/dL (ref 0.61–1.24)
GFR, Estimated: >60 mL/min (ref >60)
Glucose, Bld: 96 mg/dL (ref 70–99)
Potassium: 4.2 mmol/L (ref 3.5–5.1)
Sodium: 139 mmol/L (ref 135–145)
Total Bilirubin: 0.2 mg/dL (ref 0.0–1.2)
Total Protein: 9 g/dL — ABNORMAL HIGH (ref 6.5–8.1)

## 2024-07-04 MED ORDER — VANCOMYCIN HCL IN DEXTROSE 1-5 GM/200ML-% IV SOLN
1000.0000 mg | Freq: Once | INTRAVENOUS | Status: AC
  Administered 2024-07-05: 1000 mg via INTRAVENOUS

## 2024-07-04 NOTE — Progress Notes (Signed)
 IR Procedure Request - G tube placement  63 y.o. male with hx of ETOH Abuse with alcohol  withdrawal delirium, Left Pulmonary Nodule, Hepatic Steatosis, Constipation, CAD/MI/CVA, HTN, BPH, GERD. Pt initially admitted in [place] from 05/15/24 to 06/08/24 with respiratory failure and encephalopathy with elevated ammonia. [facility] and respiratory failure. Currently on trach since 06/02/24 due to poor airway protection. Pt has been receiving tube feeds via NG tube. Due to inability to assess swallow function, and need for better gastric access for feedings, IR consulted for image guided gastrostomy tube placement. Procedure initially scheduled for 8.27. 25. Due to positive urine and repository cultures procedure placed on hold. New urine culture and sensitivity pending.   Procedure tentatively scheduled for 9.3.25 Pending urine culture and sensitivity. Team made aware via telephone.   Team instructed to:  Keep Patient to be NPO after midnight Hold  anticoagulation  Antibiotic orders placed under sign and held.   IR will call patient when ready.

## 2024-07-05 ENCOUNTER — Institutional Professional Consult (permissible substitution) (HOSPITAL_COMMUNITY)

## 2024-07-05 DIAGNOSIS — Y95 Nosocomial condition: Secondary | ICD-10-CM

## 2024-07-05 DIAGNOSIS — K7682 Hepatic encephalopathy: Secondary | ICD-10-CM | POA: Diagnosis not present

## 2024-07-05 DIAGNOSIS — Z930 Tracheostomy status: Secondary | ICD-10-CM | POA: Diagnosis not present

## 2024-07-05 DIAGNOSIS — J189 Pneumonia, unspecified organism: Secondary | ICD-10-CM | POA: Diagnosis not present

## 2024-07-05 DIAGNOSIS — J9621 Acute and chronic respiratory failure with hypoxia: Secondary | ICD-10-CM | POA: Diagnosis not present

## 2024-07-05 HISTORY — PX: IR GASTROSTOMY TUBE MOD SED: IMG625

## 2024-07-05 LAB — PROTIME-INR
INR: 1.1 (ref 0.8–1.2)
Prothrombin Time: 14.5 s (ref 11.4–15.2)

## 2024-07-05 MED ORDER — MIDAZOLAM HCL 2 MG/2ML IJ SOLN
INTRAMUSCULAR | Status: AC | PRN
  Administered 2024-07-05: 1 mg via INTRAVENOUS

## 2024-07-05 MED ORDER — IOHEXOL 300 MG/ML  SOLN
50.0000 mL | Freq: Once | INTRAMUSCULAR | Status: AC | PRN
  Administered 2024-07-05: 15 mL

## 2024-07-05 MED ORDER — FENTANYL CITRATE (PF) 100 MCG/2ML IJ SOLN
INTRAMUSCULAR | Status: AC | PRN
  Administered 2024-07-05: 25 ug via INTRAVENOUS

## 2024-07-05 MED ORDER — LIDOCAINE-EPINEPHRINE 1 %-1:100000 IJ SOLN
20.0000 mL | Freq: Once | INTRAMUSCULAR | Status: AC
  Administered 2024-07-05: 10 mL via INTRADERMAL

## 2024-07-05 NOTE — Progress Notes (Signed)
 Respiratory Progress Note Room #:5E26  Admit Date:06/08/2024  Pulmonologist:Dr [facility]  Discharge Date:  DX/HX:Acute encephalopathy, LUL nodule, hx of CVA   Isolation:  Code Status:Limited (no chest compressions)   Airway Type? (Trach/ETT):trach  Size:6  Cuffed/Cuffless? cuffless  Placement at lip?:  High Risk Airway?:  Date of insertion:06/02/2024  Trach changed:06/19/2024  PMV started:06/12/2024  Cap started:06/27/2024  Decannulated/extubated:   Ventilator ID Number: Current ventilator settings:  Current weaning orders:  Date of first wean:  Date of liberation from vent:   Non-invasive Ventilation Current settings:   Home use? Yes/no:  Frequency? HS/PRN/Continuous:   Oxygen Therapy Modality? Nasal Cannula/NRB/Trach Collar/etc.:  FiO2:21  LPM:5   Treatments Drug: Dose: Frequency: Other:                        RT Progress Date Time Important Event or Update RT Initials  06/08/2024 1721 New admit from [facility]. 21% ATC, cuffed #6 secured with Sutures    06/08/2024 2315 21% ATC, sutures in place [facility]  06/09/2024 1715 21% ATC, lots of secretions, sutures in place, cuff deflated CD  06/09/2024 2133 21% ATC [facility]  06/10/2024 1805 21% ATC, pt coughs up lots of secretions, sutures in place, cuff deflated CD  06/11/2024 0410 21% ATC, same no changes OO  06/11/2024 1800 21% ATC, cuff deflated, pt coughs up lots of secretions, sutures in place CD  06/12/2024 0419 21% ATC, same no changes OO  06/12/2024 0544 21% ATC w/PMV BP  06/13/2024 0436 21% ATC.  OO  06/13/2024 0600 21% ATC w/PMV BP  06/04/2024 0511 21% ATC, PMV removed OO  06/14/2024 1336 21% ATC w/PMV EB  06/14/2024 2321 21% ATC CH  06/15/2024 1816 21% ATC w/PMV CMG  06/15/2024 2324 21% ATC with PMV, tolerating well [facility]  06/16/2024 1826 21% ATC w PMV CMG  06/16/2024 2139 21% ATC with PMV [facility]  06/17/2024 1644 21% ATC w/PMV, tolerating well CMG  06/18/2024 0341 21% ATC, REMOVED PMV TO SLEEP KN  06/18/2024 1806 21% ATC w/PMV CMG   06/19/2024 0626 21% ATC KN  06/19/2024 1006 21% ATC with PMV HL  06/19/2024 2238 21% ATC with PMV DS  06/20/2024 0559 21% ATC w/PMV BP  06/20/2024 2250 21% ATC w/PMV DS  06/21/2024 0607 21% ATC w/PMV BP  06/22/2024 0046 21% ATC w/PMV DS  06/22/24 1550 ATC 21% w/PMV.  Hold capping trials EA  06/23/24 0521 21% ATC, PMV removed at night.  SAP  06/24/24 0533 21% ATC, PMV removed at night SAP  06/24/2024 1743 ATC 21%. PMV in place. HL  06/25/2024 0512 21% ATC, REMOVED PMV TO SLEEP KN  06/25/2024 1742 ATC 21%. Sputum ordered and sent to lab.  HL  06/26/2024 0619 21% ATC, PMV REMOVED TO SLEEP KN  06/27/2024 0556 Capped on RA@1015  BP  06/27/2024 2256 Capped on RA DS  06/28/24 1740 Capped on RA EA  06/28/2024 2346 Capped on RA DS  06/30/24 0520 Capped on RA SAP  06/30/2024 1833 Cap remains in place. RA HL  07/01/2024 0612 CAPPED TO RA KN  07/01/2024 1638 Capped on Room Air CMG  07/02/2024 0604 CAPPED RA KN  07/02/2024 1810 Capped on room air CMG  07/03/2024 0558 CAPPED RA KN  07/03/2024 0804 Capped+RA BP  07/04/2024 0147 Capped on RA DS  07/04/2024 0544 Capped+RA BP  07/05/2024 0056 Capped on RA DS

## 2024-07-05 NOTE — Unmapped External Note (Signed)
 Problem: Infection  Goal: Absence of infection and prevention of transmission during hospitalization  Outcome: Progressing

## 2024-07-05 NOTE — Progress Notes (Signed)
 Respiratory Progress Note Room #:5E26  Admit Date:06/08/2024  Pulmonologist:Dr [facility]  Discharge Date:  DX/HX:Acute encephalopathy, LUL nodule, hx of CVA   Isolation:  Code Status:Limited (no chest compressions)   Airway Type? (Trach/ETT):trach  Size:6  Cuffed/Cuffless? cuffless  Placement at lip?:  High Risk Airway?:  Date of insertion:06/02/2024  Trach changed:06/19/2024  PMV started:06/12/2024  Cap started:06/27/2024  Decannulated/extubated:   Ventilator ID Number: Current ventilator settings:  Current weaning orders:  Date of first wean:  Date of liberation from vent:   Non-invasive Ventilation Current settings:   Home use? Yes/no:  Frequency? HS/PRN/Continuous:   Oxygen Therapy Modality? Nasal Cannula/NRB/Trach Collar/etc.:  FiO2:21  LPM:5   Treatments Drug: Dose: Frequency: Other:                        RT Progress Date Time Important Event or Update RT Initials  06/08/2024 1721 New admit from [facility]. 21% ATC, cuffed #6 secured with Sutures    06/08/2024 2315 21% ATC, sutures in place [facility]  06/09/2024 1715 21% ATC, lots of secretions, sutures in place, cuff deflated CD  06/09/2024 2133 21% ATC [facility]  06/10/2024 1805 21% ATC, pt coughs up lots of secretions, sutures in place, cuff deflated CD  06/11/2024 0410 21% ATC, same no changes OO  06/11/2024 1800 21% ATC, cuff deflated, pt coughs up lots of secretions, sutures in place CD  06/12/2024 0419 21% ATC, same no changes OO  06/12/2024 0544 21% ATC w/PMV BP  06/13/2024 0436 21% ATC.  OO  06/13/2024 0600 21% ATC w/PMV BP  06/04/2024 0511 21% ATC, PMV removed OO  06/14/2024 1336 21% ATC w/PMV EB  06/14/2024 2321 21% ATC CH  06/15/2024 1816 21% ATC w/PMV CMG  06/15/2024 2324 21% ATC with PMV, tolerating well [facility]  06/16/2024 1826 21% ATC w PMV CMG  06/16/2024 2139 21% ATC with PMV [facility]  06/17/2024 1644 21% ATC w/PMV, tolerating well CMG  06/18/2024 0341 21% ATC, REMOVED PMV TO SLEEP KN  06/18/2024 1806 21% ATC w/PMV CMG   06/19/2024 0626 21% ATC KN  06/19/2024 1006 21% ATC with PMV HL  06/19/2024 2238 21% ATC with PMV DS  06/20/2024 0559 21% ATC w/PMV BP  06/20/2024 2250 21% ATC w/PMV DS  06/21/2024 0607 21% ATC w/PMV BP  06/22/2024 0046 21% ATC w/PMV DS  06/22/24 1550 ATC 21% w/PMV.  Hold capping trials EA  06/23/24 0521 21% ATC, PMV removed at night.  SAP  06/24/24 0533 21% ATC, PMV removed at night SAP  06/24/2024 1743 ATC 21%. PMV in place. HL  06/25/2024 0512 21% ATC, REMOVED PMV TO SLEEP KN  06/25/2024 1742 ATC 21%. Sputum ordered and sent to lab.  HL  06/26/2024 0619 21% ATC, PMV REMOVED TO SLEEP KN  06/27/2024 0556 Capped on RA@1015  BP  06/27/2024 2256 Capped on RA DS  06/28/24 1740 Capped on RA EA  06/28/2024 2346 Capped on RA DS  06/30/24 0520 Capped on RA SAP  06/30/2024 1833 Cap remains in place. RA HL  07/01/2024 0612 CAPPED TO RA KN  07/01/2024 1638 Capped on Room Air CMG  07/02/2024 0604 CAPPED RA KN  07/02/2024 1810 Capped on room air CMG  07/03/2024 0558 CAPPED RA KN  07/03/2024 0804 Capped+RA BP  07/04/2024 0147 Capped on RA DS  07/04/2024 0544 Capped+RA BP  07/05/2024 0056 Capped on RA DS  07/05/2024 0553 Capped+RA BP

## 2024-07-05 NOTE — Procedures (Signed)
 Interventional Radiology Procedure Note  Procedure: Placement of percutaneous 50F balloon-retention gastrostomy tube.  Complications: None  EBL:  < 5 mL  Recommendations: - NPO for 4 hours after placement - Maintain G-tube to low wall suction for 4 hours after placement - May advance diet as tolerated and begin using tube 4 hours after placement - Gastropexy sutures are absorbable and do not require removal  Marliss Coots, MD Pager: [phone]

## 2024-10-17 NOTE — Discharge Summary (Signed)
 [facility] Discharge Summary  PCP: No primary care provider on file. Discharge Details   Admit date:         10/12/2024 Discharge date:        10/19/2024  Hospital LOS:    7 days DIscharge Disposition:    Active Hospital Problems   Diagnosis Date Noted POA   *Complicated UTI (urinary tract infection) 10/12/2024 Yes   Chronic obstructive lung disease (*) 10/12/2024 Yes   Diabetes mellitus (*) 10/12/2024 Yes   Dysarthria 10/12/2024 Yes   Dysphagia, unspecified 10/12/2024 Yes   History of alcohol  use disorder 10/12/2024 Not Applicable   Hypertension 10/12/2024 Yes   Parkinsonism, unspecified (*) 10/12/2024 Yes   Recurrent falls 10/12/2024 Not Applicable   UTI due to extended-spectrum beta lactamase (ESBL) producing Escherichia coli 10/12/2024 Yes   History of CVA (cerebrovascular accident) 05/16/2024 Not Applicable   BPH (benign prostatic hyperplasia) 05/16/2024 Yes   History of MI (myocardial infarction) 05/16/2024 Not Applicable    Resolved Hospital Problems   Diagnosis Date Noted Date Resolved POA   Acute encephalopathy 05/16/2024 10/17/2024 Yes      Current Discharge Medication List     START taking these medications      Details  cefdinir 300 mg capsule Commonly known as: OMNICEF  Take one capsule (300 mg dose) by mouth 2 (two) times daily for 3 days. Quantity: 6 capsule       CONTINUE these medications which have CHANGED      Details  * QUEtiapine fumarate 100 mg tablet Commonly known as: SEROQUEL What changed:  medication strength how much to take  Take one tablet (100 mg dose) by mouth at bedtime. Quantity: 60 tablet   * QUEtiapine fumarate 50 mg tablet Commonly known as: SEROQUEL What changed: You were already taking a medication with the same name, and this prescription was added. Make sure you understand how and when to take each.  Take one tablet (50 mg dose) by mouth 3 (three) times a day as needed  (agitation). Quantity: 60 tablet   * QUEtiapine fumarate 50 mg tablet Commonly known as: SEROQUEL What changed: You were already taking a medication with the same name, and this prescription was added. Make sure you understand how and when to take each.  Take one tablet (50 mg dose) by mouth 2 (two) times daily. Quantity: 60 tablet   traZODone 100 mg tablet Commonly known as: DESYREL What changed:  medication strength how much to take how to take this  Take one tablet (100 mg dose) by mouth at bedtime. Quantity: 90 tablet      * * This list has 3 medication(s) that are the same as other medications prescribed for you. Read the directions carefully, and ask your doctor or other care provider to review them with you.          CONTINUE these medications which have NOT CHANGED      Details  acetaminophen  500 mg tablet Commonly known as: TYLENOL   Take two tablets (1,000 mg dose) by mouth 2 (two) times a day as needed for Pain.   ALPRAZolam  0.5 mg tablet Commonly known as: XANAX   Take one tablet (0.5 mg dose) by mouth 3 (three) times a day as needed for Anxiety.   aspirin  81 mg chewable tablet  Chew one tablet (81 mg dose) by mouth daily.   benzonatate 100 mg capsule Commonly known as: TESSALON  Take one capsule (100 mg dose) by mouth 3 (three) times  a day as needed for Cough.   folic acid  1 mg tablet  one tablet (1 mg dose) by Tube route daily.   lactulose 10 g/15 mL solution Commonly known as: LACTULOSE,GENERLAC  30 mLs by Per G Tube route 3 (three) times a day. 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   menthol-zinc oxide 0.44-20.6 % ointment Commonly known as: CALMOSEPTINE  Apply one Application topically daily.   MULTIVITAMIN+ Liqd  Take 15 mLs by mouth daily.   nicotine 14 mg/24 hours Commonly known as: HABITROL,NICODERM  CQ  Place one patch onto the skin daily.   pantoprazole  sodium 40 mg tablet Commonly known as: PROTONIX   Take one tablet (40 mg dose) by mouth daily.   rosuvastatin calcium  40 mg tablet Commonly known as: CRESTOR  Take one tablet (40 mg dose) by mouth daily. for cholesterol   sertraline 50 mg tablet Commonly known as: ZOLOFT  Take one tablet (50 mg dose) by mouth daily.   tamsulosin  0.4 mg Caps Commonly known as: FLOMAX   Take one capsule (0.4 mg dose) by mouth daily.   thiamine  100 mg tablet Commonly known as: VITAMIN B-1  one tablet (100 mg dose) by Tube route daily.   zinc oxide 20% ointment  Apply one Application topically as needed for Dry Skin.      * You might also be taking other medications not listed above. If you have questions about any of your other medications, talk to the person who prescribed them or your Primary Care Provider.          STOP taking these medications    amantadine 50 mg/5 mL oral solution Commonly known as: SYMMETREL   cephALEXin 500 mg capsule Commonly known as: KEFLEX   naltrexone 50 mg tablet Commonly known as: REVIA   polyethylene glycol 17 g packet Commonly known as: MIRALAX   Pramoxine-HC 1-1 % Oint        Reason for Medication Changes: started on increased dose of trazodone for sleep and increased doses of seroquel by tele-psych. Started on 3 more days of cefdinir to complete a total of 10 days of abx for uti.   Hospital Course  Physicians involved in care during this hospitalization Attending Provider: Darleene Norleen Railing, MD Attending Provider: Candido Pebbles, MD Attending Provider: Ricka JINNY Siren, MD Attending Provider: Balinda GORMAN Short, DO Admitting Provider: Darleene Norleen Railing, MD Consulting Physician: Athena Consult To [facility] [employer] Consulting Physician: Candido Pebbles, MD Consulting Physician: Ricka JINNY Siren, MD Consulting Physician: Athena Consult To Psychiatry Consulting  Physician: Jeanmarie Forrest Gables, PA-C Consulting Physician: Katherene Consult To [employer] Consulting Physician: Athena Consult To Psychiatry Consulting Physician: Athena Consult To [facility] Consulting Physician: Athena Consult To Psychiatry Consulting Physician: Jeanmarie Forrest Mvula, PA-C  Indication for Admission: confusion   History of Present Illness (from H&P): [facility] is a 63 y.o. male who presented to the ED with concerns for confusion and increased unsteadiness.  Patient has history of alcoholic cirrhosis and prior stroke.  He had a major hospitalization in July 2020 25 which required prolonged intubation and subsequently required LTAC placement followed by additional rehab.  He has been at home for the past several months and under the care of his wife.  At baseline patient is able to carry on relatively normal conversations and laugh and  joke.  He is able to walk with a walker but is unsteady and needs some assistance with his ADLs although he is able to participate and help with this.   For the past 5 days patient has been having increased confusion along with hallucinating and increased unsteadiness.  He came to the emergency department on 10/11/1999 2025 and was diagnosed with urinary tract infection and discharged home with prescription for Keflex.  Unfortunately there was some confusion around this and it looks like the Keflex went to a different pharmacy and ultimately family was not able to get this.  Patient has continued to have confusion since that time.  Wife denies him having any fever or vomiting and states that he still has his strength although he is more unsteady and he has still been eating and drinking.  Patient does have a PEG tube in place which his wife supplements his oral nutrition with Osmolite 1.5.   In the emergency department patient was noted to have temperature of 99.1 with heart rate of 110.  White blood cell count was normal.  Blood cultures  were obtained.  Urine culture from 10/11/1999 2025 is showing 100,000 and E. coli but sensitivities are pending.   Patient is accompanied by wife who assisted with history gathering.  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  His p.m. Seroquel was increased from 50 to 100 mg.  He also has as needed Seroquel and twice daily scheduled seroquel.  He continues home Zoloft and trazodone. Trazadone dose was increased to 100 mg qhs.  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 --On day of discharge, pt was at baseline for greater than 24 hrs. Psych has signed off. He can f/u with outpt psych at discharge  -during hospitalization, he was also seen by palliative care. Wife considering palliative care/hospice support at home.    E. coli UTI Fortunately this is not an ESBL UTI.  He was treated with 7 day course of rocephin .  Wife concerned that pt should be on a longer course of abx. Will extend antibiotics for a total of 10 days. Pt will be transitioned to oral cefdinir at discharge.   History of cirrhosis Continue lactulose.  No evidence of volume overload.     Seizure disorder Continue Keppra    Hyperlipidemia Continue Crestor   Status post PEG Patient has a PEG and wife states he continues to receive intermittent tube feeds when he is not eating well.  Tube feeds have been continued here.   Hypokalemia Repleted   Tobacco abuse Continue NicoDerm   History of stroke Wife states that he does have some weakness and should use a cane or walker to steady his gait.  Continue aspirin , Crestor.   BPH Continue Flomax    Hyperlipidemia Continue Crestor      On day of discharge, pt was hemodynamically stable. He can f/u with PCP in 1 week and with psych in 1-2 weeks.     Bedside Procedures   No orders found     [facility]   Discharge Procedure Orders  Follow-up with Primary Care Physician  Standing Status: Future  Referral Priority: Routine Referral Type: Consultation  Referral Reason: Evaluate and Return  Number of Visits Requested: 1 Expiration Date: 04/14/25   Cardiac diet (heart healthy)   Activity as tolerated   Discharge  instructions  Order Comments: -continue cefdinir for 3 days -f/u closely with PCP  -continue seroquel as recommended by tele-psych    Unresulted Labs     Order Current Status   Culture, Blood Blood Arm, Left Preliminary result   Culture, Blood Blood Arm, Right Preliminary result          Wound Care Recommendations: None     Recommendations to physicians:   Followup PCP 1 week  Followup psych as outpt   Recommendations for followup labs and/or imaging: repeat CBC, BMP in 1 week   Rehab/Home health orders:          Code Status:   No CPR   Time spent in discharge process:  35 minutes  This note was dictated with voice recognition software. Similar sounding words can inadvertently be transcribed and may not be corrected upon review  Electronically signed: Balinda GORMAN Short, DO 10/19/2024 / 10:55 AM  *Some images could not be shown.

## 2024-10-28 NOTE — ED Provider Notes (Addendum)
 [facility]  ED Progress Note   I was given report on this patient at end of shift handoff from Rosina Franc, NP, at 5pm, in case anything is needed for him until he is transferred as planned.  He is reportedly awaiting transport that is scheduled at 2 AM tomorrow morning.  Nursing staff is reporting that he seems to be more agitated and picking at his bed and lines.  He reportedly has a history of dementia and has been on hospice.  He is awaiting transfer to [facility] for G-tube placement.  We reportedly do not have G-tubes available in the emergency department but a Foley catheter was placed in the interim until it can be replaced by an actual G-tube.  He does appear to be having some agitation and trying to pull out out of his bed and picking at his lines.  In order to keep him calm and from harming himself including pulling out his lines including the Foley catheter that was placed as a G-tube; lorazepam  x 1 will be ordered.  He does appear to be grossly stable at this time.   XR Tube Eval W Contrast  Final Result  IMPRESSION:  No extraluminal contrast; there is contrast within the stomach and proximal duodenum. Balloon evident within the gastric body.          Electronically Signed by: William Murders, DO on 10/28/2024 3:25 PM       Final diagnoses:  Encounter for feeding tube placement          William DELENA Skene, FNP 10/28/24 2031

## 2024-10-28 NOTE — ED Provider Notes (Signed)
 [facility]  ED Progress Note    End of shift. ED attending, Dr. Link, will manage patient as needed until he is transferred.  Electronically Signed by:   Lamar DELENA Skene, FNP 10/28/24 2351

## 2024-10-30 ENCOUNTER — Other Ambulatory Visit (HOSPITAL_COMMUNITY): Payer: Self-pay

## 2024-10-30 DIAGNOSIS — Z931 Gastrostomy status: Secondary | ICD-10-CM

## 2024-11-03 ENCOUNTER — Ambulatory Visit (HOSPITAL_COMMUNITY)
Admission: RE | Admit: 2024-11-03 | Discharge: 2024-11-03 | Disposition: A | Discharge status: Home / Self Care | Source: Ambulatory Visit | Attending: Rheumatology | Admitting: Rheumatology

## 2024-11-03 DIAGNOSIS — Z931 Gastrostomy status: Secondary | ICD-10-CM

## 2024-11-03 DIAGNOSIS — Z431 Encounter for attention to gastrostomy: Secondary | ICD-10-CM | POA: Insufficient documentation

## 2024-11-03 HISTORY — PX: IR REPLC GASTRO/COLONIC TUBE PERCUT W/FLUORO: IMG2333

## 2024-11-03 MED ORDER — LIDOCAINE VISCOUS HCL 2 % MT SOLN
OROMUCOSAL | Status: AC
  Filled 2024-11-03: qty 15

## 2024-11-03 MED ORDER — IOHEXOL 300 MG/ML  SOLN
50.0000 mL | Freq: Once | INTRAMUSCULAR | Status: AC | PRN
  Administered 2024-11-03: 30 mL

## 2024-11-03 NOTE — Procedures (Signed)
 Interventional Radiology Procedure:   Indications: History of dislodged G-tube and needs a new tube  Procedure: G-tube replacement  Findings: 12 Fr foley was in place.  Tract was dilated with accommodate a 16 Fr tube.    Complications: None     EBL: Minimal  Plan:  G-tube is ready to use.   Florina Glas R. Philip, MD  Pager: [phone]
